# Patient Record
Sex: Female | Born: 1950 | Race: White | Hispanic: No | Marital: Married | Smoking: Never smoker
Health system: Southern US, Community
[De-identification: ages and names within clinical notes are randomized; demographics above are authoritative.]

## PROBLEM LIST (undated history)

## (undated) DIAGNOSIS — G809 Cerebral palsy, unspecified: Secondary | ICD-10-CM

## (undated) DIAGNOSIS — I509 Heart failure, unspecified: Secondary | ICD-10-CM

## (undated) DIAGNOSIS — E119 Type 2 diabetes mellitus without complications: Secondary | ICD-10-CM

## (undated) DIAGNOSIS — I10 Essential (primary) hypertension: Secondary | ICD-10-CM

## (undated) HISTORY — PX: MOUTH SURGERY: SHX715

## (undated) HISTORY — PX: ABDOMINAL HYSTERECTOMY: SHX81

## (undated) HISTORY — PX: OTHER SURGICAL HISTORY: SHX169

---

## 1998-01-05 ENCOUNTER — Other Ambulatory Visit: Admission: RE | Admit: 1998-01-05 | Discharge: 1998-01-05 | Payer: Self-pay | Admitting: Family Medicine

## 1998-01-11 ENCOUNTER — Emergency Department (HOSPITAL_COMMUNITY): Admission: EM | Admit: 1998-01-11 | Discharge: 1998-01-11 | Payer: Self-pay | Admitting: Emergency Medicine

## 1998-01-16 ENCOUNTER — Other Ambulatory Visit: Admission: RE | Admit: 1998-01-16 | Discharge: 1998-01-16 | Payer: Self-pay | Admitting: Family Medicine

## 1998-03-13 ENCOUNTER — Ambulatory Visit (HOSPITAL_COMMUNITY): Admission: RE | Admit: 1998-03-13 | Discharge: 1998-03-13 | Payer: Self-pay | Admitting: Family Medicine

## 1998-05-25 ENCOUNTER — Other Ambulatory Visit: Admission: RE | Admit: 1998-05-25 | Discharge: 1998-05-25 | Payer: Self-pay | Admitting: *Deleted

## 1998-05-28 ENCOUNTER — Encounter: Admission: RE | Admit: 1998-05-28 | Discharge: 1998-05-28 | Payer: Self-pay | Admitting: Obstetrics

## 1998-05-28 ENCOUNTER — Other Ambulatory Visit: Admission: RE | Admit: 1998-05-28 | Discharge: 1998-05-28 | Payer: Self-pay | Admitting: Obstetrics

## 1998-06-25 ENCOUNTER — Encounter: Admission: RE | Admit: 1998-06-25 | Discharge: 1998-06-25 | Payer: Self-pay | Admitting: Obstetrics

## 1998-07-07 ENCOUNTER — Ambulatory Visit (HOSPITAL_COMMUNITY): Admission: RE | Admit: 1998-07-07 | Discharge: 1998-07-07 | Payer: Self-pay | Admitting: Family Medicine

## 1998-07-07 ENCOUNTER — Encounter: Payer: Self-pay | Admitting: Family Medicine

## 1998-09-19 ENCOUNTER — Ambulatory Visit: Admission: RE | Admit: 1998-09-19 | Discharge: 1998-09-19 | Payer: Self-pay | Admitting: Otolaryngology

## 1998-11-09 ENCOUNTER — Ambulatory Visit: Admission: RE | Admit: 1998-11-09 | Discharge: 1998-11-09 | Payer: Self-pay | Admitting: Otolaryngology

## 1998-12-30 ENCOUNTER — Emergency Department (HOSPITAL_COMMUNITY): Admission: EM | Admit: 1998-12-30 | Discharge: 1998-12-30 | Payer: Self-pay | Admitting: Emergency Medicine

## 1998-12-30 ENCOUNTER — Encounter: Payer: Self-pay | Admitting: Emergency Medicine

## 2002-01-04 ENCOUNTER — Ambulatory Visit (HOSPITAL_COMMUNITY): Admission: RE | Admit: 2002-01-04 | Discharge: 2002-01-04 | Payer: Self-pay | Admitting: Neurology

## 2002-01-04 ENCOUNTER — Encounter: Payer: Self-pay | Admitting: Neurology

## 2003-01-02 ENCOUNTER — Other Ambulatory Visit: Admission: RE | Admit: 2003-01-02 | Discharge: 2003-01-02 | Payer: Self-pay | Admitting: Family Medicine

## 2003-01-02 ENCOUNTER — Ambulatory Visit (HOSPITAL_COMMUNITY): Admission: RE | Admit: 2003-01-02 | Discharge: 2003-01-02 | Payer: Self-pay | Admitting: Family Medicine

## 2003-10-07 ENCOUNTER — Encounter (INDEPENDENT_AMBULATORY_CARE_PROVIDER_SITE_OTHER): Payer: Self-pay | Admitting: Interventional Cardiology

## 2003-10-07 ENCOUNTER — Ambulatory Visit (HOSPITAL_COMMUNITY): Admission: RE | Admit: 2003-10-07 | Discharge: 2003-10-07 | Payer: Self-pay | Admitting: Family Medicine

## 2003-11-21 ENCOUNTER — Ambulatory Visit (HOSPITAL_COMMUNITY): Admission: RE | Admit: 2003-11-21 | Discharge: 2003-11-21 | Payer: Self-pay | Admitting: Obstetrics and Gynecology

## 2003-12-19 ENCOUNTER — Ambulatory Visit (HOSPITAL_COMMUNITY): Admission: RE | Admit: 2003-12-19 | Discharge: 2003-12-19 | Payer: Self-pay | Admitting: Family Medicine

## 2003-12-30 ENCOUNTER — Ambulatory Visit (HOSPITAL_COMMUNITY): Admission: RE | Admit: 2003-12-30 | Discharge: 2003-12-30 | Payer: Self-pay | Admitting: Cardiology

## 2004-01-01 ENCOUNTER — Observation Stay (HOSPITAL_COMMUNITY): Admission: RE | Admit: 2004-01-01 | Discharge: 2004-01-02 | Payer: Self-pay | Admitting: Obstetrics and Gynecology

## 2004-01-01 ENCOUNTER — Encounter (INDEPENDENT_AMBULATORY_CARE_PROVIDER_SITE_OTHER): Payer: Self-pay | Admitting: Specialist

## 2004-01-21 ENCOUNTER — Ambulatory Visit (HOSPITAL_BASED_OUTPATIENT_CLINIC_OR_DEPARTMENT_OTHER): Admission: RE | Admit: 2004-01-21 | Discharge: 2004-01-21 | Payer: Self-pay | Admitting: Family Medicine

## 2004-07-12 ENCOUNTER — Emergency Department (HOSPITAL_COMMUNITY): Admission: EM | Admit: 2004-07-12 | Discharge: 2004-07-12 | Payer: Self-pay | Admitting: Emergency Medicine

## 2004-11-29 ENCOUNTER — Ambulatory Visit: Payer: Self-pay | Admitting: Family Medicine

## 2004-12-13 ENCOUNTER — Ambulatory Visit: Payer: Self-pay | Admitting: Family Medicine

## 2005-02-14 ENCOUNTER — Ambulatory Visit: Payer: Self-pay | Admitting: Family Medicine

## 2008-12-12 ENCOUNTER — Emergency Department (HOSPITAL_COMMUNITY): Admission: EM | Admit: 2008-12-12 | Discharge: 2008-12-12 | Payer: Self-pay | Admitting: Emergency Medicine

## 2010-03-31 ENCOUNTER — Emergency Department (HOSPITAL_COMMUNITY): Admission: EM | Admit: 2010-03-31 | Discharge: 2010-03-31 | Payer: Self-pay | Admitting: Emergency Medicine

## 2010-04-01 ENCOUNTER — Observation Stay (HOSPITAL_COMMUNITY): Admission: EM | Admit: 2010-04-01 | Discharge: 2010-04-01 | Payer: Self-pay | Admitting: Emergency Medicine

## 2010-04-26 ENCOUNTER — Emergency Department (HOSPITAL_COMMUNITY): Admission: EM | Admit: 2010-04-26 | Discharge: 2010-04-26 | Payer: Self-pay | Admitting: Emergency Medicine

## 2010-07-29 ENCOUNTER — Encounter: Admission: RE | Admit: 2010-07-29 | Payer: Self-pay | Source: Home / Self Care | Admitting: Neurology

## 2010-10-21 LAB — CBC
MCH: 32.8 pg (ref 26.0–34.0)
MCH: 32.8 pg (ref 26.0–34.0)
MCH: 33 pg (ref 26.0–34.0)
MCHC: 33.7 g/dL (ref 30.0–36.0)
MCHC: 33.9 g/dL (ref 30.0–36.0)
MCHC: 34 g/dL (ref 30.0–36.0)
MCV: 96.4 fL (ref 78.0–100.0)
Platelets: 205 10*3/uL (ref 150–400)
Platelets: 217 10*3/uL (ref 150–400)
RBC: 3.91 MIL/uL (ref 3.87–5.11)
RBC: 4.05 MIL/uL (ref 3.87–5.11)
RBC: 4.18 MIL/uL (ref 3.87–5.11)
RBC: 4.51 MIL/uL (ref 3.87–5.11)
RDW: 13.1 % (ref 11.5–15.5)
RDW: 13.2 % (ref 11.5–15.5)
WBC: 15.5 10*3/uL — ABNORMAL HIGH (ref 4.0–10.5)
WBC: 7.9 10*3/uL (ref 4.0–10.5)

## 2010-10-21 LAB — URINE CULTURE
Colony Count: 10000
Culture  Setup Time: 201108240546
Culture  Setup Time: 201108251535
Culture  Setup Time: 201109191610

## 2010-10-21 LAB — DIFFERENTIAL
Basophils Relative: 0 % (ref 0–1)
Eosinophils Absolute: 0 10*3/uL (ref 0.0–0.7)
Eosinophils Absolute: 0.1 10*3/uL (ref 0.0–0.7)
Eosinophils Absolute: 0.2 10*3/uL (ref 0.0–0.7)
Eosinophils Relative: 3 % (ref 0–5)
Lymphocytes Relative: 15 % (ref 12–46)
Lymphocytes Relative: 32 % (ref 12–46)
Lymphs Abs: 2.3 10*3/uL (ref 0.7–4.0)
Lymphs Abs: 2.5 10*3/uL (ref 0.7–4.0)
Monocytes Absolute: 0.7 10*3/uL (ref 0.1–1.0)
Monocytes Absolute: 1.9 10*3/uL — ABNORMAL HIGH (ref 0.1–1.0)
Monocytes Relative: 9 % (ref 3–12)
Neutro Abs: 11.2 10*3/uL — ABNORMAL HIGH (ref 1.7–7.7)
Neutro Abs: 4.4 10*3/uL (ref 1.7–7.7)
Neutrophils Relative %: 68 % (ref 43–77)
Neutrophils Relative %: 72 % (ref 43–77)

## 2010-10-21 LAB — COMPREHENSIVE METABOLIC PANEL
BUN: 19 mg/dL (ref 6–23)
CO2: 27 mEq/L (ref 19–32)
Calcium: 9 mg/dL (ref 8.4–10.5)
Chloride: 107 mEq/L (ref 96–112)
GFR calc non Af Amer: >60 mL/min (ref >60)
Potassium: 3.7 mEq/L (ref 3.5–5.1)
Total Bilirubin: 0.5 mg/dL (ref 0.3–1.2)

## 2010-10-21 LAB — GLUCOSE, CAPILLARY
Glucose-Capillary: 115 mg/dL — ABNORMAL HIGH (ref 70–99)
Glucose-Capillary: 121 mg/dL — ABNORMAL HIGH (ref 70–99)

## 2010-10-21 LAB — BASIC METABOLIC PANEL
BUN: 13 mg/dL (ref 6–23)
BUN: 23 mg/dL (ref 6–23)
CO2: 25 mEq/L (ref 19–32)
CO2: 26 mEq/L (ref 19–32)
Calcium: 8.5 mg/dL (ref 8.4–10.5)
Chloride: 94 mEq/L — ABNORMAL LOW (ref 96–112)
Chloride: 98 mEq/L (ref 96–112)
Creatinine, Ser: 0.7 mg/dL (ref 0.4–1.2)
Creatinine, Ser: 0.72 mg/dL (ref 0.4–1.2)
GFR calc Af Amer: >60 mL/min (ref >60)
GFR calc non Af Amer: >60 mL/min (ref >60)
Glucose, Bld: 173 mg/dL — ABNORMAL HIGH (ref 70–99)
Glucose, Bld: 241 mg/dL — ABNORMAL HIGH (ref 70–99)

## 2010-10-21 LAB — POCT I-STAT, CHEM 8
HCT: 42 % (ref 36.0–46.0)
Hemoglobin: 14.3 g/dL (ref 12.0–15.0)
Potassium: 3.5 mEq/L (ref 3.5–5.1)
Sodium: 131 mEq/L — ABNORMAL LOW (ref 135–145)
TCO2: 25 mmol/L (ref 0–100)

## 2010-10-21 LAB — HEPATIC FUNCTION PANEL
Albumin: 2.7 g/dL — ABNORMAL LOW (ref 3.5–5.2)
Alkaline Phosphatase: 78 U/L (ref 39–117)
Indirect Bilirubin: 0.7 mg/dL (ref 0.3–0.9)
Total Protein: 6.6 g/dL (ref 6.0–8.3)

## 2010-10-21 LAB — POCT CARDIAC MARKERS
CKMB, poc: <1 ng/mL — ABNORMAL LOW (ref 1.0–8.0)
Myoglobin, poc: 150 ng/mL (ref 12–200)
Myoglobin, poc: 340 ng/mL (ref 12–200)

## 2010-10-21 LAB — URINALYSIS, ROUTINE W REFLEX MICROSCOPIC
Bilirubin Urine: NEGATIVE
Bilirubin Urine: NEGATIVE
Bilirubin Urine: NEGATIVE
Glucose, UA: 100 mg/dL — AB
Glucose, UA: NEGATIVE mg/dL
Glucose, UA: NEGATIVE mg/dL
Hgb urine dipstick: NEGATIVE
Hgb urine dipstick: NEGATIVE
Ketones, ur: NEGATIVE mg/dL
Nitrite: POSITIVE — AB
Nitrite: POSITIVE — AB
Protein, ur: 100 mg/dL — AB
Protein, ur: 100 mg/dL — AB
Protein, ur: 30 mg/dL — AB
Protein, ur: 30 mg/dL — AB
Specific Gravity, Urine: 1.021 (ref 1.005–1.030)
Urobilinogen, UA: 0.2 mg/dL (ref 0.0–1.0)
Urobilinogen, UA: 1 mg/dL (ref 0.0–1.0)

## 2010-10-21 LAB — URINE MICROSCOPIC-ADD ON

## 2010-10-21 LAB — BRAIN NATRIURETIC PEPTIDE: Pro B Natriuretic peptide (BNP): <30 pg/mL (ref 0.0–100.0)

## 2010-10-21 LAB — LIPASE, BLOOD: Lipase: 23 U/L (ref 11–59)

## 2010-11-16 LAB — COMPREHENSIVE METABOLIC PANEL
AST: 21 U/L (ref 0–37)
Albumin: 3.5 g/dL (ref 3.5–5.2)
Alkaline Phosphatase: 112 U/L (ref 39–117)
BUN: 15 mg/dL (ref 6–23)
Chloride: 105 mEq/L (ref 96–112)
GFR calc Af Amer: >60 mL/min (ref >60)
Potassium: 3.6 mEq/L (ref 3.5–5.1)
Total Bilirubin: 0.6 mg/dL (ref 0.3–1.2)
Total Protein: 7.3 g/dL (ref 6.0–8.3)

## 2010-11-16 LAB — DIFFERENTIAL
Basophils Absolute: 0 10*3/uL (ref 0.0–0.1)
Basophils Relative: 0 % (ref 0–1)
Eosinophils Relative: 3 % (ref 0–5)
Lymphocytes Relative: 24 % (ref 12–46)
Monocytes Absolute: 0.8 10*3/uL (ref 0.1–1.0)
Monocytes Relative: 11 % (ref 3–12)
Neutro Abs: 4.4 10*3/uL (ref 1.7–7.7)

## 2010-11-16 LAB — POCT CARDIAC MARKERS
CKMB, poc: <1 ng/mL — ABNORMAL LOW (ref 1.0–8.0)
Troponin i, poc: <0.05 ng/mL (ref 0.00–0.09)

## 2010-11-16 LAB — CBC
Platelets: 278 10*3/uL (ref 150–400)
RDW: 13.6 % (ref 11.5–15.5)
WBC: 7.1 10*3/uL (ref 4.0–10.5)

## 2010-11-16 LAB — APTT: aPTT: 29 seconds (ref 24–37)

## 2010-12-24 NOTE — Cardiovascular Report (Signed)
NAME:  Destiny Cole, Destiny Cole                        ACCOUNT NO.:  [id number]   MEDICAL RECORD NO.:  [id number]                   PATIENT TYPE:  OIB   LOCATION:  2899                                 FACILITY:  [facility]   PHYSICIAN:  Armanda Magic, M.D.                  DATE OF BIRTH:  September 18, 1950   DATE OF PROCEDURE:  12/30/2003  DATE OF DISCHARGE:  12/30/2003                              CARDIAC CATHETERIZATION   PROCEDURE:  Left heart catheterization and coronary angiography, left  ventriculography.   SURGEON:  Armanda Magic, M.D.   REFERRING PHYSICIAN:  Turkey R. Rankins, M.D.   INDICATIONS:  Shortness of breath and abnormal Cardiolite.   COMPLICATIONS:  None.   INTRAVENOUS ACCESS:  IV access through a right femoral artery 6 French  sheath.   INDICATIONS:  This is a 60 year old morbidly obese white female who has been  having problems with shortness of breath and atypical chest pain.  She is a  previous Phen-Phen user and 2-D echocardiogram was essentially normal except  for mild LVH.  She had an abnormal Adenosine Cardiolite study that showed an  anterior wall defect that was reversible and now presents for heart  catheterization for preop clearance prior to hysterectomy.   DESCRIPTION OF PROCEDURE:  The patient was brought to the cardiac  catheterization laboratory in the fasting nonsedated state.  Informed  consent was obtained.  The patient was connected to continuous heart rate  and pulse oximetry monitoring and intermittent blood pressure monitoring,  intermittent blood pressure monitoring.  Her right groin was prepped and  draped in the sterile fashion.  1% Xylocaine was used for local anesthesia.  Using the modified Seldinger technique, a 6 French sheath was placed in the  right femoral artery.  Under fluoroscopic guidance a 6 [place] JL4 catheter  was placed in the left coronary artery.  Multiple cine films were taken in a  30 degree RAO and 40 degree LAO views.  The  catheter was then exchanged out  over a guidewire for a 6 [place] JL4 catheter which was placed under  fluoroscopic guidance in the right coronary artery.  Multiple cine films  were taken in 30 degree RAO and 40 degree LAO views.  This catheter was then  removed over a guidewire and a 6 French angled pigtail catheter was placed  in the left ventricular cavity under fluoroscopic guidance.  Left  ventriculography was performed in the 30 degree RAO views, and a total of 30  mL of contrast at 15 mL per second.  The catheter was pulled back across the  aortic valve and significant gradient was noted at the end of the procedure.  All catheters and sheaths were removed.  Manual compression was performed  until adequate hemostasis was obtained.  The patient was transferred back to  her room in stable condition.   RESULTS:  1. The left main coronary artery is widely patent  and bifurcates into a left     anterior descending artery and left circumflex artery.  2. The left anterior descending artery is widely patent throughout its     course to the apex giving rise to two diagonal branches both of which are     widely patent.  3. The left circumflex is widely patent and traverses the AV groove.  It     gives rise to a very large obtuse marginal branch which trifurcates     distally and is widely patent.  It gives rise to a second obtuse marginal     branch which is very small and patent.  4. The right coronary artery is widely patent throughout its course and     bifurcates distally into the posterior descending artery and posterior     lateral artery.   LEFT VENTRICULOGRAPHY:  The left ventriculography shows normal left  ventricular systolic function.  LV pressure 124/16 mmHg.  Aortic pressure  124/48 mmHg.  LVEDP is 18 mmHg.   ASSESSMENT:  1. Shortness of breath most likely secondary to morbid obesity.  2. Normal coronary arteries.  3. Normal left ventricular function.   PLAN:  Discharge to  home after bed rest and IV fluids.  Follow up with Dr.  Mayford Knife in 2 weeks for groin check.  She is cleared for her hysterectomy.                                               Armanda Magic, M.D.    TT/MEDQ  D:  12/30/2003  T:  12/31/2003  Job:  [id number]   cc:   Fanny Dance. Rankins, M.D.  [address]  [place]  [place] [address]  Fax: [phone]

## 2010-12-24 NOTE — H&P (Signed)
NAME:  Destiny Cole, Destiny Cole                          ACCOUNT NO.:  [id number]   MEDICAL RECORD NO.:  [id number]                   PATIENT TYPE:   LOCATION:                                       FACILITY:   PHYSICIAN:  Osborn Coho, M.D.                DATE OF BIRTH:   DATE OF ADMISSION:  DATE OF DISCHARGE:                                HISTORY & PHYSICAL   CHIEF COMPLAINT:  Postmenopausal bleeding.   HISTORY:  Destiny Cole is a 60 year old gravida 3 para 3-0-0-2 with last  menstrual period May 2002 with postmenopausal bleeding that started in the  middle of April 2005.  The workup for her postmenopausal bleeding revealed  complex hyperplasia with atypia.  The patient reports only some spotting for  the last 3 days on her May 19 visit but prior to that time she had some  bleeding like a period.  She denies any significant weight changes or change  in appetite or bowel movements.   PAST OBSTETRICAL HISTORY:  Normal spontaneous vaginal delivery full-term x3  (one was stillborn).   PAST GYNECOLOGICAL HISTORY:  History of regular menses prior to menopause.  Postmenopausal since May 2002, never used hormonal replacement therapy, and  stopped having hot flashes about 3 months ago.  The patient denies any  history of gonorrhea, chlamydia, abnormal Pap smears.  Her last mammogram  was in May 2004 and was normal per patient report.   PAST MEDICAL HISTORY:  1. Hypertension.  2. Diabetes.  3. GERD.  4. Hypercholesterolemia.  5. Morbid obesity.  6. Depression.  7. Cerebral palsy (affecting right side).  8. History of kidney stones.   MEDICINES:  Insulin, Glucophage, Actos, amitriptyline, omeprazole, Altace,  Prozac, furosemide, potassium chloride, Lipitor.  A medication sheet will be  included with her record.   PAST SURGICAL HISTORY:  1. Surgery on tendons of the right lower extremity and right wrist secondary     to cerebral palsy.  2. Lithotripsy 12 years ago secondary to history of  kidney stones.  3. Laparoscopic bilateral tubal ligation.   ALLERGIES:  SULFA (hives).   SOCIAL HISTORY:  Denies cigarette use, alcohol use, or drug use.  The  patient lives alone.   FAMILY HISTORY:  Hypertension, diabetes, heart disease on both sides of the  family.  Mother had breast cancer and lung cancer and bipolar disorder.   REVIEW OF SYMPTOMS:  Denies chest pain, fever, chills, nausea, vomiting,  diarrhea, or genitourinary problems.  The patient does report a history of  shortness of breath upon walking distances but no chest pain.   PHYSICAL EXAMINATION:  HEENT:  Within normal limits, thyroid not enlarged.  HEART:  Rate and rhythm are regular.  CHEST:  Clear to auscultation bilaterally.  BREASTS:  No masses, discharge, skin changes, or nipple retraction.  BACK:  No CVA tenderness.  ABDOMEN:  Obese and nontender, no palpable masses.  EXTREMITIES:  No calf tenderness.  There is some slight difficulty moving  the right leg secondary to cerebral palsy.  PELVIC:  External genitalia within normal limits.  Cervix nontender.  Uterus  difficult to palpate on exam secondary to habitus.   IMAGING AND STUDIES:  Endometrial biopsy on November 26, 2003 revealed simple  and complex hyperplasia with atypia.  Ultrasound on November 21, 2003 revealed  a uterus measuring 7.4 x 5.0 x 4.1 cm.  The endometrial lining was thickened  measuring 1.3 cm.  The left ovary contained a small simple cyst measuring  1.6 x 0.9 x 1.3 cm.  The right ovary was not directly visualized but there  were no adnexal masses noted.  No free fluid.   ASSESSMENT AND PLAN:  Destiny Cole is a 60 year old gravida 3 para 2 with  postmenopausal bleeding, found to have complex hyperplasia with atypia.  The  patient is scheduled to undergo a hysterectomy with possible bilateral  salpingo-oophorectomy on Jan 01, 2004.  The risks, benefits, and  alternatives including but not limited to bleeding, infection, and injury  have been  discussed with the patient.  The patient verbalized understanding  and consent signed and witnessed.  Preoperative labs to be done.  Medical  clearance has been obtained from the patient's primary medical doctor, Dr.  Luciana Axe, and her cardiologist, Dr. Mayford Knife.  The patient's endocrinologist is  Dr. Leslie Dales.                                               Osborn Coho, M.D.    AR/MEDQ  D:  01/01/2004  T:  01/01/2004  Job:  [id number]

## 2010-12-24 NOTE — H&P (Signed)
NAME:  Destiny Cole, Destiny Cole                        ACCOUNT NO.:  [id number]   MEDICAL RECORD NO.:  [id number]                   PATIENT TYPE:  OIB   LOCATION:  2899                                 FACILITY:  [facility]   PHYSICIAN:  Armanda Magic, M.D.                  DATE OF BIRTH:  12/29/1950   DATE OF ADMISSION:  12/30/2003  DATE OF DISCHARGE:  12/30/2003                                HISTORY & PHYSICAL   PRIMARY CARE PHYSICIAN:  Jillyn Hidden A. Rankin, M.D.   CHIEF COMPLAINT:  Abnormal Cardiolite study.   HISTORY OF PRESENT ILLNESS:  Destiny Cole is a 60 year old morbidly obese  female with prior history of Fen-Phen use, was initially evaluated by Dr.  Mayford Knife in the office in March of 2005, due to complaints of atypical chest  pain under her left arm, sharp shooting pain, duration a few seconds and is  gone without radiation.  Usually occurs with exertion, has increased in  frequency over the past eight months prior to her evaluation.  Her only  other problem is related to lower extremity edema.  Because of her risk  factors of obesity, hypertension and diabetes Dr. Mayford Knife ordered an  adenosine Cardiolite study.  This study did reveal an area of reversibility  in the anterior wall consistent with LAD stenosis and anterior wall ischemia  with normal LV function.  Based on the results of that study, Dr. Mayford Knife has  recommended diagnostic coronary angiography to be done at [facility]. [facility].   REVIEW OF SYMPTOMS:  Please refer to history of present illness.  She does  have occasional headaches and some difficulty swallowing and dizzy spells  without visual disturbances.  She has right leg pain, swelling in her feet,  occasional cough, back ache, weakness, chronic paralysis in the right side  due to her cerebral palsy and problems with depression and anxiety and  worrying.   ALLERGIES:  SULFA.   CURRENT MEDICATIONS:  1. Amitriptyline 25 mg daily.  2. Altace 10 mg daily.  3. K-Dur  10 mEq b.i.d.  4. Lasix 80 mg b.i.d.  5. Actos 45 mg daily.  6. Omeprazole 20 mg daily.  7. Lipitor 40 mg daily.  8. Glucophage 500 mg t.i.d.  9. Amaryl 4 mg two tablets q.a.m.  10.      Lantus insulin 100 units __________.  11.      Humalog sliding scale insulin.  12.      Aspirin.  13.      CPAP at night.   PAST MEDICAL HISTORY:  1. Hypertension.  2. Diabetes mellitus.  3. Dyslipidemia.  4. Cerebral palsy affecting the right side.  5. Left ventricular hypertrophy per echo 2005.  6. Atypical chest pain with abnormal Cardiolite study.   PAST SURGICAL HISTORY:  1. Multiple surgeries to right leg and arm secondary to cerebral palsy     issues.  2.  She has had prior tubal ligation as well.   FAMILY HISTORY:  Significant for coronary artery disease.  Father is 28 and  has had three MIs.  Mother has history of breast cancer, hypertension and  cerebral palsy.  She is 75.  Brother with history of lung disease secondary  to smoking.  He is 57.   SOCIAL HISTORY:  Married with two children, all in good health. No history  of prior tobacco or alcohol use.  She does drink two cups of coffee per day.  She does not work outside the home.   PHYSICAL EXAMINATION:  GENERAL APPEARANCE:  A well-developed but morbidly  obese female currently in no acute distress.  VITAL SIGNS:  Blood pressure 170/110, heart rate 78 and regular, weight 278  pounds.  HEENT:  Normocephalic.  NECK:  Neck is supple with no adenopathy.  LUNGS:  Clear to auscultation.  Respiratory effort is nonlabored.  CARDIOVASCULAR:  S1 and S2, no rubs, murmurs, thrills, no gallops.  No JVD.  Carotids 2+ bilaterally without bruits.  ABDOMEN:  Soft and nontender, nondistended with active bowel sounds.  Obese.  EXTREMITIES:  No edema bilaterally.  Peripheral pulses are palpable at 1 to  2+ radial, femoral and pedal.   EKG is from 2002 and shows normal sinus rhythm without ST changes.  Normal  axis.   Adenosine Cardiolite  study again revealed area of reversibility in the  anterior wall consistent with LAD stenosis and anterior wall ischemia with  normal LV function.   ASSESSMENT:  1. Shortness of breath and recent chest pain now with abnormal Cardiolite     study.  2. Morbid obesity.  3. Hypertension.  4. Dyslipidemia.  5. Diabetes mellitus.  6. Prior history of fen-Phen use, most recent 2-D echocardiogram  revealing     only left ventricular hypertrophy, no pulmonary hypertension.   PLAN:  The patient is to proceed with diagnostic coronary angiography on Dec 30, 2003, for Dr. Mayford Knife at [facility]. [facility]  Risks and  benefits of this procedure have been explained to the patient including risk  of myocardial infarction, cerebrovascular accident, death, bleeding  complications, IV contrast dye allergy, pseudoaneurysm, AV fistula, renal  failure, etc.  The patient verbalizes understanding of these risks and  wishes to proceed.      Allison L. Ulla Gallo, M.D.    ALE/MEDQ  D:  03/25/2004  T:  03/25/2004  Job:  [id number]   cc:   Jillyn Hidden A. Rankin, M.D.  [address]  [place]  [place] [address]  Fax: [phone]

## 2010-12-24 NOTE — Op Note (Signed)
NAME:  Destiny Cole, Destiny Cole                        ACCOUNT NO.:  [id number]   MEDICAL RECORD NO.:  [id number]                   PATIENT TYPE:  OBV   LOCATION:  9319                                 FACILITY:  [facility]   PHYSICIAN:  Osborn Coho, M.D.                DATE OF BIRTH:  01-26-1951   DATE OF PROCEDURE:  01/01/2004  DATE OF DISCHARGE:  01/02/2004                                 OPERATIVE REPORT   PREOPERATIVE DIAGNOSIS:  1. Post menopausal bleeding.  2. Endometrial complex hypoplasia with atypia.   POSTOPERATIVE DIAGNOSIS:  1. Post menopausal bleeding.  2. Endometrial complex hypoplasia with atypia.   PROCEDURE:  Total vaginal hysterectomy.   ANESTHESIA:  Epidural.   ATTENDING PHYSICIAN:  Osborn Coho, M.D.   ASSISTANT:  Marquis Lunch. Powell, P.A.-C.   ESTIMATED BLOOD LOSS:  400 mL.   FLUIDS REPLACED:  1700 mL.   URINE OUTPUT:  200 mL.   FINDINGS:  Normal size uterus with normal appearing bilateral ovaries and  tubes and the left ovary had a less than 1 cm small hemorrhagic cyst.   COMPLICATIONS:  None.   DESCRIPTION OF PROCEDURE:  The patient was taken to the operating room after  the risks, benefits and alternatives discussed with the patient.  The  patient verbalized understanding and consent signed and witnessed.  The  patient had been given an epidural per anesthesia and it was found to be  adequate.  The patient was prepped and draped in the normal sterile fashion  in the dorsal lithotomy position.  A weighted speculum was placed in the  vagina and vaginal wall retractors placed to visualize the cervix.  The  cervix was very high in the vaginal vault and had no descensus.  The long  Bovie tip, after the cervix was injected with a total of 20 mL of dilute  Pitressin, the cervix was circumscribed with the Bovie.  The posterior cul-  de-sac was then entered.  A Heaney clamp was used bilaterally on the  uterosacrals which were clamped, cut, and suture ligated.  The  gyrus  ligature was then used along the paracervical tissue bilaterally for several  bites.  The ligature was used and the tissue was transected.  Hemostasis was  noted.  At the utero-ovarian pedicles, they were clamped with the Heaney  clamps bilaterally, transected, and suture ligated.  The ovaries were  difficult to reach vaginally and the decision was made, at that time, to  leave the ovaries which had been discussed with the patient prior to the  procedure.  The ovaries and fallopian tubes appeared normal and the findings  were as mentioned above.  The pedicles were noted to be hemostatic.  The  vaginal cuff was  closed with 0 Vicryl interrupted sutures.  The vagina was packed with  Estrogen packing.  The sponge, lap, and needle counts were correct.  The  patient tolerated the procedure well  and was returned to the recovery room  in stable condition.                                               Osborn Coho, M.D.    AR/MEDQ  D:  01/06/2004  T:  01/06/2004  Job:  [id number]

## 2010-12-24 NOTE — Discharge Summary (Signed)
NAME:  Destiny Cole, Destiny Cole                        ACCOUNT NO.:  [id number]   MEDICAL RECORD NO.:  [id number]                   PATIENT TYPE:  OBV   LOCATION:  9319                                 FACILITY:  [facility]   PHYSICIAN:  Osborn Coho, M.D.                DATE OF BIRTH:  04/16/1951   DATE OF ADMISSION:  01/01/2004  DATE OF DISCHARGE:  01/02/2004                                 DISCHARGE SUMMARY   DISCHARGE DIAGNOSES:  1. Postmenopausal bleeding.  2. Complex endometrial hyperplasia with atypia.  3. Endocervical polyp.   OPERATION:  On the date of admission the patient underwent a total vaginal  hysterectomy, tolerated the procedure well.  The patient was found to have a  normal-size uterus with normal-appearing tubes and ovaries bilaterally.   HISTORY OF PRESENT ILLNESS:  Destiny Cole is a 60 year old female gravida 3  para 3-0-0-2 who presents for vaginal hysterectomy because of complex  hyperplasia with atypia and postmenopausal bleeding.  Please see the  patient's dictated History and Physical Examination for details.   PREOPERATIVE PHYSICAL EXAMINATION:  GENERAL:  Within normal limits.  PELVIC:  External genitalia within normal limits.  Cervix is nontender.  Uterus difficult to palpate on exam secondary to body habitus.   HOSPITAL COURSE:  On the date of admission the patient underwent  aforementioned procedure, tolerating it well.  Postoperative course was  unremarkable with the patient's postoperative hemoglobin being 11.7  (preoperative hemoglobin 13.4).  By postoperative day #1 the patient had  resumed bowel and bladder function and was therefore deemed ready for  discharge home.   DISCHARGE MEDICATIONS:  1. Motrin 600 mg one tablet with food q.6h. as needed for pain.  2. Vicodin one to two tablets q.4-6h. as needed for pain.  3. Phenergan 25 mg one tablet q.6h. as needed for nausea.   FOLLOW-UP:  The patient is to call [facility] to schedule a 1-  week and  a 6-week follow-up visit with Dr. Osborn Coho.   DISCHARGE INSTRUCTIONS:  The patient was advised to call Dr. Su Hilt with a  fever greater than or equal to 100.4 degrees Fahrenheit orally, severe  abdominal pain, persistent nausea.  She was further advised to place nothing  in her vagina for 6 weeks, that she may ambulate as tolerated.  Her diet is  to be a regular ADA diet.   FINAL PATHOLOGY:  Uterus and cervix:  Cervix:  Benign endocervical polyp, no  dysplasia or malignancy identified.  Endometrium:  Simple and complex  hyperplasia with a foci of atypia.  No invasive carcinoma identified.  Benign myometrium and uterine serosa.     Destiny Cole.                    Osborn Coho, M.D.    EJP/MEDQ  D:  01/08/2004  T:  01/09/2004  Job:  [id number]

## 2012-10-28 ENCOUNTER — Inpatient Hospital Stay (HOSPITAL_COMMUNITY): Payer: Medicaid Other

## 2012-10-28 ENCOUNTER — Encounter (HOSPITAL_COMMUNITY): Payer: Self-pay | Admitting: *Deleted

## 2012-10-28 ENCOUNTER — Inpatient Hospital Stay (HOSPITAL_COMMUNITY)
Admission: EM | Admit: 2012-10-28 | Discharge: 2012-10-30 | DRG: 392 | Disposition: A | Discharge status: Home Health | Payer: Medicaid Other | Attending: Internal Medicine | Admitting: Internal Medicine

## 2012-10-28 DIAGNOSIS — G808 Other cerebral palsy: Secondary | ICD-10-CM | POA: Diagnosis present

## 2012-10-28 DIAGNOSIS — G9389 Other specified disorders of brain: Secondary | ICD-10-CM | POA: Diagnosis present

## 2012-10-28 DIAGNOSIS — R0989 Other specified symptoms and signs involving the circulatory and respiratory systems: Secondary | ICD-10-CM | POA: Diagnosis present

## 2012-10-28 DIAGNOSIS — Z90710 Acquired absence of both cervix and uterus: Secondary | ICD-10-CM

## 2012-10-28 DIAGNOSIS — E785 Hyperlipidemia, unspecified: Secondary | ICD-10-CM | POA: Diagnosis present

## 2012-10-28 DIAGNOSIS — E119 Type 2 diabetes mellitus without complications: Secondary | ICD-10-CM | POA: Diagnosis present

## 2012-10-28 DIAGNOSIS — E860 Dehydration: Secondary | ICD-10-CM

## 2012-10-28 DIAGNOSIS — F3289 Other specified depressive episodes: Secondary | ICD-10-CM | POA: Diagnosis present

## 2012-10-28 DIAGNOSIS — R7309 Other abnormal glucose: Secondary | ICD-10-CM

## 2012-10-28 DIAGNOSIS — I10 Essential (primary) hypertension: Secondary | ICD-10-CM | POA: Diagnosis present

## 2012-10-28 DIAGNOSIS — F329 Major depressive disorder, single episode, unspecified: Secondary | ICD-10-CM | POA: Diagnosis present

## 2012-10-28 DIAGNOSIS — G809 Cerebral palsy, unspecified: Secondary | ICD-10-CM

## 2012-10-28 DIAGNOSIS — E876 Hypokalemia: Secondary | ICD-10-CM

## 2012-10-28 DIAGNOSIS — Z6841 Body Mass Index (BMI) 40.0 and over, adult: Secondary | ICD-10-CM

## 2012-10-28 DIAGNOSIS — G4733 Obstructive sleep apnea (adult) (pediatric): Secondary | ICD-10-CM | POA: Diagnosis present

## 2012-10-28 DIAGNOSIS — R0902 Hypoxemia: Secondary | ICD-10-CM | POA: Diagnosis present

## 2012-10-28 DIAGNOSIS — J9601 Acute respiratory failure with hypoxia: Secondary | ICD-10-CM | POA: Diagnosis not present

## 2012-10-28 DIAGNOSIS — R739 Hyperglycemia, unspecified: Secondary | ICD-10-CM

## 2012-10-28 DIAGNOSIS — Z882 Allergy status to sulfonamides status: Secondary | ICD-10-CM

## 2012-10-28 DIAGNOSIS — F22 Delusional disorders: Secondary | ICD-10-CM | POA: Diagnosis not present

## 2012-10-28 DIAGNOSIS — Z79899 Other long term (current) drug therapy: Secondary | ICD-10-CM

## 2012-10-28 DIAGNOSIS — A088 Other specified intestinal infections: Principal | ICD-10-CM | POA: Diagnosis present

## 2012-10-28 DIAGNOSIS — R1110 Vomiting, unspecified: Secondary | ICD-10-CM | POA: Diagnosis present

## 2012-10-28 HISTORY — DX: Cerebral palsy, unspecified: G80.9

## 2012-10-28 HISTORY — DX: Essential (primary) hypertension: I10

## 2012-10-28 HISTORY — DX: Type 2 diabetes mellitus without complications: E11.9

## 2012-10-28 LAB — GLUCOSE, CAPILLARY
Glucose-Capillary: 337 mg/dL — ABNORMAL HIGH (ref 70–99)
Glucose-Capillary: 352 mg/dL — ABNORMAL HIGH (ref 70–99)
Glucose-Capillary: 303 mg/dL — ABNORMAL HIGH (ref 70–99)
Glucose-Capillary: 306 mg/dL — ABNORMAL HIGH (ref 70–99)
Glucose-Capillary: 277 mg/dL — ABNORMAL HIGH (ref 70–99)
Glucose-Capillary: 244 mg/dL — ABNORMAL HIGH (ref 70–99)

## 2012-10-28 LAB — URINE MICROSCOPIC-ADD ON

## 2012-10-28 LAB — CBC WITH DIFFERENTIAL/PLATELET
Basophils Absolute: 0 10*3/uL (ref 0.0–0.1)
Basophils Relative: 0 % (ref 0–1)
Lymphocytes Relative: 6 % — ABNORMAL LOW (ref 12–46)
MCHC: 35.6 g/dL (ref 30.0–36.0)
Neutro Abs: 8.1 10*3/uL — ABNORMAL HIGH (ref 1.7–7.7)
Neutrophils Relative %: 89 % — ABNORMAL HIGH (ref 43–77)
RDW: 12.9 % (ref 11.5–15.5)
WBC: 9.1 10*3/uL (ref 4.0–10.5)

## 2012-10-28 LAB — BASIC METABOLIC PANEL
CO2: 23 mEq/L (ref 19–32)
Chloride: 100 mEq/L (ref 96–112)
GFR calc Af Amer: >90 mL/min (ref >90)
Potassium: 2.5 mEq/L — CL (ref 3.5–5.1)
Sodium: 138 mEq/L (ref 135–145)

## 2012-10-28 LAB — POCT I-STAT, CHEM 8
BUN: 16 mg/dL (ref 6–23)
Chloride: 102 mEq/L (ref 96–112)
Creatinine, Ser: 0.4 mg/dL — ABNORMAL LOW (ref 0.50–1.10)
Potassium: 3.6 mEq/L (ref 3.5–5.1)
Sodium: 137 mEq/L (ref 135–145)

## 2012-10-28 LAB — URINALYSIS, ROUTINE W REFLEX MICROSCOPIC
Ketones, ur: 40 mg/dL — AB
Leukocytes, UA: NEGATIVE
Nitrite: NEGATIVE
Specific Gravity, Urine: 1.038 — ABNORMAL HIGH (ref 1.005–1.030)
pH: 5 (ref 5.0–8.0)

## 2012-10-28 LAB — POCT I-STAT 3, ART BLOOD GAS (G3+)
Acid-base deficit: 1 mmol/L (ref 0.0–2.0)
Bicarbonate: 23.1 mEq/L (ref 20.0–24.0)
pO2, Arterial: 68 mmHg — ABNORMAL LOW (ref 80.0–100.0)

## 2012-10-28 LAB — COMPREHENSIVE METABOLIC PANEL
AST: 16 U/L (ref 0–37)
Albumin: 2.9 g/dL — ABNORMAL LOW (ref 3.5–5.2)
BUN: 14 mg/dL (ref 6–23)
Calcium: 8.4 mg/dL (ref 8.4–10.5)
Chloride: 101 mEq/L (ref 96–112)
Creatinine, Ser: 0.44 mg/dL — ABNORMAL LOW (ref 0.50–1.10)
Total Protein: 6.8 g/dL (ref 6.0–8.3)

## 2012-10-28 LAB — MAGNESIUM: Magnesium: 1.8 mg/dL (ref 1.5–2.5)

## 2012-10-28 LAB — MRSA PCR SCREENING: MRSA by PCR: NEGATIVE

## 2012-10-28 MED ORDER — PROMETHAZINE HCL 25 MG/ML IJ SOLN
25.0000 mg | Freq: Three times a day (TID) | INTRAMUSCULAR | Status: DC | PRN
  Administered 2012-10-28: 25 mg via INTRAVENOUS
  Filled 2012-10-28: qty 1

## 2012-10-28 MED ORDER — ONDANSETRON HCL 4 MG/2ML IJ SOLN
4.0000 mg | Freq: Once | INTRAMUSCULAR | Status: AC
  Administered 2012-10-28: 4 mg via INTRAVENOUS
  Filled 2012-10-28: qty 2

## 2012-10-28 MED ORDER — ACETAMINOPHEN 650 MG RE SUPP: 650.0000 mg | Freq: Four times a day (QID) | RECTAL | Status: DC | PRN

## 2012-10-28 MED ORDER — POTASSIUM CHLORIDE 10 MEQ/100ML IV SOLN
10.0000 meq | INTRAVENOUS | Status: AC
  Administered 2012-10-28 (×2): 10 meq via INTRAVENOUS
  Filled 2012-10-28 (×2): qty 100

## 2012-10-28 MED ORDER — PANTOPRAZOLE SODIUM 40 MG IV SOLR
40.0000 mg | INTRAVENOUS | Status: DC
  Administered 2012-10-28 – 2012-10-29 (×2): 40 mg via INTRAVENOUS
  Filled 2012-10-28 (×4): qty 40

## 2012-10-28 MED ORDER — SODIUM CHLORIDE 0.9 % IV BOLUS (SEPSIS)
1000.0000 mL | Freq: Once | INTRAVENOUS | Status: AC
  Administered 2012-10-28: 1000 mL via INTRAVENOUS

## 2012-10-28 MED ORDER — MAGNESIUM SULFATE 40 MG/ML IJ SOLN
2.0000 g | INTRAMUSCULAR | Status: AC
  Administered 2012-10-28: 2 g via INTRAVENOUS
  Filled 2012-10-28: qty 50

## 2012-10-28 MED ORDER — SODIUM CHLORIDE 0.9 % IV BOLUS (SEPSIS): 1000.0000 mL | Freq: Once | INTRAVENOUS | Status: DC

## 2012-10-28 MED ORDER — FLUOXETINE HCL 20 MG PO TABS
20.0000 mg | ORAL_TABLET | Freq: Every day | ORAL | Status: DC
  Administered 2012-10-28 – 2012-10-30 (×3): 20 mg via ORAL
  Filled 2012-10-28 (×3): qty 1

## 2012-10-28 MED ORDER — HEPARIN SODIUM (PORCINE) 5000 UNIT/ML IJ SOLN: 5000.0000 [IU] | Freq: Three times a day (TID) | INTRAMUSCULAR | Status: DC

## 2012-10-28 MED ORDER — HEPARIN SODIUM (PORCINE) 5000 UNIT/ML IJ SOLN
5000.0000 [IU] | Freq: Three times a day (TID) | INTRAMUSCULAR | Status: DC
  Administered 2012-10-28 – 2012-10-30 (×6): 5000 [IU] via SUBCUTANEOUS
  Filled 2012-10-28 (×10): qty 1

## 2012-10-28 MED ORDER — POLYVINYL ALCOHOL 1.4 % OP SOLN
2.0000 [drp] | Freq: Three times a day (TID) | OPHTHALMIC | Status: DC | PRN
  Administered 2012-10-28: 2 [drp] via OPHTHALMIC
  Filled 2012-10-28: qty 15

## 2012-10-28 MED ORDER — INSULIN ASPART 100 UNIT/ML ~~LOC~~ SOLN
0.0000 [IU] | SUBCUTANEOUS | Status: DC
  Administered 2012-10-28: 8 [IU] via SUBCUTANEOUS
  Administered 2012-10-29: 5 [IU] via SUBCUTANEOUS
  Administered 2012-10-29: 8 [IU] via SUBCUTANEOUS
  Administered 2012-10-29 (×2): 5 [IU] via SUBCUTANEOUS
  Administered 2012-10-29 (×2): 3 [IU] via SUBCUTANEOUS
  Administered 2012-10-30 (×2): 2 [IU] via SUBCUTANEOUS
  Administered 2012-10-30 (×2): 3 [IU] via SUBCUTANEOUS

## 2012-10-28 MED ORDER — ACETAMINOPHEN 650 MG RE SUPP
650.0000 mg | Freq: Once | RECTAL | Status: AC
  Administered 2012-10-28: 650 mg via RECTAL
  Filled 2012-10-28: qty 1

## 2012-10-28 MED ORDER — SODIUM CHLORIDE 0.9 % IV SOLN
INTRAVENOUS | Status: DC
  Administered 2012-10-28 – 2012-10-29 (×5): via INTRAVENOUS

## 2012-10-28 MED ORDER — DOXAZOSIN MESYLATE 2 MG PO TABS
2.0000 mg | ORAL_TABLET | Freq: Every day | ORAL | Status: DC
  Administered 2012-10-29 – 2012-10-30 (×2): 2 mg via ORAL
  Filled 2012-10-28 (×3): qty 1

## 2012-10-28 MED ORDER — RAMIPRIL 10 MG PO CAPS
10.0000 mg | ORAL_CAPSULE | Freq: Every day | ORAL | Status: DC
  Administered 2012-10-29 – 2012-10-30 (×2): 10 mg via ORAL
  Filled 2012-10-28 (×3): qty 1

## 2012-10-28 MED ORDER — INSULIN ASPART 100 UNIT/ML ~~LOC~~ SOLN
10.0000 [IU] | Freq: Once | SUBCUTANEOUS | Status: AC
  Administered 2012-10-28: 10 [IU] via SUBCUTANEOUS
  Filled 2012-10-28: qty 1

## 2012-10-28 MED ORDER — ONDANSETRON HCL 4 MG/2ML IJ SOLN: 4.0000 mg | Freq: Four times a day (QID) | INTRAMUSCULAR | Status: DC | PRN

## 2012-10-28 MED ORDER — INSULIN GLARGINE 100 UNIT/ML ~~LOC~~ SOLN
40.0000 [IU] | Freq: Every day | SUBCUTANEOUS | Status: DC
  Administered 2012-10-28 – 2012-10-29 (×2): 40 [IU] via SUBCUTANEOUS
  Filled 2012-10-28 (×4): qty 0.4

## 2012-10-28 MED ORDER — POTASSIUM CHLORIDE CRYS ER 20 MEQ PO TBCR
40.0000 meq | EXTENDED_RELEASE_TABLET | Freq: Once | ORAL | Status: AC
  Administered 2012-10-28: 40 meq via ORAL
  Filled 2012-10-28: qty 2

## 2012-10-28 MED ORDER — HYPROMELLOSE (GONIOSCOPIC) 2.5 % OP SOLN
2.0000 [drp] | Freq: Three times a day (TID) | OPHTHALMIC | Status: DC | PRN
  Filled 2012-10-28: qty 15

## 2012-10-28 MED ORDER — SODIUM CHLORIDE 0.9 % IJ SOLN
3.0000 mL | Freq: Two times a day (BID) | INTRAMUSCULAR | Status: DC
  Administered 2012-10-29: 3 mL via INTRAVENOUS

## 2012-10-28 MED ORDER — ACETAMINOPHEN 325 MG PO TABS
650.0000 mg | ORAL_TABLET | Freq: Four times a day (QID) | ORAL | Status: DC | PRN
  Administered 2012-10-29: 650 mg via ORAL
  Filled 2012-10-28: qty 2

## 2012-10-28 MED ORDER — SIMVASTATIN 40 MG PO TABS
40.0000 mg | ORAL_TABLET | Freq: Every evening | ORAL | Status: DC
  Administered 2012-10-28 – 2012-10-29 (×2): 40 mg via ORAL
  Filled 2012-10-28 (×4): qty 1

## 2012-10-28 NOTE — ED Notes (Signed)
Pt states she has been vomiting for two days and has not checked her sugar since Tuesday.  Pt states that she is diabetic and has been vomiting and having nausea since tuesday

## 2012-10-28 NOTE — H&P (Signed)
Hospital Admission Note Date: 10/28/2012  Patient name: Destiny Cole Medical record number: [id number] Date of birth: August 05, 1951 Age: 62 y.o. Gender: female PCP: Provider Not In System  Medical Service: Internal Medicine Teaching Service   Attending physician: Dr. Meredith Pel   1st Contact: Dr. Zada Girt    Pager: [phone]  2nd Contact: Dr. Youlanda Roys     Pager: [phone]    After 5 pm or weekends:  1st Contact: Pager: [phone]  2nd Contact: Pager: [phone]     Chief Complaint: Vomiting for 5 days  History of Present Illness: This is a 62 year old woman, with past medical history of hypertension, type 2 diabetes hyperlipidemia, cerebral palsy, and obesity, who presents to the emergency department, with intractable vomiting. Symptoms started 5 days ago. No hematemesis. It is mainly clear fluid. She's been unable to keep anything down. She has also been unable to comply with her medications due to vomiting. She denies symptoms of dizziness, she does not have abdominal pain, or diarrhea. No abdominal distention, and she is having good bowel movements. She denies history of fevers or chills. The intake has been poor since the onset of symptoms. No sick contacts. No myalgias or arthralgia. She thinks that she developed lightheadedness after receiving Zofran. No new medications.  Meds: Current Outpatient Rx  Name  Route  Sig  Dispense  Refill  . doxazosin (CARDURA) 2 MG tablet   Oral   Take 2 mg by mouth daily.         Marland Kitchen FLUoxetine (PROZAC) 20 MG tablet   Oral   Take 20 mg by mouth daily.         . hydroxypropyl methylcellulose (ISOPTO TEARS) 2.5 % ophthalmic solution   Both Eyes   Place 2 drops into both eyes 3 (three) times daily as needed (for dry eyes).         . insulin glargine (LANTUS) 100 UNIT/ML injection   Subcutaneous   Inject 40 Units into the skin at bedtime.         . ramipril (ALTACE) 10 MG capsule   Oral   Take 10 mg by mouth daily.         . simvastatin (ZOCOR)  40 MG tablet   Oral   Take 40 mg by mouth every evening.           Allergies: Allergies as of 10/28/2012 - Review Complete 10/28/2012  Allergen Reaction Noted  . Sulfa antibiotics Swelling and Rash 10/28/2012   Past Medical History  Diagnosis Date  . Diabetes mellitus without complication   . Cerebral palsy   . Hypertension    Past Surgical History  Procedure Laterality Date  . Abdominal hysterectomy    . Mouth surgery    . Cerbral palsy     No family history on file. History   Social History  . Marital Status: Legally Separated    Spouse Name: N/A    Number of Children: N/A  . Years of Education: N/A   Occupational History  . Not on file.   Social History Main Topics  . Smoking status: Never Smoker   . Smokeless tobacco: Not on file  . Alcohol Use: No  . Drug Use: No  . Sexually Active: Not on file   Other Topics Concern  . Not on file   Social History Narrative  . No narrative on file    Review of Systems: Constitutional: positive for malaise and she used to weigh 350 pounds about 2 years ago but  now she weighs about 220 lb. She uses rasiberry ketones for weight loss.  Eyes: negative Ears, nose, mouth, throat, and face: negative for ear drainage, earaches, epistaxis, nasal congestion, snoring, sore throat and voice change Respiratory: negative for asthma, cough, pleurisy/chest pain, pneumonia, stridor and wheezing Cardiovascular: negative for chest pain, chest pressure/discomfort, irregular heart beat, lower extremity edema, near-syncope, orthopnea and palpitations Genitourinary:negative Integument/breast: negative Hematologic/lymphatic: negative Musculoskeletal:negative Neurological: negative for coordination problems, dizziness, gait problems, headaches, paresthesia, seizures, speech problems and weakness Behavioral/Psych: negative Endocrine: negative Allergic/Immunologic: negative  Physical Exam: Blood pressure 143/71, pulse 109, temperature  98.5 F (36.9 C), temperature source Oral, resp. rate 31, height 5' (1.524 m), weight 220 lb (99.791 kg), SpO2 92.00%. General appearance: alert, cooperative, fatigued, moderate distress, morbidly obese and vomits occasionally. Two daughters at bedside. Head: Normocephalic, without obvious abnormality, atraumatic Eyes: conjunctivae/corneas clear. PERRL, EOM's intact. Ears: normal TM's and external ear canals both ears Throat: lips, mucosa, and tongue normal; teeth and gums normal Neck: no adenopathy, no carotid bruit, no JVD, supple, symmetrical, trachea midline and thyroid not enlarged, symmetric, no tenderness/mass/nodules Lungs: clear to auscultation bilaterally and normal percussion bilaterally Heart: regular rate and rhythm, S1, S2 normal, no murmur, click, rub or gallop Abdomen: soft, non-tender; bowel sounds normal; no masses,  no organomegaly Extremities: extremities normal, atraumatic, no cyanosis or edema Pulses: 2+ and symmetric Skin: Skin color, texture, turgor normal. No rashes or lesions Neurologic: Alert and oriented X 3, reduced strength 4/5 on the right UE and right LE due to Cerebral palsy. Sensation is intact. Normal symmetric reflexes.  Lab results: Basic Metabolic Panel:  Recent Labs  65/78/46 0500 10/28/12 0934  NA 138 137  K 2.5* 3.6  CL 100 102  CO2 23  --   GLUCOSE 353* 415*  BUN 18 16  CREATININE 0.41* 0.40*  CALCIUM 8.3*  --    CBC:  Recent Labs  10/28/12 0500 10/28/12 0934  WBC 9.1  --   NEUTROABS 8.1*  --   HGB 12.2 12.6  HCT 34.3* 37.0  MCV 92.0  --   PLT 252  --    CBG:  Recent Labs  10/28/12 0503 10/28/12 1154 10/28/12 1214  GLUCAP 337* 372* 352*   Urine Drug Screen:   Urinalysis:  Recent Labs  10/28/12 0518  COLORURINE YELLOW  LABSPEC 1.038*  PHURINE 5.0  GLUCOSEU >1000*  HGBUR TRACE*  BILIRUBINUR NEGATIVE  KETONESUR 40*  PROTEINUR 30*  UROBILINOGEN 0.2  NITRITE NEGATIVE  LEUKOCYTESUR NEGATIVE   Misc.  Labs: Arterial Blood Gas result:  pO2 68; pCO2 36.4; pH 7.410;  HCO3 23.1, %O2 Sat 94% on room air. Performed at 0537 10/28/2012.  Imaging results:  No results found.  Other results: EKG: ordered  Assessment & Plan by Problem: This is a 62 year old woman, with past medical history of Cerebral palsy since childhood, type 2 diabetes, hypertension, hyperlipidemia, depression, who presents with intractable vomiting for 5 days.  Intractable Emesis: Etiology is not clear at this time. Likely secondary to viral gastritis versus hyperglycemia or metformin. She also thinks that her K-dur pills makes her vomiting worse. Abdominal exam is unremarkable except for obesity and her initial vitals were stable. No leukocytosis. Urinalysis was unremarkable for glucosuria of more than 1000. Other differentials include gastroparesis secondary to diabetes, pancreatitis, mesenteric ischemia, emotional response, hyperthyroidism and depression. Cardiac ischemia also was considered given the patient's risk factors of obesity, DM, and hyoperlipedemia. Plan. -Admit to SDU after a possible reaction to phenergan given in the  ED with followed by a drop in her oxygen saturations in the low 80s and a fever of 104. RR increased to 33. BP remained stable. O2 sats improved in to the upper 90s with supplemental oxygen by nasal cannula. -Blood cultures and urine culture since she spike fevers. -Lactic acid, lipase level, -TSH and free T4 to rule out thyroid storm -EKG, troponin X 1 -Symptomatic relief with Zofran and Phenergan. -IV fluids with normal saline - in the ED she had received 3L of bolus of NS. -Oral intake as tolerable. -We will observe clinically  Hypokalemia: Initial labs in the ED revealed potassium level of 2.5. She was repleted with IV KCl and potassium level improved to 3.6. Likely cause of her hypokalemia is GI losses from vomiting. Plan -BMET and replete as needed.  Increased anion gap: Initial labs  revealed elevated anion gap of 15. Possible causes of this and the need to her is likely from the hyperglycemia and with fasting ketoacidosis. Urinalysis revealed increased ketones. This is likely to resolve with IV fluid rehydration.  Type 2 diabetes: Patient follows up with urgent care and reported. Last A1c in January 2014 was 10%. No records in Epic. Her home regimen includes metformin 850 mg 3 times a day in addition to Lantus 40 units at bedtime. Blood sugars on presentation were elevated up to 353. This degree of hyperglycemia is unlikely to contribute to her symptoms of vomiting.  Plan. -Check A1c. -Lipid panel. -Start her on Lantus and sliding scale. -will hold her Metformin -CBGs q3 hours.  Hyperlipidemia: Home regimen includes simvastatin 40 mg daily. No known lipid profile. Will continue with simvastatin.  Hypertension: Patient's is reportedly hypotensive, and she takes ramipril, with Doxazosin. Blood pressure is currently stable. We will hold her antihypertensive medications in the setting of intractable vomiting.  Obstructive sleep apnea: Patient uses a CPAP machine at home. Will continue with CPAP with caution if she is continues to vomit.  Depression: Patient isn't suicidal at the moment. Will continue with home Prozac 20 mg daily.  Dispo: Disposition is deferred at this time, awaiting improvement of current medical problems. Anticipated discharge in approximately 1-2 day(s).   The patient does have a current PCP (Urgent care at [facility]), therefore is not requiring OPC follow-up after discharge.   The patient does not have transportation limitations that hinder transportation to clinic appointments.  Signed:   Dow Adolph PGY1 - Internal Medicine Teaching Service Pager: [phone] 10/28/2012, 2:24 PM

## 2012-10-28 NOTE — ED Notes (Signed)
Pt requested and was moved to chair.  MD approved prior to transfer.

## 2012-10-28 NOTE — ED Notes (Signed)
cbg of 337 noted md made aware

## 2012-10-28 NOTE — ED Notes (Signed)
Dr Clyde Lundborg and Dr Zada Girt called for temp 104.6 rectal.

## 2012-10-28 NOTE — ED Provider Notes (Signed)
Patient is persistently hyperglycemic, she is receiving insulin, IV fluids, antiemetics and still appears generally weak. She had a fall in the bedroom while trying to transfer from her bed to a bedside commode. At this point with the patient having mild tachycardia to 110 beats per minute and her generalized weakness I feel it is safer for the patient she is admitted to the hospital, discussed with the internal medicine resident who will admit.  Vida Roller, MD 10/28/12 1322

## 2012-10-28 NOTE — ED Provider Notes (Signed)
History     CSN: [id number]  Arrival date & time 10/28/12  0410   First MD Initiated Contact with Patient 10/28/12 [phone]      Chief Complaint  Patient presents with  . Nausea  . Emesis  . Hyperglycemia    (Consider location/radiation/quality/duration/timing/severity/associated sxs/prior treatment) Patient is a 62 y.o. female presenting with vomiting.  Emesis  Pt with history of DM reports several days of nausea and vomiting. She has been able to keep down some fluids at time, but unable to keep down any solids. She has subsequently not been taking her diabetes meds. Denies any fever, no abdominal pain. No dysuria no diarrhea.   Past Medical History  Diagnosis Date  . Diabetes mellitus without complication   . Cerebral palsy   . Hypertension     Past Surgical History  Procedure Laterality Date  . Abdominal hysterectomy    . Mouth surgery    . Cerbral palsy      No family history on file.  History  Substance Use Topics  . Smoking status: Never Smoker   . Smokeless tobacco: Not on file  . Alcohol Use: No    OB History   Grav Para Term Preterm Abortions TAB SAB Ect Mult Living                  Review of Systems  Gastrointestinal: Positive for vomiting.   All other systems reviewed and are negative except as noted in HPI.   Allergies  Sulfa antibiotics  Home Medications   Current Outpatient Rx  Name  Route  Sig  Dispense  Refill  . doxazosin (CARDURA) 2 MG tablet   Oral   Take 2 mg by mouth daily.         Marland Kitchen FLUoxetine (PROZAC) 20 MG tablet   Oral   Take 20 mg by mouth daily.         . hydroxypropyl methylcellulose (ISOPTO TEARS) 2.5 % ophthalmic solution   Both Eyes   Place 2 drops into both eyes 3 (three) times daily as needed (for dry eyes).         . insulin glargine (LANTUS) 100 UNIT/ML injection   Subcutaneous   Inject 40 Units into the skin at bedtime.         . metFORMIN (GLUCOPHAGE) 850 MG tablet   Oral   Take 850 mg by mouth  3 (three) times daily.         . potassium chloride (K-DUR) 10 MEQ tablet   Oral   Take 10 mEq by mouth daily.         . ramipril (ALTACE) 10 MG capsule   Oral   Take 10 mg by mouth daily.         . simvastatin (ZOCOR) 40 MG tablet   Oral   Take 40 mg by mouth every evening.           BP 130/64  Pulse 125  Temp(Src) 98.5 F (36.9 C) (Oral)  Resp 20  Ht 5' (1.524 m)  Wt 220 lb (99.791 kg)  BMI 42.97 kg/m2  SpO2 92%  Physical Exam  Nursing note and vitals reviewed. Constitutional: She is oriented to person, place, and time. She appears well-developed and well-nourished.  HENT:  Head: Normocephalic and atraumatic.  Dry mouth  Eyes: EOM are normal. Pupils are equal, round, and reactive to light.  Neck: Normal range of motion. Neck supple.  Cardiovascular: Normal rate, normal heart sounds and intact distal  pulses.   Pulmonary/Chest: Effort normal and breath sounds normal.  Abdominal: Bowel sounds are normal. She exhibits no distension. There is no tenderness.  Musculoskeletal: Normal range of motion. She exhibits no edema and no tenderness.  Neurological: She is alert and oriented to person, place, and time. She has normal strength. No cranial nerve deficit or sensory deficit.  Skin: Skin is warm and dry. No rash noted.  Psychiatric: She has a normal mood and affect.    ED Course  Procedures (including critical care time)  Labs Reviewed  CBC WITH DIFFERENTIAL - Abnormal; Notable for the following:    RBC 3.73 (*)    HCT 34.3 (*)    Neutrophils Relative 89 (*)    Neutro Abs 8.1 (*)    Lymphocytes Relative 6 (*)    Lymphs Abs 0.5 (*)    All other components within normal limits  GLUCOSE, CAPILLARY - Abnormal; Notable for the following:    Glucose-Capillary 337 (*)    All other components within normal limits  POCT I-STAT 3, BLOOD GAS (G3+) - Abnormal; Notable for the following:    pO2, Arterial 68.0 (*)    All other components within normal limits  BASIC  METABOLIC PANEL  URINALYSIS, ROUTINE W REFLEX MICROSCOPIC   No results found.   1. Vomiting   2. Dehydration   3. Hyperglycemia   4. Hypokalemia       MDM  Hyperglycemia but no acidosis. She is hypokalemic. Will replete in the ED. Care signed out to Dr. Hyacinth Meeker at the change of shift.         Adrianah Prophete B. Bernette Mayers, MD 10/28/12 [phone]

## 2012-10-28 NOTE — Progress Notes (Signed)
Spoke with patient about code status. Patient states that she would like everything (antiarrhythmics, defibrillation, CPR) except intubation. Patient stated that she had "papers but they aren't notarized" Spoke with patient's daughter about code status. Daughter stated that she thought patient wanted everything but intubation and defibrillation. Denton Ar notified and made aware. Stated that she would put in the code order.   Daughter also stated that patient wears CPAP every night. Currently patient's SpO2 is 95%, RR 31. Dr. Collier Bullock made aware. Will put in order for CPAP.   Will continue to monitor.   Rochele Pages, RN

## 2012-10-28 NOTE — Progress Notes (Signed)
**  Interval Progress Note**  Subjective: Pt resting in bed. She states that she has not vomited recently, not having any nausea. Denies having any pain. She does not remember having any history of allergy/reaction to phenergan, just sulfa drugs.  States that she uses CPAP at home.  Of note, patient's daughter spoke with nurse and confirmed that patient does not want intubation or ventilation. Some confusion about whether she would like CPR/shocks.  Objective: Pertinent labs: lactic acid WNL. Anion gap of 14. Troponins negative. CBGs have been high today, last was 244 Vitals: T 99.2, HR 80s, BP 103/42, RR 20's Physical Exam: Patient sleeping in bed, she is easily aroused and answers questions appropriately, alert and oriented x3, but easily falls back asleep.  Pupils 4-36mm, equal, round and reactive to light. Visual acuity grossly intact. No nuchal rigidity. Heart RRR, lungs CTAB, no respiratory distress. Abdomen is obese, soft, nontender. Strength 4/5 right upper extremity (chronic), 5/5 left upper extremity, could not lift legs on command, poor cooperation with exam.    A&P: Patient with fever, vomiting (now improved), and recent reaction to phenergan with drop in O2 sats and RR earlier this afternoon. Her respiratory status has since recovered though she has remained drowsy. Mildly dilated pupils noted on exam, unknown etiology, possible medication effect.  Patient has elevated CBGs with mild anion gap on admission with glucose and ketones in urine, pt may be partially treated DKA. CT head may be beneficial to rule out intracranial process (mass, infection). Vomiting still of unknown etiology, though this has improved, possibly gastritis.  -will obtain head CT as neuro exam is unreliable due to lethargy -will put order in for CPAP -will need to clarify code status in the morning with patient's family and ask for paperwork   Patient seen and evaluated with upper level resident, Dr.  Dorthula Rue.  Denton Ar, MD 10:37 PM

## 2012-10-29 ENCOUNTER — Inpatient Hospital Stay (HOSPITAL_COMMUNITY): Payer: Medicaid Other

## 2012-10-29 DIAGNOSIS — F22 Delusional disorders: Secondary | ICD-10-CM | POA: Diagnosis not present

## 2012-10-29 LAB — CK TOTAL AND CKMB (NOT AT ARMC)
CK, MB: 1.1 ng/mL (ref 0.3–4.0)
Relative Index: INVALID (ref 0.0–2.5)
Total CK: 30 U/L (ref 7–177)

## 2012-10-29 LAB — CBC
HCT: 34.2 % — ABNORMAL LOW (ref 36.0–46.0)
Hemoglobin: 11.6 g/dL — ABNORMAL LOW (ref 12.0–15.0)
MCH: 31.8 pg (ref 26.0–34.0)
MCHC: 33.9 g/dL (ref 30.0–36.0)
RDW: 13.5 % (ref 11.5–15.5)

## 2012-10-29 LAB — BASIC METABOLIC PANEL
BUN: 15 mg/dL (ref 6–23)
Chloride: 108 mEq/L (ref 96–112)
Creatinine, Ser: 0.43 mg/dL — ABNORMAL LOW (ref 0.50–1.10)
GFR calc Af Amer: >90 mL/min (ref >90)
GFR calc non Af Amer: >90 mL/min (ref >90)
Glucose, Bld: 190 mg/dL — ABNORMAL HIGH (ref 70–99)
Potassium: 3.2 mEq/L — ABNORMAL LOW (ref 3.5–5.1)

## 2012-10-29 LAB — MICROALBUMIN / CREATININE URINE RATIO
Creatinine, Urine: 242 mg/dL
Microalb Creat Ratio: 312.7 mg/g — ABNORMAL HIGH (ref 0.0–30.0)
Microalb, Ur: 75.67 mg/dL — ABNORMAL HIGH (ref 0.00–1.89)

## 2012-10-29 LAB — GLUCOSE, CAPILLARY: Glucose-Capillary: 181 mg/dL — ABNORMAL HIGH (ref 70–99)

## 2012-10-29 LAB — LIPID PANEL
Cholesterol: 109 mg/dL (ref 0–200)
HDL: 11 mg/dL — ABNORMAL LOW (ref >39)
LDL Cholesterol: 60 mg/dL (ref 0–99)
Total CHOL/HDL Ratio: 9.9 RATIO
Triglycerides: 192 mg/dL — ABNORMAL HIGH (ref <150)
VLDL: 38 mg/dL (ref 0–40)

## 2012-10-29 LAB — HEMOGLOBIN A1C: Mean Plasma Glucose: 260 mg/dL — ABNORMAL HIGH (ref <117)

## 2012-10-29 LAB — TSH: TSH: 0.676 u[IU]/mL (ref 0.350–4.500)

## 2012-10-29 MED ORDER — POTASSIUM CHLORIDE 10 MEQ/100ML IV SOLN: 10.0000 meq | INTRAVENOUS | Status: DC

## 2012-10-29 MED ORDER — POTASSIUM CHLORIDE CRYS ER 20 MEQ PO TBCR
40.0000 meq | EXTENDED_RELEASE_TABLET | Freq: Once | ORAL | Status: AC
  Administered 2012-10-29: 40 meq via ORAL
  Filled 2012-10-29: qty 2

## 2012-10-29 NOTE — Progress Notes (Signed)
RT placed patient on cpap 10cmH20 via full face mask with 2lpm. Patient is tolerating cpap well at this time. RT will continue to monitor.

## 2012-10-29 NOTE — Progress Notes (Signed)
Utilization review completed.  

## 2012-10-29 NOTE — Progress Notes (Signed)
Subjective:  Patient is feeling better today. She did not have vomiting episodes last nigh. No abdominal pain. She actually reports some cough which started 2 days ago. Afebrile since last temp of 104.6 yesterday afternoon.  Objective: Vital signs in last 24 hours: Filed Vitals:   10/29/12 0006 10/29/12 0027 10/29/12 0326 10/29/12 0800  BP:  125/58 124/55 117/57  Pulse: 96 96 92 90  Temp:  98.8 F (37.1 C) 100.8 F (38.2 C) 98 F (36.7 C)  TempSrc:  Oral Oral Oral  Resp: 16 17 24 24   Height:      Weight:   246 lb 4.1 oz (111.7 kg)   SpO2: 97% 94% 97% 97%   Weight change: 26 lb 4.1 oz (11.909 kg)  Intake/Output Summary (Last 24 hours) at 10/29/12 1109 Last data filed at 10/29/12 0820  Gross per 24 hour  Intake   2360 ml  Output    365 ml  Net   1995 ml   Physical Exam:  General appearance: alert, cooperative, fatigued, no distress, morbidly obese. Patient appears very comfortable but oxygen drops to upper 80s when off O2 for a minute but quickly recovers with 2 L by [place] Lungs: clear to auscultation bilaterally and normal percussion bilaterally  Heart: regular rate and rhythm, S1, S2 normal, no murmur, click, rub or gallop  Abdomen: soft, non-tender; bowel sounds normal; no masses, no organomegaly  Extremities: extremities normal, atraumatic, no cyanosis or edema  Pulses: 2+ and symmetric  Skin: Skin color, texture, turgor normal. No rashes or lesions  Neurologic: Alert and oriented X 3, reduced strength 4/5 on the right UE and right LE due to Cerebral palsy. Sensation is intact  Lab Results: Basic Metabolic Panel:  Recent Labs Lab 10/28/12 1643 10/28/12 1841 10/29/12 0540  NA 137  --  141  K 3.6  --  3.2*  CL 101  --  108  CO2 22  --  24  GLUCOSE 315*  --  190*  BUN 14  --  15  CREATININE 0.44*  --  0.43*  CALCIUM 8.4  --  8.2*  MG  --  1.8  --   PHOS  --  2.6  --    Liver Function Tests:  Recent Labs Lab 10/28/12 1643  AST 16  ALT 14  ALKPHOS 114   BILITOT 0.7  PROT 6.8  ALBUMIN 2.9*    Recent Labs Lab 10/28/12 1841  LIPASE 10*   CBC:  Recent Labs Lab 10/28/12 0500 10/28/12 0934 10/29/12 0540  WBC 9.1  --  11.0*  NEUTROABS 8.1*  --   --   HGB 12.2 12.6 11.6*  HCT 34.3* 37.0 34.2*  MCV 92.0  --  93.7  PLT 252  --  241   Cardiac Enzymes:  Recent Labs Lab 10/28/12 1851 10/29/12 0700  CKTOTAL  --  30  CKMB  --  1.1  TROPONINI <0.30  --    CBG:  Recent Labs Lab 10/28/12 1617 10/28/12 2009 10/28/12 2202 10/29/12 0003 10/29/12 0324 10/29/12 0803  GLUCAP 303* 277* 244* 258* 184* 181*   Hemoglobin A1C:  Recent Labs Lab 10/28/12 1841  HGBA1C 10.7*   Fasting Lipid Panel:  Recent Labs Lab 10/29/12 0540  CHOL 109  HDL 11*  LDLCALC 60  TRIG 657*  CHOLHDL 9.9   Thyroid Function Tests:  Recent Labs Lab 10/28/12 1841  TSH 0.676  FREET4 1.29  Urinalysis:  Recent Labs Lab 10/28/12 0518  COLORURINE YELLOW  LABSPEC 1.038*  PHURINE 5.0  GLUCOSEU >1000*  HGBUR TRACE*  BILIRUBINUR NEGATIVE  KETONESUR 40*  PROTEINUR 30*  UROBILINOGEN 0.2  NITRITE NEGATIVE  LEUKOCYTESUR NEGATIVE    Micro Results: Recent Results (from the past 240 hour(s))  CULTURE, BLOOD (ROUTINE X 2)     Status: None   Collection Time    10/28/12  4:50 PM      Result Value Range Status   Specimen Description BLOOD RIGHT HAND   Final   Special Requests BOTTLES DRAWN AEROBIC AND ANAEROBIC 10CC   Final   Culture  Setup Time 10/28/2012 21:15   Final   Culture     Final   Value:        BLOOD CULTURE RECEIVED NO GROWTH TO DATE CULTURE WILL BE HELD FOR 5 DAYS BEFORE ISSUING A FINAL NEGATIVE REPORT   Report Status PENDING   Incomplete  CULTURE, BLOOD (ROUTINE X 2)     Status: None   Collection Time    10/28/12  5:00 PM      Result Value Range Status   Specimen Description BLOOD LEFT HAND   Final   Special Requests BOTTLES DRAWN AEROBIC ONLY 10CC   Final   Culture  Setup Time 10/28/2012 21:15   Final   Culture      Final   Value:        BLOOD CULTURE RECEIVED NO GROWTH TO DATE CULTURE WILL BE HELD FOR 5 DAYS BEFORE ISSUING A FINAL NEGATIVE REPORT   Report Status PENDING   Incomplete  MRSA PCR SCREENING     Status: None   Collection Time    10/28/12  6:46 PM      Result Value Range Status   MRSA by PCR NEGATIVE  NEGATIVE Final   Comment:            The GeneXpert MRSA Assay (FDA     approved for NASAL specimens     only), is one component of a     comprehensive MRSA colonization     surveillance program. It is not     intended to diagnose MRSA     infection nor to guide or     monitor treatment for     MRSA infections.   Studies/Results: Ct Head Wo Contrast  10/28/2012  *RADIOLOGY REPORT*  Clinical Data:   Claustrophobia.  Lethargy.  Dilated pupils. History of cerebral palsy.  CT HEAD WITHOUT CONTRAST  Technique:  Contiguous axial images were obtained from the base of the skull through the vertex without contrast.  Comparison: 12/12/2008 CT head.  Findings: Encephalomalacia is present adjacent to the atrium of the left lateral ventricle.  There is no mass lesion, mass effect, midline shift, hydrocephalus, or hemorrhage.  Calvarium intact. Mild hyperostosis frontalis interna.  Mild ethmoid mucosal thickening.  Sphenoid sinuses clear.  Compared to prior there is no interval change.  IMPRESSION: No acute abnormality or interval change compared to prior.   Original Report Authenticated By: Andreas Newport, M.D.    Medications: I have reviewed the patient's current medications. Scheduled Meds: . doxazosin  2 mg Oral Daily  . FLUoxetine  20 mg Oral Daily  . heparin  5,000 Units Subcutaneous Q8H  . insulin aspart  0-15 Units Subcutaneous Q4H  . insulin glargine  40 Units Subcutaneous QHS  . pantoprazole (PROTONIX) IV  40 mg Intravenous Q24H  . ramipril  10 mg Oral Daily  . simvastatin  40 mg Oral QPM  . sodium chloride  3 mL Intravenous  Q12H   Continuous Infusions: . sodium chloride 125 mL/hr at  10/29/12 0912   PRN Meds:.acetaminophen, acetaminophen, ondansetron, polyvinyl alcohol Assessment/Plan: This is a 62 year old woman, with past medical history of Cerebral palsy since childhood, type 2 diabetes, hypertension, hyperlipidemia, depression, who presents with intractable vomiting for 5 days.  Recurrent Nauseas and Vomiting: Etiology is not clear but likely viral or bacterial gastroenteritis. She tolerated her liquid diet today. She feels better. LFTs, lipase and lactic acid level are normal. She continue to deny abdominal pain or diarrhea. If vomiting continues, she might need GI eval.  Plan - Stable for transfer to regular floor - IV fluid Ns 125 cc/hr -oral intake as tolerable - Acute abdominal x ray series   Hypoxia and respiratory depression: She still needs oxygen 2L/min to maintain sats above 93%. She is not in respiratory distress. Likely causes include Phenergan reaction and addition weakness of her respiratory muscles and chest deformity resulting from CP. Obesity could also have a role to play here. She uses CPAP at home.  Plan  - chest x ray to rule out PNA - Continue with oxygen supplementation to maintain O2 above 93 - Avoid respiratory depressing medications including phenergan  - I will discuss with her regarding DNI in her code status since she is prone to have a potentially reversible respiratory failure. -Continue with CPAP  Hypokalemia: Initial labs in the ED revealed potassium level of 2.5. She was repleted with IV KCl and potassium level improved to 3.6 >3.2. Likely cause of her hypokalemia is GI losses from vomiting.  Plan  -KCL oral as tolerated -will continue to monitor  Increased anion gap - resolved: Initial labs revealed elevated anion gap of 15. Possible causes of this and the need to her is likely from the hyperglycemia and with fasting ketoacidosis. Urinalysis revealed increased ketones. Gap has closed to 9 after rehydration   Type 2 diabetes:  Patient follows up with urgent care and reported. A1C 10.7%.  Her home regimen includes metformin 850 mg 3 times a day in addition to Lantus 40 units at bedtime.  CBGs still above 150.  Plan.  -Start her on Lantus and sliding scale.  -Will increase if she continues to run high later today. -will hold her Metformin  -CBGs q3 hours.   Hyperlipidemia: Home regimen includes simvastatin 40 mg daily. No known lipid profile. Will continue with simvastatin.   Hypertension: Patient's is reportedly hypotensive, and she takes ramipril, with Doxazosin. Blood pressure is currently stable. We will hold her antihypertensive medications in the setting of intractable vomiting.   Obstructive sleep apnea: Patient uses a CPAP machine at home. Will continue with CPAP with caution if she is continues to vomit.   Depression: Patient isn't suicidal at the moment. Will continue with home Prozac 20 mg daily.   Cerebral palsy: She has right hemiparesis and uses a walker at home. She live alone with daughter living 15 minutes away. She will need evaluation by PT/OT today.    Dispo: Disposition is deferred at this time, awaiting improvement of current medical problems. Anticipated discharge in approximately 1-2 day(s).   The patient does have a current PCP (Urgent care at [facility]), therefore is not requiring OPC follow-up after discharge.  The patient does not have transportation limitations that hinder transportation to clinic appointments.     LOS: 1 day   Dow Adolph PGY1 - Internal Medicine Teaching Service Pager: [phone] 10/29/2012, 11:09 AM

## 2012-10-29 NOTE — Progress Notes (Signed)
Internal Medicine Attending  Date: 10/29/2012  Patient name: Destiny Cole Medical record number: [id number] Date of birth: 02-07-1951 Age: 62 y.o. Gender: female  I spoke at length with patient regarding her code status, which was initially made partial code with no intubation.  Patient today clearly indicated that, in the event of respiratory failure, she would want intubation and mechanical ventilation as treatment of a potential reversible condition, but she would not want prolonged chronic ventilatory support in the absence of hope of successful weaning and extubation.  We will therefore change her code status to full code in accord with her wishes.  I discussed this with intern Dr. Zada Girt, and he will revise the code status order to FULL CODE.

## 2012-10-29 NOTE — H&P (Signed)
Internal Medicine Attending Admission Note Date: 10/29/2012  Patient name: Destiny Cole Medical record number: [id number] Date of birth: [birth year] Age: 62 y.o. Gender: female  I saw and evaluated the patient. I reviewed the resident's note and I agree with the resident's findings and plan as documented in the resident's note, with the following additional comments.  Chief Complaint(s): Nausea and vomiting  History - key components related to admission: Patient is a 62 year old woman with history of diabetes mellitus, hypertension, cerebral palsy with chronic right hemiparesis, and other problems as outlined in medical history admitted with a five-day history of recurrent nausea and vomiting.  Patient reports that she has not been able to keep down food or liquid; the emesis is non-bloody and non-bilious.  She denies any abdominal pain, diarrhea, bright red blood per rectum, or melena; she also denies fever, chills, sweats, chest pain, shortness of breath, cough, headache, dysuria, urinary frequency, back pain, or lower extremity edema.  She lives alone; she denies any known recent sick contacts.   Physical Exam - key components related to admission:  Filed Vitals:   10/29/12 0006 10/29/12 0027 10/29/12 0326 10/29/12 0800  BP:  125/58 124/55 117/57  Pulse: 96 96 92 90  Temp:  98.8 F (37.1 C) 100.8 F (38.2 C) 98 F (36.7 C)  TempSrc:  Oral Oral Oral  Resp: 16 17 24 24   Height:      Weight:   246 lb 4.1 oz (111.7 kg)   SpO2: 97% 94% 97% 97%   Temp (24hrs), Avg:100.4 F (38 C), Min:98 F (36.7 C), Max:104.6 F (40.3 C)   General: Alert, oriented, no distress Lungs: Clear Back: No CVA tenderness Heart: Regular; no extra sounds or murmurs Abdomen: Bowel sounds present, soft, nontender Extremities: No edema   Lab results:   Basic Metabolic Panel:  Recent Labs  40/98/11 1643 10/28/12 1841 10/29/12 0540  NA 137  --  141  K 3.6  --  3.2*  CL 101  --  108  CO2 22  --   24  GLUCOSE 315*  --  190*  BUN 14  --  15  CREATININE 0.44*  --  0.43*  CALCIUM 8.4  --  8.2*  MG  --  1.8  --   PHOS  --  2.6  --    Liver Function Tests:  Recent Labs  10/28/12 1643  AST 16  ALT 14  ALKPHOS 114  BILITOT 0.7  PROT 6.8  ALBUMIN 2.9*    Recent Labs  10/28/12 1841  LIPASE 10*    CBC:  Recent Labs  10/28/12 0500 10/28/12 0934 10/29/12 0540  WBC 9.1  --  11.0*  NEUTROABS 8.1*  --   --   HGB 12.2 12.6 11.6*  HCT 34.3* 37.0 34.2*  MCV 92.0  --  93.7  PLT 252  --  241   Cardiac Enzymes:  Recent Labs  10/28/12 1851 10/29/12 0700  CKTOTAL  --  30  CKMB  --  1.1  TROPONINI <0.30  --     CBG:  Recent Labs  10/28/12 1617 10/28/12 2009 10/28/12 2202 10/29/12 0003 10/29/12 0324 10/29/12 0803  GLUCAP 303* 277* 244* 258* 184* 181*   Hemoglobin A1C:  Recent Labs  10/28/12 1841  HGBA1C 10.7*   Fasting Lipid Panel:  Recent Labs  10/29/12 0540  CHOL 109  HDL 11*  LDLCALC 60  TRIG 914*  CHOLHDL 9.9   Thyroid Function Tests:  Recent Labs  10/28/12  1841  TSH 0.676  FREET4 1.29   Urinalysis    Component Value Date/Time   COLORURINE YELLOW 10/28/2012 0518   APPEARANCEUR CLEAR 10/28/2012 0518   LABSPEC 1.038* 10/28/2012 0518   PHURINE 5.0 10/28/2012 0518   GLUCOSEU >1000* 10/28/2012 0518   HGBUR TRACE* 10/28/2012 0518   BILIRUBINUR NEGATIVE 10/28/2012 0518   KETONESUR 40* 10/28/2012 0518   PROTEINUR 30* 10/28/2012 0518   UROBILINOGEN 0.2 10/28/2012 0518   NITRITE NEGATIVE 10/28/2012 0518   LEUKOCYTESUR NEGATIVE 10/28/2012 0518    Urine microscopic: Few squamous epithelial, WBCs 3-6, RBCs 0-2, few bacteria   Imaging results:  Ct Head Wo Contrast  10/28/2012  *RADIOLOGY REPORT*  Clinical Data:   Claustrophobia.  Lethargy.  Dilated pupils. History of cerebral palsy.  CT HEAD WITHOUT CONTRAST  Technique:  Contiguous axial images were obtained from the base of the skull through the vertex without contrast.  Comparison: 12/12/2008  CT head.  Findings: Encephalomalacia is present adjacent to the atrium of the left lateral ventricle.  There is no mass lesion, mass effect, midline shift, hydrocephalus, or hemorrhage.  Calvarium intact. Mild hyperostosis frontalis interna.  Mild ethmoid mucosal thickening.  Sphenoid sinuses clear.  Compared to prior there is no interval change.  IMPRESSION: No acute abnormality or interval change compared to prior.   Original Report Authenticated By: Andreas Newport, M.D.     Other results: EKG: Normal sinus rhythm; prominent Q wave in lead 3; normal EKG; no significant change.   Assessment & Plan by Problem:  1.  Recurrent nausea and vomiting.  The etiology of this is not clear; patient does not have associated symptoms that would point to an underlying cause.  She was afebrile on arrival yesterday morning, then spiked a fever to 102.9 orally and then 104.6 rectally yesterday afternoon; it is not clear whether her fever relates to the underlying cause of her nausea and vomiting, or was precipitated by Phenergan given yesterday afternoon.  The differential for her nausea and vomiting includes acute gastroenteritis (bacterial, viral, or otherwise), gastric outlet obstruction, or other problem.  Today patient reports that her symptoms have significantly improved; she has no nausea and has not vomited today, and ate a clear liquid breakfast without problems.  The plan is IV normal saline volume replacement; get plain x-rays of the chest and abdomen; supportive care; advance diet as tolerated; if nausea/vomiting recur, GI consult to consider EGD.  2.  Possible reaction to Phenergan.  Patient became febrile yesterday afternoon after Phenergan was administered, and also reportedly had transient hypoxia.  Would avoid further Phenergan administration; monitor and follow temperature; x-rays as above.  3.  Volume depletion secondary to nausea and vomiting.  Plan is volume replacement with IV normal saline;  follow ins and outs, volume status, and electrolytes.  4.  Uncontrolled diabetes mellitus.  Plan is follow blood sugars and adjust insulin regimen as indicated.  5.  Cerebral palsy.  Patient reports chronic right hemiparesis.  Would have OT/PT evaluate while here.  6.  Other problems as per the resident physician's note.

## 2012-10-29 NOTE — Progress Notes (Signed)
Pt has refused use of CPAP at this time.

## 2012-10-29 NOTE — Progress Notes (Signed)
Report called to Okey Regal, receiving RN on 6N.  Pt transferred to 6N07 via bed with all belongings after abdominal xray completed. Pt's daughters at bedside.   Roselie Awkward, RN

## 2012-10-29 NOTE — Care Management Note (Signed)
    Page 1 of 1   10/29/2012     11:22:53 AM   CARE MANAGEMENT NOTE 10/29/2012  Patient:  Destiny Cole, Destiny Cole   Account Number:  [id number]  Date Initiated:  10/29/2012  Documentation initiated by:  Donn Pierini  Subjective/Objective Assessment:   Pt admitted with N/V- hypokalemia     Action/Plan:   PTA pt lived at home- has HH-aide and RN at home   Anticipated DC Date:  10/31/2012   Anticipated DC Plan:  HOME/SELF CARE      DC Planning Services  CM consult      Choice offered to / List presented to:             Status of service:  In process, will continue to follow Medicare Important Message given?   (If response is "NO", the following Medicare IM given date fields will be blank) Date Medicare IM given:   Date Additional Medicare IM given:    Discharge Disposition:    Per UR Regulation:  Reviewed for med. necessity/level of care/duration of stay  If discussed at Long Length of Stay Meetings, dates discussed:    Comments:  10/29/12- 1115- Donn Pierini RN, BSN [phone] In to speak with pt- per conversation pt lives at [facility] and has a [facility] aide that comes 7 days a week for 2 hrs/83min a day. She also has a Higher education careers adviser available where she lives twice a week that will check blood pressures, weight, BS- etc. She states that she has walkers and a motorized chair at home. She will have transportation home and has her prescriptions delivered through [facility] on [address]. No d/c needs identified at this time- NCM to cont. to follow.

## 2012-10-29 NOTE — Progress Notes (Signed)
PT Cancellation and Discharge Note  Patient Details Name: TIRZAH FROSS MRN: [id number] DOB: [birth year]   Cancelled Treatment:    Reason Eval Not Completed: Pt reports she has dealt with her CP her whole life and doesn't need PT. She reports she has ambulated to bathroom with nursing and is at her baseline. Nursing confirmed she has ambulated with them. Pt politely refused PT evaluation. Will sign-off.   Makenna Macaluso 10/29/2012, 2:39 PM Pager [phone]

## 2012-10-30 DIAGNOSIS — J9601 Acute respiratory failure with hypoxia: Secondary | ICD-10-CM | POA: Diagnosis not present

## 2012-10-30 LAB — URINE CULTURE: Colony Count: 75000

## 2012-10-30 LAB — BASIC METABOLIC PANEL
CO2: 22 mEq/L (ref 19–32)
Chloride: 103 mEq/L (ref 96–112)
Creatinine, Ser: 0.44 mg/dL — ABNORMAL LOW (ref 0.50–1.10)
GFR calc Af Amer: >90 mL/min (ref >90)
Potassium: 3.9 mEq/L (ref 3.5–5.1)

## 2012-10-30 LAB — GLUCOSE, CAPILLARY
Glucose-Capillary: 164 mg/dL — ABNORMAL HIGH (ref 70–99)
Glucose-Capillary: 141 mg/dL — ABNORMAL HIGH (ref 70–99)

## 2012-10-30 LAB — CBC
HCT: 34.8 % — ABNORMAL LOW (ref 36.0–46.0)
Hemoglobin: 11.8 g/dL — ABNORMAL LOW (ref 12.0–15.0)
MCV: 93.3 fL (ref 78.0–100.0)
WBC: 9.9 10*3/uL (ref 4.0–10.5)

## 2012-10-30 MED ORDER — PANTOPRAZOLE SODIUM 40 MG PO TBEC: 40.0000 mg | DELAYED_RELEASE_TABLET | Freq: Every day | ORAL | Status: DC

## 2012-10-30 MED ORDER — METFORMIN HCL 850 MG PO TABS: 850.0000 mg | ORAL_TABLET | Freq: Three times a day (TID) | ORAL | Status: DC

## 2012-10-30 MED ORDER — POTASSIUM CHLORIDE ER 10 MEQ PO TBCR: 10.0000 meq | EXTENDED_RELEASE_TABLET | Freq: Every day | ORAL | Status: AC

## 2012-10-30 MED ORDER — ONDANSETRON HCL 4 MG PO TABS: 4.0000 mg | ORAL_TABLET | Freq: Three times a day (TID) | ORAL | Status: DC | PRN

## 2012-10-30 NOTE — Progress Notes (Signed)
Subjective:  She is feeling better. No episodes of vomiting. She has no shortness of breath. She is ready to go home. Objective: Vital signs in last 24 hours: Filed Vitals:   10/29/12 1309 10/29/12 1834 10/29/12 2132 10/30/12 0440  BP: 120/61 130/52 108/52 123/53  Pulse: 97 102 78 71  Temp: 98.2 F (36.8 C) 98.1 F (36.7 C) 98.1 F (36.7 C) 98.6 F (37 C)  TempSrc: Oral Oral Oral Oral  Resp: 22 20 16 16   Height:      Weight:      SpO2: 94% 91% 99% 90%   Weight change:   Intake/Output Summary (Last 24 hours) at 10/30/12 1150 Last data filed at 10/29/12 2300  Gross per 24 hour  Intake    615 ml  Output    400 ml  Net    215 ml   Physical Exam:  General appearance: alert, cooperative, fatigued, no distress, morbidly obese. Appears to be comfortable.  Lungs: clear to auscultation bilaterally and normal percussion bilaterally  Heart: regular rate and rhythm, S1, S2 normal, no murmur, click, rub or gallop  Abdomen: soft, non-tender; bowel sounds normal; no masses, no organomegaly  Extremities: extremities normal, atraumatic, no cyanosis or edema  Pulses: 2+ and symmetric  Skin: Skin color, texture, turgor normal. No rashes or lesions  Neurologic: Alert and oriented X 3, reduced strength 4/5 on the right UE and right LE due to Cerebral palsy. Sensation is intact  Lab Results: Basic Metabolic Panel:  Recent Labs Lab 10/28/12 1643 10/28/12 1841 10/29/12 0540 10/30/12 0550  NA 137  --  141 137  K 3.6  --  3.2* 3.9  CL 101  --  108 103  CO2 22  --  24 22  GLUCOSE 315*  --  190* 168*  BUN 14  --  15 15  CREATININE 0.44*  --  0.43* 0.44*  CALCIUM 8.4  --  8.2* 8.8  MG  --  1.8  --   --   PHOS  --  2.6  --   --    Liver Function Tests:  Recent Labs Lab 10/28/12 1643  AST 16  ALT 14  ALKPHOS 114  BILITOT 0.7  PROT 6.8  ALBUMIN 2.9*    Recent Labs Lab 10/28/12 1841  LIPASE 10*   CBC:  Recent Labs Lab 10/28/12 0500  10/29/12 0540 10/30/12 0550  WBC  9.1  --  11.0* 9.9  NEUTROABS 8.1*  --   --   --   HGB 12.2  < > 11.6* 11.8*  HCT 34.3*  < > 34.2* 34.8*  MCV 92.0  --  93.7 93.3  PLT 252  --  241 237  < > = values in this interval not displayed. Cardiac Enzymes:  Recent Labs Lab 10/28/12 1851 10/29/12 0700  CKTOTAL  --  30  CKMB  --  1.1  TROPONINI <0.30  --    CBG:  Recent Labs Lab 10/29/12 1128 10/29/12 1605 10/29/12 1951 10/30/12 0046 10/30/12 0438 10/30/12 0813  GLUCAP 225* 224* 223* 179* 164* 141*   Hemoglobin A1C:  Recent Labs Lab 10/28/12 1841  HGBA1C 10.7*   Fasting Lipid Panel:  Recent Labs Lab 10/29/12 0540  CHOL 109  HDL 11*  LDLCALC 60  TRIG 161*  CHOLHDL 9.9   Thyroid Function Tests:  Recent Labs Lab 10/28/12 1841  TSH 0.676  FREET4 1.29  Urinalysis:  Recent Labs Lab 10/28/12 0518  COLORURINE YELLOW  LABSPEC 1.038*  PHURINE  5.0  GLUCOSEU >1000*  HGBUR TRACE*  BILIRUBINUR NEGATIVE  KETONESUR 40*  PROTEINUR 30*  UROBILINOGEN 0.2  NITRITE NEGATIVE  LEUKOCYTESUR NEGATIVE    Micro Results: Recent Results (from the past 240 hour(s))  URINE CULTURE     Status: None   Collection Time    10/28/12  5:18 AM      Result Value Range Status   Specimen Description URINE, CLEAN CATCH   Final   Special Requests ADDED [phone]   Final   Culture  Setup Time 10/28/2012 15:13   Final   Colony Count 75,000 COLONIES/ML   Final   Culture     Final   Value: Multiple bacterial morphotypes present, none predominant. Suggest appropriate recollection if clinically indicated.   Report Status 10/30/2012 FINAL   Final  CULTURE, BLOOD (ROUTINE X 2)     Status: None   Collection Time    10/28/12  4:50 PM      Result Value Range Status   Specimen Description BLOOD RIGHT HAND   Final   Special Requests BOTTLES DRAWN AEROBIC AND ANAEROBIC 10CC   Final   Culture  Setup Time 10/28/2012 21:15   Final   Culture     Final   Value:        BLOOD CULTURE RECEIVED NO GROWTH TO DATE CULTURE WILL BE HELD FOR 5  DAYS BEFORE ISSUING A FINAL NEGATIVE REPORT   Report Status PENDING   Incomplete  CULTURE, BLOOD (ROUTINE X 2)     Status: None   Collection Time    10/28/12  5:00 PM      Result Value Range Status   Specimen Description BLOOD LEFT HAND   Final   Special Requests BOTTLES DRAWN AEROBIC ONLY 10CC   Final   Culture  Setup Time 10/28/2012 21:15   Final   Culture     Final   Value:        BLOOD CULTURE RECEIVED NO GROWTH TO DATE CULTURE WILL BE HELD FOR 5 DAYS BEFORE ISSUING A FINAL NEGATIVE REPORT   Report Status PENDING   Incomplete  MRSA PCR SCREENING     Status: None   Collection Time    10/28/12  6:46 PM      Result Value Range Status   MRSA by PCR NEGATIVE  NEGATIVE Final   Comment:            The GeneXpert MRSA Assay (FDA     approved for NASAL specimens     only), is one component of a     comprehensive MRSA colonization     surveillance program. It is not     intended to diagnose MRSA     infection nor to guide or     monitor treatment for     MRSA infections.   Studies/Results: Dg Chest 2 View  10/29/2012  *RADIOLOGY REPORT*  Clinical Data: Shortness of breath.  Nausea vomiting.  Question pneumonia.  CHEST - 2 VIEW  Comparison: Chest radiograph 04/26/2010  Findings: Image quality degraded by patient body habitus.  Mild cardiomegaly.  Mediastinal and hilar contours otherwise within normal limits.  There is pulmonary vascular congestion.  There is bilateral interstitial prominence and thickening of the fissures. Small pleural effusions are visualized in the posterior costophrenic angles.  No focal consolidation or pneumothorax. Trachea is midline.  No acute bony abnormality.  IMPRESSION: Radiographic findings suggestive of mild congestive heart failure. Suggest clinical correlation.  A viral or atypical pulmonary infection is  felt to be less likely.   Original Report Authenticated By: Britta Mccreedy, M.D.    Ct Head Wo Contrast  10/28/2012  *RADIOLOGY REPORT*  Clinical Data:    Claustrophobia.  Lethargy.  Dilated pupils. History of cerebral palsy.  CT HEAD WITHOUT CONTRAST  Technique:  Contiguous axial images were obtained from the base of the skull through the vertex without contrast.  Comparison: 12/12/2008 CT head.  Findings: Encephalomalacia is present adjacent to the atrium of the left lateral ventricle.  There is no mass lesion, mass effect, midline shift, hydrocephalus, or hemorrhage.  Calvarium intact. Mild hyperostosis frontalis interna.  Mild ethmoid mucosal thickening.  Sphenoid sinuses clear.  Compared to prior there is no interval change.  IMPRESSION: No acute abnormality or interval change compared to prior.   Original Report Authenticated By: Andreas Newport, M.D.    Medications: I have reviewed the patient's current medications. Scheduled Meds: . doxazosin  2 mg Oral Daily  . FLUoxetine  20 mg Oral Daily  . heparin  5,000 Units Subcutaneous Q8H  . insulin aspart  0-15 Units Subcutaneous Q4H  . insulin glargine  40 Units Subcutaneous QHS  . pantoprazole (PROTONIX) IV  40 mg Intravenous Q24H  . ramipril  10 mg Oral Daily  . simvastatin  40 mg Oral QPM  . sodium chloride  3 mL Intravenous Q12H   Continuous Infusions:   PRN Meds:.acetaminophen, acetaminophen, ondansetron, polyvinyl alcohol Assessment/Plan: This is a 62 year old woman, with past medical history of Cerebral palsy since childhood, type 2 diabetes, hypertension, hyperlipidemia, depression, who presents with intractable vomiting for 5 days.  Recurrent Nauseas and Vomiting: Etiology is not clear but likely viral or bacterial gastroenteritis. She tolerated her oral intake. She did not vomit since admission. No abdominal pain was reported. She did not have diarrhea. CBC revealed no leukocytosis. While in the ED, she spiked a fever of 104. After that she remained afebrile. LFTs, lipase and lactic acid level were normal. She was treated with when necessary Zofran. If vomiting continues, she might need  GI eval.  Plan - Will discharge her after the echocardiogram results.  Hypoxia and respiratory depression: While in the ED, her oxygen saturations reduced to the upper 80s. For about 30 hours, she required 2L/min by nasal cannula to maintain sats above 93%. Likely causes include Phenergan reaction and addition weakness of her respiratory muscles and chest deformity resulting from CP. Obesity could also have a role to play here. She uses CPAP at home, and she continued using this during the hospital stay. Her chest x-ray revealed mild pulmonary congestion unlikely to be pneumonia. EKG revealed deep Q waves in lead 3. No other significant abnormalities. At this point, an echocardiogram was ordered to assess her left ventricular function as this might contribute to her shortness of breath and hypoxia. Plan  - Patient will be discharged if the echocardiogram is normal. -Will recommend repeat of chest x-ray as outpatient to assess the pulmonary congestion -Continue with CPAP while inpatient  Hypokalemia - resolved: Initial labs in the ED revealed potassium level of 2.5. She was repleted with IV KCl and potassium level improved to 3.6 >3.2>3.9. Likely cause of her hypokalemia is GI losses from vomiting. She'll require a repeat basic metabolic panel at her PCPs office for this hypokalemia.   Increased anion gap - resolved: Initial labs revealed elevated anion gap of 15. Possible causes of this and the need to her is likely from the hyperglycemia and with fasting ketoacidosis. Urinalysis revealed increased ketones.  Gap has closed to 9 after rehydration   Type 2 diabetes: Patient follows up with urgent care and reported. A1C 10.7%.  Her home regimen includes metformin 850 mg 3 times a day in addition to Lantus 40 units at bedtime.  CBGs are around 140 -150.  Plan.  - Lantus and sliding scale.  -will hold her Metformin  -CBGs q3 hours.   Hyperlipidemia: Home regimen includes simvastatin 40 mg daily. No  known lipid profile. Will continue with simvastatin.   Hypertension: Patient's is reportedly hypotensive, and she takes ramipril, with Doxazosin. Blood pressure is currently stable. We will hold her antihypertensive medications in the setting of intractable vomiting.   Obstructive sleep apnea: Patient uses a CPAP machine at home. Will continue with CPAP with caution if she is continues to vomit.   Depression: Patient isn't suicidal at the moment. Will continue with home Prozac 20 mg daily.   Cerebral palsy: She has right hemiparesis and uses a walker at home. She live alone with daughter living 15 minutes away. She will need evaluation by PT/OT today.    Dispo: Disposition is deferred at this time, awaiting improvement of current medical problems. Anticipated discharge in approximately 1-2 day(s).   The patient does have a current PCP ([facility]gent care at [facility]), therefore is not requiring [facility] follow-up after discharge.  The patient does not have transportation limitations that hinder transportation to clinic appointments.     LOS: 2 days   Dow Adolph PGY1 - Internal Medicine Teaching Service Pager: [phone] 10/30/2012, 11:50 AM

## 2012-10-30 NOTE — Progress Notes (Signed)
As Noted last evening:   PT Cancellation and Discharge Note  Patient Details  Name: Destiny Cole  MRN: [id number]  DOB: 06/12/1951  Cancelled Treatment: Reason Eval Not Completed: Pt reports she has dealt with her CP her whole life and doesn't need PT. She reports she has ambulated to bathroom with nursing and is at her baseline. Nursing confirmed she has ambulated with them. Pt politely refused PT evaluation. Will sign-off.  Charlotte Crumb, PT DPT  [phone]

## 2012-10-30 NOTE — Discharge Summary (Signed)
Internal Medicine Teaching [facility] Discharge Note  Name: Destiny Cole MRN: [id number] DOB: 07/17/1951 62 y.o.  Date of Admission: 10/28/2012  4:10 AM Date of Discharge: 10/30/2012 Attending Physician: Farley Ly, MD  Discharge Diagnosis: Principal Problem:   Nausea, vomiting and fever - likely secondary to viral gastroenteritis Active Problems:   Diabetes mellitus without complication   Cerebral palsy   Hypertension   Acute respiratory depression with hypoxia - Resolved   Discharge Medications:   Medication List    TAKE these medications       doxazosin 2 MG tablet  Commonly known as:  CARDURA  Take 2 mg by mouth daily.     FLUoxetine 20 MG tablet  Commonly known as:  PROZAC  Take 20 mg by mouth daily.     hydroxypropyl methylcellulose 2.5 % ophthalmic solution  Commonly known as:  ISOPTO TEARS  Place 2 drops into both eyes 3 (three) times daily as needed (for dry eyes).     insulin glargine 100 UNIT/ML injection  Commonly known as:  LANTUS  Inject 40 Units into the skin at bedtime.     metFORMIN 850 MG tablet  Commonly known as:  GLUCOPHAGE  Take 1 tablet (850 mg total) by mouth 3 (three) times daily.     ondansetron 4 MG tablet  Commonly known as:  ZOFRAN  Take 1 tablet (4 mg total) by mouth every 8 (eight) hours as needed for nausea.     pantoprazole 40 MG tablet  Commonly known as:  PROTONIX  Take 1 tablet (40 mg total) by mouth daily.     potassium chloride 10 MEQ tablet  Commonly known as:  K-DUR  Take 1 tablet (10 mEq total) by mouth daily.     ramipril 10 MG capsule  Commonly known as:  ALTACE  Take 10 mg by mouth daily.     simvastatin 40 MG tablet  Commonly known as:  ZOCOR  Take 40 mg by mouth every evening.        Disposition and follow-up:   Ms.Destiny Cole was discharged from [facility] in Good condition.  At the hospital follow up visit please address   - Please repeat chest x ray in 2-3 weeks  for reevaluation of pulmonary congestion  - Please consider GI referral for EGD if her vomiting persists  - Please check a BMET for hypokalemia. - Please follow up home safety concerns are raised by her family. She was discharged with [facility] RN and social worker to assess home situation. - Of note, she has a possible respiratory reaction with this medication with RR 4 and hypoxia a few minutes after administration. Other respiratory depressants like benzos should be used with caution.   Follow-up Appointments: Follow-up Information   Follow up with Millsaps, Joelene Millin, NP. (Please see your doctor within a weeek)    Contact information:   [facility] [address] [place] [address] [phone]      Discharge Orders   Future Orders Complete By Expires     Diet - low sodium heart healthy  As directed     Increase activity slowly  As directed        Consultations:  None  Procedures Performed:  Dg Chest 2 View  10/29/2012  *RADIOLOGY REPORT*  Clinical Data: Shortness of breath.  Nausea vomiting.  Question pneumonia.  CHEST - 2 VIEW  Comparison: Chest radiograph 04/26/2010  Findings: Image quality degraded by patient  body habitus.  Mild cardiomegaly.  Mediastinal and hilar contours otherwise within normal limits.  There is pulmonary vascular congestion.  There is bilateral interstitial prominence and thickening of the fissures. Small pleural effusions are visualized in the posterior costophrenic angles.  No focal consolidation or pneumothorax. Trachea is midline.  No acute bony abnormality.  IMPRESSION: Radiographic findings suggestive of mild congestive heart failure. Suggest clinical correlation.  A viral or atypical pulmonary infection is felt to be less likely.   Original Report Authenticated By: Britta Mccreedy, M.D.    Ct Head Wo Contrast  10/28/2012  *RADIOLOGY REPORT*  Clinical Data:   Claustrophobia.  Lethargy.  Dilated pupils. History of cerebral palsy.  CT HEAD  WITHOUT CONTRAST  Technique:  Contiguous axial images were obtained from the base of the skull through the vertex without contrast.  Comparison: 12/12/2008 CT head.  Findings: Encephalomalacia is present adjacent to the atrium of the left lateral ventricle.  There is no mass lesion, mass effect, midline shift, hydrocephalus, or hemorrhage.  Calvarium intact. Mild hyperostosis frontalis interna.  Mild ethmoid mucosal thickening.  Sphenoid sinuses clear.  Compared to prior there is no interval change.  IMPRESSION: No acute abnormality or interval change compared to prior.   Original Report Authenticated By: Andreas Newport, M.D.     2D Echo: results pending    Admission HPI:  Chief Complaint: Vomiting for 5 days  History of Present Illness: This is a 62 year old woman, with past medical history of hypertension, type 2 diabetes hyperlipidemia, cerebral palsy, and obesity, who presents to the emergency department, with intractable vomiting. Symptoms started 5 days ago. No hematemesis. It is mainly clear fluid. She's been unable to keep anything down. She has also been unable to comply with her medications due to vomiting. She denies symptoms of dizziness, she does not have abdominal pain, or diarrhea. No abdominal distention, and she is having good bowel movements. She denies history of fevers or chills. The intake has been poor since the onset of symptoms. No sick contacts. No myalgias or arthralgia. She thinks that she developed lightheadedness after receiving Zofran. No new medications.   Review of Systems:  Constitutional: positive for malaise and she used to weigh 350 pounds about 2 years ago but now she weighs about 220 lb. She uses rasiberry ketones for weight loss.  Eyes: negative  Ears, nose, mouth, throat, and face: negative for ear drainage, earaches, epistaxis, nasal congestion, snoring, sore throat and voice change  Respiratory: negative for asthma, cough, pleurisy/chest pain, pneumonia,  stridor and wheezing  Cardiovascular: negative for chest pain, chest pressure/discomfort, irregular heart beat, lower extremity edema, near-syncope, orthopnea and palpitations  Genitourinary:negative  Integument/breast: negative  Hematologic/lymphatic: negative  Musculoskeletal:negative  Neurological: negative for coordination problems, dizziness, gait problems, headaches, paresthesia, seizures, speech problems and weakness  Behavioral/Psych: negative  Endocrine: negative  Allergic/Immunologic: negative   Physical Exam:  Blood pressure 143/71, pulse 109, temperature 98.5 F (36.9 C), temperature source Oral, resp. rate 31, height 5' (1.524 m), weight 220 lb (99.791 kg), SpO2 92.00%.  General appearance: alert, cooperative, fatigued, moderate distress, morbidly obese and vomits occasionally. Two daughters at bedside.  Head: Normocephalic, without obvious abnormality, atraumatic  Eyes: conjunctivae/corneas clear. PERRL, EOM's intact.  Ears: normal TM's and external ear canals both ears  Throat: lips, mucosa, and tongue normal; teeth and gums normal  Neck: no adenopathy, no carotid bruit, no JVD, supple, symmetrical, trachea midline and thyroid not enlarged, symmetric, no tenderness/mass/nodules  Lungs: clear to auscultation bilaterally and normal  percussion bilaterally  Heart: regular rate and rhythm, S1, S2 normal, no murmur, click, rub or gallop  Abdomen: soft, non-tender; bowel sounds normal; no masses, no organomegaly  Extremities: extremities normal, atraumatic, no cyanosis or edema  Pulses: 2+ and symmetric  Skin: Skin color, texture, turgor normal. No rashes or lesions  Neurologic: Alert and oriented X 3, reduced strength 4/5 on the right UE and right LE due to Cerebral palsy. Sensation is intact. Normal symmetric reflexes.  Hospital Course by problem list:  Nauseas, Vomiting and fever: Etiology is not clear but likely viral or bacterial gastroenteritis. No abdominal pain was  reported. She did not have diarrhea. CBC revealed no leukocytosis. While in the ED, she spiked a fever of 104. After that she remained afebrile. LFTs, lipase and lactic acid level were normal. She tolerated her oral intake. Symptoms resolved after first hospital day. She was treated with when necessary Zofran. If vomiting recurs, she might need GI eval. She was discharged with prn Zofran.   Transient Hypoxia and acute respiratory depression: While in the ED, her oxygen saturations reduced to the upper 80s. For about 30 hours, she required 2L/min by nasal cannula to maintain sats above 93%. Likely causes include Phenergan reaction and addition weakness of her respiratory muscles in the setting of limited respiratory reserve due to cerebral pulse.  She uses CPAP at home, and she continued using this during the hospital stay. Her chest x-ray revealed mild pulmonary congestion unlikely to be pneumonia. EKG revealed deep Q waves in lead 3. No other significant abnormalities. At this point, an echocardiogram was ordered to assess her left ventricular function as this might contribute to her shortness of breath and hypoxia. Results were pending at the time of discharge and these will be faxed to her PCP. She will require a repeat chest ray after 2-3 weeks for evaluation of the pulmonary edema. She will make an appointment with her PCP within a week. In the future, phenergan other medication with respiration suppression potential should not be administered to this patient.   Hypokalemia - resolved: Initial labs in the ED revealed potassium level of 2.5. She was repleted with IV KCl and potassium level improved to 3.6 >3.2>3.9. Likely cause of her hypokalemia is GI losses from vomiting. She'll require a repeat basic metabolic panel at her PCPs office for this hypokalemia.   Increased anion gap - resolved: Initial labs revealed elevated anion gap of 15. Possible causes of this and the need to her is likely from the  hyperglycemia and with fasting ketoacidosis. Urinalysis revealed increased ketones. Gap has closed to 9 after rehydration.  Type 2 diabetes: Patient follows up with urgent care and reported. A1C 10.7%. Her home regimen includes metformin 850 mg 3 times a day in addition to Lantus 40 units at bedtime. CBGs are around 140 -150. She was discharged on her home regimen and close follow up with PCP as outpatient.  Hyperlipidemia: Home regimen includes simvastatin 40 mg daily. No known lipid profile. Will continue with simvastatin.   Hypertension: Patient's is reportedly hypertensive, and she takes ramipril, with Doxazosin. Blood pressure is currently stable. We will held her antihypertensive medications in the setting of intractable vomiting and restarted them at discharge.   Obstructive sleep apnea: Patient uses a CPAP machine at home. We cautiously continued with CPAP while she was inpatient in setting of vomiting.   Depression: Patient isn't suicidal at the moment. Will continue with home Prozac 20 mg daily.   Cerebral palsy: She  has right hemiparesis and uses a walker at home. She lives alone with daughter living 15 minutes away. She politely declined PT as she insisted that she does not have any PT needs and she has adapted to her disabilities since childhood. Her daughters raised concerns about her home safety but the patient insisted that she is doing well with a home aide who come to her house daily to help her with the house chores. She also does groceries shopping for her. Her daughter help her with transportation to the doctors appointment but she is otherwise does not depend on them. There was no clinical suspicion for lack of competence/capacity and a formal psychiatry consulted was therefore not obtained. However, a HH RN was arranged to evaluate her home situation and further assistance can be directed depending on the findings. Her PCP will follow up.    Discharge Vitals:  BP 124/53  Pulse  97  Temp(Src) 98.2 F (36.8 C) (Oral)  Resp 20  Ht 5' (1.524 m)  Wt 246 lb 4.1 oz (111.7 kg)  BMI 48.09 kg/m2  SpO2 98%  Discharge Labs:  Results for orders placed during the hospital encounter of 10/28/12 (from the past 24 hour(s))  GLUCOSE, CAPILLARY     Status: Abnormal   Collection Time    10/29/12  4:05 PM      Result Value Range   Glucose-Capillary 224 (*) 70 - 99 mg/dL  GLUCOSE, CAPILLARY     Status: Abnormal   Collection Time    10/29/12  7:51 PM      Result Value Range   Glucose-Capillary 223 (*) 70 - 99 mg/dL   Comment 1 Documented in Chart     Comment 2 Notify RN    GLUCOSE, CAPILLARY     Status: Abnormal   Collection Time    10/30/12 12:46 AM      Result Value Range   Glucose-Capillary 179 (*) 70 - 99 mg/dL   Comment 1 Documented in Chart     Comment 2 Notify RN    GLUCOSE, CAPILLARY     Status: Abnormal   Collection Time    10/30/12  4:38 AM      Result Value Range   Glucose-Capillary 164 (*) 70 - 99 mg/dL   Comment 1 Documented in Chart     Comment 2 Notify RN    BASIC METABOLIC PANEL     Status: Abnormal   Collection Time    10/30/12  5:50 AM      Result Value Range   Sodium 137  135 - 145 mEq/L   Potassium 3.9  3.5 - 5.1 mEq/L   Chloride 103  96 - 112 mEq/L   CO2 22  19 - 32 mEq/L   Glucose, Bld 168 (*) 70 - 99 mg/dL   BUN 15  6 - 23 mg/dL   Creatinine, Ser 1.61 (*) 0.50 - 1.10 mg/dL   Calcium 8.8  8.4 - 09.6 mg/dL   GFR calc non Af Amer >90  >90 mL/min   GFR calc Af Amer >90  >90 mL/min  CBC     Status: Abnormal   Collection Time    10/30/12  5:50 AM      Result Value Range   WBC 9.9  4.0 - 10.5 K/uL   RBC 3.73 (*) 3.87 - 5.11 MIL/uL   Hemoglobin 11.8 (*) 12.0 - 15.0 g/dL   HCT 04.5 (*) 40.9 - 81.1 %   MCV 93.3  78.0 - 100.0  fL   MCH 31.6  26.0 - 34.0 pg   MCHC 33.9  30.0 - 36.0 g/dL   RDW 08.6  57.8 - 46.9 %   Platelets 237  150 - 400 K/uL  GLUCOSE, CAPILLARY     Status: Abnormal   Collection Time    10/30/12  8:13 AM      Result  Value Range   Glucose-Capillary 141 (*) 70 - 99 mg/dL  GLUCOSE, CAPILLARY     Status: Abnormal   Collection Time    10/30/12 11:49 AM      Result Value Range   Glucose-Capillary 142 (*) 70 - 99 mg/dL   Comment 1 Notify RN      Signed: Dow Adolph 10/30/2012, 11:35 AM Time Spent on Discharge: 45 minutes  Services Ordered on Discharge: HH RN to evaluate home safety  Equipment Ordered on Discharge: None

## 2012-10-30 NOTE — Progress Notes (Signed)
Order received, chart reviewed, noted pt had told PT that she feels she is at her baseline and per chart she is to D/C to day. WIll not eval, will sign off. .Lynden Ang

## 2012-10-30 NOTE — Progress Notes (Signed)
Patient discharged to home with daughter.  Discharge instructions completed including follow up care, medications and Home health nurse visit.  Verbalizes understanding with no further questions.  Vital signs stable.  Discharged per wheelchair with daughter.

## 2012-10-30 NOTE — Progress Notes (Signed)
Internal Medicine Attending  Date: 10/30/2012  Patient name: Destiny Cole Medical record number: [id number] Date of birth: 1950-12-22 Age: 62 y.o. Gender: female  I saw and evaluated the patient on AM rounds with house staff.  She is afebrile and doing well with no complaints; she has had no further nausea or vomiting since her hospitalization.  She denies shortness of breath, chest pain, abdominal pain, or other problems.  The cause of patient's presenting symptoms of nausea and vomiting with fever is not clear; given the complete resolution of her symptoms without specific treatment, it was likely a viral gastroenteritis.  Patient declined physical therapy assessment; I spoke with her nurse, who confirmed that she has been up and ambulating without problems.  Given her chest x-ray findings of interstitial prominence with small pleural effusions, a 2-D echocardiogram was ordered.  She will need follow-up this week with her primary care physician, and will need a follow up chest ray x-ray within 2-3 weeks.

## 2012-10-31 NOTE — Progress Notes (Signed)
HHRN and HHSW arranged by patient and daughter's choice with [facility] after being presented with a list of available choices in this area.  Address and phone number are correct as listed in EPIC.  Pt and daughter both confirmed that there were no needs for DME at this time.   Daughter asked about pt getting PT.  Since that was not related to her reason for hospitalization, I directed her to speak with pt's PCP about this. She stated understanding.

## 2012-10-31 NOTE — Progress Notes (Signed)
[facility]  Patient Status: New  [facility] is providing the following services: RN and MSW  If patient discharges after hours, please call [phone].   Wynelle Bourgeois 10/31/2012, 11:02 AM

## 2012-11-04 LAB — CULTURE, BLOOD (ROUTINE X 2)

## 2013-01-10 ENCOUNTER — Encounter: Payer: Self-pay | Admitting: *Deleted

## 2013-12-24 ENCOUNTER — Ambulatory Visit: Payer: Medicaid Other

## 2013-12-31 ENCOUNTER — Ambulatory Visit: Payer: Medicaid Other

## 2014-01-07 ENCOUNTER — Ambulatory Visit: Payer: Medicaid Other

## 2015-03-22 ENCOUNTER — Encounter (HOSPITAL_COMMUNITY): Payer: Self-pay | Admitting: Emergency Medicine

## 2015-03-22 ENCOUNTER — Emergency Department (HOSPITAL_COMMUNITY)
Admission: EM | Admit: 2015-03-22 | Discharge: 2015-03-22 | Disposition: A | Discharge status: Home / Self Care | Payer: Medicaid Other | Attending: Emergency Medicine | Admitting: Emergency Medicine

## 2015-03-22 DIAGNOSIS — E876 Hypokalemia: Secondary | ICD-10-CM | POA: Diagnosis not present

## 2015-03-22 DIAGNOSIS — Z794 Long term (current) use of insulin: Secondary | ICD-10-CM | POA: Diagnosis not present

## 2015-03-22 DIAGNOSIS — Z79899 Other long term (current) drug therapy: Secondary | ICD-10-CM | POA: Diagnosis not present

## 2015-03-22 DIAGNOSIS — R739 Hyperglycemia, unspecified: Secondary | ICD-10-CM

## 2015-03-22 DIAGNOSIS — I10 Essential (primary) hypertension: Secondary | ICD-10-CM | POA: Insufficient documentation

## 2015-03-22 DIAGNOSIS — E1165 Type 2 diabetes mellitus with hyperglycemia: Secondary | ICD-10-CM | POA: Diagnosis present

## 2015-03-22 LAB — HEPATIC FUNCTION PANEL
ALBUMIN: 1.7 g/dL — AB (ref 3.5–5.0)
ALT: 8 U/L — AB (ref 14–54)
AST: 12 U/L — AB (ref 15–41)
Alkaline Phosphatase: 89 U/L (ref 38–126)
BILIRUBIN DIRECT: 0.1 mg/dL (ref 0.1–0.5)
BILIRUBIN INDIRECT: 0.7 mg/dL (ref 0.3–0.9)
Total Bilirubin: 0.8 mg/dL (ref 0.3–1.2)
Total Protein: 6.7 g/dL (ref 6.5–8.1)

## 2015-03-22 LAB — URINE MICROSCOPIC-ADD ON

## 2015-03-22 LAB — CBG MONITORING, ED
GLUCOSE-CAPILLARY: 395 mg/dL — AB (ref 65–99)
Glucose-Capillary: 379 mg/dL — ABNORMAL HIGH (ref 65–99)

## 2015-03-22 LAB — DIFFERENTIAL
BASOS PCT: 0 % (ref 0–1)
Basophils Absolute: 0 10*3/uL (ref 0.0–0.1)
EOS ABS: 0.1 10*3/uL (ref 0.0–0.7)
EOS PCT: 0 % (ref 0–5)
LYMPHS ABS: 1.8 10*3/uL (ref 0.7–4.0)
LYMPHS PCT: 13 % (ref 12–46)
MONO ABS: 1.5 10*3/uL — AB (ref 0.1–1.0)
Monocytes Relative: 11 % (ref 3–12)
NEUTROS ABS: 10.1 10*3/uL — AB (ref 1.7–7.7)
NEUTROS PCT: 76 % (ref 43–77)

## 2015-03-22 LAB — BASIC METABOLIC PANEL
ANION GAP: 12 (ref 5–15)
BUN: 7 mg/dL (ref 6–20)
CALCIUM: 8.2 mg/dL — AB (ref 8.9–10.3)
CO2: 31 mmol/L (ref 22–32)
CREATININE: 0.6 mg/dL (ref 0.44–1.00)
Chloride: 87 mmol/L — ABNORMAL LOW (ref 101–111)
GFR calc Af Amer: >60 mL/min (ref >60)
GFR calc non Af Amer: >60 mL/min (ref >60)
GLUCOSE: 417 mg/dL — AB (ref 65–99)
POTASSIUM: 2.9 mmol/L — AB (ref 3.5–5.1)
Sodium: 130 mmol/L — ABNORMAL LOW (ref 135–145)

## 2015-03-22 LAB — URINALYSIS, ROUTINE W REFLEX MICROSCOPIC
BILIRUBIN URINE: NEGATIVE
KETONES UR: 40 mg/dL — AB
LEUKOCYTES UA: NEGATIVE
NITRITE: NEGATIVE
PROTEIN: 30 mg/dL — AB
SPECIFIC GRAVITY, URINE: 1.041 — AB (ref 1.005–1.030)
Urobilinogen, UA: 0.2 mg/dL (ref 0.0–1.0)
pH: 5.5 (ref 5.0–8.0)

## 2015-03-22 LAB — CBC
HEMATOCRIT: 35.9 % — AB (ref 36.0–46.0)
Hemoglobin: 12 g/dL (ref 12.0–15.0)
MCH: 31.5 pg (ref 26.0–34.0)
MCHC: 33.4 g/dL (ref 30.0–36.0)
MCV: 94.2 fL (ref 78.0–100.0)
PLATELETS: 524 10*3/uL — AB (ref 150–400)
RBC: 3.81 MIL/uL — AB (ref 3.87–5.11)
RDW: 12.5 % (ref 11.5–15.5)
WBC: 13.4 10*3/uL — ABNORMAL HIGH (ref 4.0–10.5)

## 2015-03-22 LAB — CK: Total CK: 17 U/L — ABNORMAL LOW (ref 38–234)

## 2015-03-22 MED ORDER — INSULIN ASPART 100 UNIT/ML ~~LOC~~ SOLN
10.0000 [IU] | Freq: Once | SUBCUTANEOUS | Status: AC
  Administered 2015-03-22: 10 [IU] via SUBCUTANEOUS
  Filled 2015-03-22: qty 1

## 2015-03-22 MED ORDER — POTASSIUM CHLORIDE 10 MEQ/100ML IV SOLN
10.0000 meq | Freq: Once | INTRAVENOUS | Status: AC
  Administered 2015-03-22: 10 meq via INTRAVENOUS
  Filled 2015-03-22: qty 100

## 2015-03-22 MED ORDER — POTASSIUM CHLORIDE CRYS ER 20 MEQ PO TBCR
40.0000 meq | EXTENDED_RELEASE_TABLET | Freq: Once | ORAL | Status: AC
  Administered 2015-03-22: 40 meq via ORAL
  Filled 2015-03-22: qty 2

## 2015-03-22 MED ORDER — POTASSIUM CHLORIDE CRYS ER 20 MEQ PO TBCR: 20.0000 meq | EXTENDED_RELEASE_TABLET | Freq: Two times a day (BID) | ORAL | Status: DC

## 2015-03-22 NOTE — Discharge Instructions (Signed)
Your tests today showed your blood potassium is very low and this may account for your general weakness. Please make sure to take your potassium supplement. He will need to have her potassium level rechecked in one-2 weeks. He also will need to have your medication for your blood sugar and adjust it so that it stays under better control.  Hypokalemia Hypokalemia means that the amount of potassium in the blood is lower than normal.Potassium is a chemical, called an electrolyte, that helps regulate the amount of fluid in the body. It also stimulates muscle contraction and helps nerves function properly.Most of the body's potassium is inside of cells, and only a very small amount is in the blood. Because the amount in the blood is so small, minor changes can be life-threatening. CAUSES  Antibiotics.  Diarrhea or vomiting.  Using laxatives too much, which can cause diarrhea.  Chronic kidney disease.  Water pills (diuretics).  Eating disorders (bulimia).  Low magnesium level.  Sweating a lot. SIGNS AND SYMPTOMS  Weakness.  Constipation.  Fatigue.  Muscle cramps.  Mental confusion.  Skipped heartbeats or irregular heartbeat (palpitations).  Tingling or numbness. DIAGNOSIS  Your health care provider can diagnose hypokalemia with blood tests. In addition to checking your potassium level, your health care provider may also check other lab tests. TREATMENT Hypokalemia can be treated with potassium supplements taken by mouth or adjustments in your current medicines. If your potassium level is very low, you may need to get potassium through a vein (IV) and be monitored in the hospital. A diet high in potassium is also helpful. Foods high in potassium are:  Nuts, such as peanuts and pistachios.  Seeds, such as sunflower seeds and pumpkin seeds.  Peas, lentils, and lima beans.  Whole grain and bran cereals and breads.  Fresh fruit and vegetables, such as apricots, avocado,  bananas, cantaloupe, kiwi, oranges, tomatoes, asparagus, and potatoes.  Orange and tomato juices.  Red meats.  Fruit yogurt. HOME CARE INSTRUCTIONS  Take all medicines as prescribed by your health care provider.  Maintain a healthy diet by including nutritious food, such as fruits, vegetables, nuts, whole grains, and lean meats.  If you are taking a laxative, be sure to follow the directions on the label. SEEK MEDICAL CARE IF:  Your weakness gets worse.  You feel your heart pounding or racing.  You are vomiting or having diarrhea.  You are diabetic and having trouble keeping your blood glucose in the normal range. SEEK IMMEDIATE MEDICAL CARE IF:  You have chest pain, shortness of breath, or dizziness.  You are vomiting or having diarrhea for more than 2 days.  You faint. MAKE SURE YOU:   Understand these instructions.  Will watch your condition.  Will get help right away if you are not doing well or get worse. Document Released: 07/25/2005 Document Revised: 05/15/2013 Document Reviewed: 01/25/2013 [facility] Patient Information 2015 [place], [place]. This information is not intended to replace advice given to you by your health care provider. Make sure you discuss any questions you have with your health care provider.  Hyperglycemia Hyperglycemia occurs when the glucose (sugar) in your blood is too high. Hyperglycemia can happen for many reasons, but it most often happens to people who do not know they have diabetes or are not managing their diabetes properly.  CAUSES  Whether you have diabetes or not, there are other causes of hyperglycemia. Hyperglycemia can occur when you have diabetes, but it can also occur in other situations that you  might not be as aware of, such as: Diabetes  If you have diabetes and are having problems controlling your blood glucose, hyperglycemia could occur because of some of the following reasons:  Not following your meal plan.  Not taking  your diabetes medications or not taking it properly.  Exercising less or doing less activity than you normally do.  Being sick. Pre-diabetes  This cannot be ignored. Before people develop Type 2 diabetes, they almost always have "pre-diabetes." This is when your blood glucose levels are higher than normal, but not yet high enough to be diagnosed as diabetes. Research has shown that some long-term damage to the body, especially the heart and circulatory system, may already be occurring during pre-diabetes. If you take action to manage your blood glucose when you have pre-diabetes, you may delay or prevent Type 2 diabetes from developing. Stress  If you have diabetes, you may be "diet" controlled or on oral medications or insulin to control your diabetes. However, you may find that your blood glucose is higher than usual in the hospital whether you have diabetes or not. This is often referred to as "stress hyperglycemia." Stress can elevate your blood glucose. This happens because of hormones put out by the body during times of stress. If stress has been the cause of your high blood glucose, it can be followed regularly by your caregiver. That way he/she can make sure your hyperglycemia does not continue to get worse or progress to diabetes. Steroids  Steroids are medications that act on the infection fighting system (immune system) to block inflammation or infection. One side effect can be a rise in blood glucose. Most people can produce enough extra insulin to allow for this rise, but for those who cannot, steroids make blood glucose levels go even higher. It is not unusual for steroid treatments to "uncover" diabetes that is developing. It is not always possible to determine if the hyperglycemia will go away after the steroids are stopped. A special blood test called an A1c is sometimes done to determine if your blood glucose was elevated before the steroids were  started. SYMPTOMS  Thirsty.  Frequent urination.  Dry mouth.  Blurred vision.  Tired or fatigue.  Weakness.  Sleepy.  Tingling in feet or leg. DIAGNOSIS  Diagnosis is made by monitoring blood glucose in one or all of the following ways:  A1c test. This is a chemical found in your blood.  Fingerstick blood glucose monitoring.  Laboratory results. TREATMENT  First, knowing the cause of the hyperglycemia is important before the hyperglycemia can be treated. Treatment may include, but is not be limited to:  Education.  Change or adjustment in medications.  Change or adjustment in meal plan.  Treatment for an illness, infection, etc.  More frequent blood glucose monitoring.  Change in exercise plan.  Decreasing or stopping steroids.  Lifestyle changes. HOME CARE INSTRUCTIONS   Test your blood glucose as directed.  Exercise regularly. Your caregiver will give you instructions about exercise. Pre-diabetes or diabetes which comes on with stress is helped by exercising.  Eat wholesome, balanced meals. Eat often and at regular, fixed times. Your caregiver or nutritionist will give you a meal plan to guide your sugar intake.  Being at an ideal weight is important. If needed, losing as little as 10 to 15 pounds may help improve blood glucose levels. SEEK MEDICAL CARE IF:   You have questions about medicine, activity, or diet.  You continue to have symptoms (problems such as  increased thirst, urination, or weight gain). SEEK IMMEDIATE MEDICAL CARE IF:   You are vomiting or have diarrhea.  Your breath smells fruity.  You are breathing faster or slower.  You are very sleepy or incoherent.  You have numbness, tingling, or pain in your feet or hands.  You have chest pain.  Your symptoms get worse even though you have been following your caregiver's orders.  If you have any other questions or concerns. Document Released: 01/18/2001 Document Revised: 10/17/2011  Document Reviewed: 11/21/2011 [facility] Patient Information 2015 [place], [place]. This information is not intended to replace advice given to you by your health care provider. Make sure you discuss any questions you have with your health care provider.  Potassium Salts tablets, extended-release tablets or capsules What is this medicine? POTASSIUM (poe TASS i um) is a natural salt that is important for the heart, muscles, and nerves. It is found in many foods and is normally supplied by a well balanced diet. This medicine is used to treat low potassium. This medicine may be used for other purposes; ask your health care provider or pharmacist if you have questions. COMMON BRAND NAME(S): ED-K+10, Glu-K, K-10, K-8, K-Dur, K-Tab, Kaon-CL, Klor-Con, Klor-Con M10, Klor-Con M15, Klor-Con M20, Klotrix, Micro-K, Micro-K Extencaps, Slow-K What should I tell my health care provider before I take this medicine? They need to know if you have any of these conditions: -dehydration -diabetes -irregular heartbeat -kidney disease -stomach ulcers or other stomach problems -an unusual or allergic reaction to potassium salts, other medicines, foods, dyes, or preservatives -pregnant or trying to get pregnant -breast-feeding How should I use this medicine? Take this medicine by mouth with a full glass of water. Follow the directions on the prescription label. Take with food. Do not suck on, crush, or chew this medicine. If you have difficulty swallowing, ask the pharmacist how to take. Take your medicine at regular intervals. Do not take it more often than directed. Do not stop taking except on your doctor's advice. Talk to your pediatrician regarding the use of this medicine in children. Special care may be needed. Overdosage: If you think you have taken too much of this medicine contact a poison control center or emergency room at once. NOTE: This medicine is only for you. Do not share this medicine with  others. What if I miss a dose? If you miss a dose, take it as soon as you can. If it is almost time for your next dose, take only that dose. Do not take double or extra doses. What may interact with this medicine? Do not take this medicine with any of the following medications: -eplerenone -sodium polystyrene sulfonate This medicine may also interact with the following medications: -medicines for blood pressure or heart disease like lisinopril, losartan, quinapril, valsartan -medicines for cold or allergies -medicines for inflammation like ibuprofen, indomethacin -medicines for Parkinson's disease -medicines for the stomach like metoclopramide, dicyclomine, glycopyrrolate -some diuretics This list may not describe all possible interactions. Give your health care provider a list of all the medicines, herbs, non-prescription drugs, or dietary supplements you use. Also tell them if you smoke, drink alcohol, or use illegal drugs. Some items may interact with your medicine. What should I watch for while using this medicine? Visit your doctor or health care professional for regular check ups. You will need lab work done regularly. You may need to be on a special diet while taking this medicine. Ask your doctor. What side effects may I notice from receiving  this medicine? Side effects that you should report to your doctor or health care professional as soon as possible: -allergic reactions like skin rash, itching or hives, swelling of the face, lips, or tongue -black, tarry stools -heartburn -irregular heartbeat -numbness or tingling in hands or feet -pain when swallowing -unusually weak or tired Side effects that usually do not require medical attention (report to your doctor or health care professional if they continue or are bothersome): -diarrhea -nausea -stomach gas -vomiting This list may not describe all possible side effects. Call your doctor for medical advice about side effects.  You may report side effects to FDA at 1-800-FDA-1088. Where should I keep my medicine? Keep out of the reach of children. Store at room temperature between 15 and 30 degrees C (59 and 86 degrees F ). Keep bottle closed tightly to protect this medicine from light and moisture. Throw away any unused medicine after the expiration date. NOTE: This sheet is a summary. It may not cover all possible information. If you have questions about this medicine, talk to your doctor, pharmacist, or health care provider.  2015, [employer]. (2007-10-10 11:17:31)

## 2015-03-22 NOTE — ED Notes (Signed)
Notified Dr. Preston Fleeting of Blood sugar

## 2015-03-22 NOTE — ED Notes (Signed)
Pt arrives via EMS from home with high blood sugars ongoing for 3-4 weeks. States she hasn't been able to check her sugar for the last three weeks. Also c/o lower abdominal pain for about a week. States weakness for three weeks.

## 2015-03-22 NOTE — ED Provider Notes (Signed)
CSN: [id number]     Arrival date & time 03/22/15  0534 History   First MD Initiated Contact with Patient 03/22/15 [phone]     Chief Complaint  Patient presents with  . Hyperglycemia     (Consider location/radiation/quality/duration/timing/severity/associated sxs/prior Treatment) Patient is a 64 y.o. female presenting with hyperglycemia. The history is provided by the patient.  Hyperglycemia She is actually complaining of general weakness which is going on for about a month. She states that she just wants to have enough energy to be able to clean her house. Right now, she doesn't even feel like she can walk from her bed to the bathroom. She states that she has not fallen and she denies chest pain or dyspnea. There is been no nausea. She denies fever, chills, sweats. She denies dysuria.  Past Medical History  Diagnosis Date  . Diabetes mellitus without complication   . Cerebral palsy   . Hypertension    Past Surgical History  Procedure Laterality Date  . Abdominal hysterectomy    . Mouth surgery    . Cerbral palsy     No family history on file. Social History  Substance Use Topics  . Smoking status: Never Smoker   . Smokeless tobacco: None  . Alcohol Use: No   OB History    No data available     Review of Systems  All other systems reviewed and are negative.     Allergies  Promethazine and Sulfa antibiotics  Home Medications   Prior to Admission medications   Medication Sig Start Date End Date Taking? Authorizing Provider  doxazosin (CARDURA) 2 MG tablet Take 2 mg by mouth daily.    Historical Provider, MD  FLUoxetine (PROZAC) 20 MG tablet Take 20 mg by mouth daily.    Historical Provider, MD  hydroxypropyl methylcellulose (ISOPTO TEARS) 2.5 % ophthalmic solution Place 2 drops into both eyes 3 (three) times daily as needed (for dry eyes).    Historical Provider, MD  insulin glargine (LANTUS) 100 UNIT/ML injection Inject 40 Units into the skin at bedtime.    Historical  Provider, MD  metFORMIN (GLUCOPHAGE) 850 MG tablet Take 1 tablet (850 mg total) by mouth 3 (three) times daily. 10/30/12   Dow Adolph, MD  ondansetron (ZOFRAN) 4 MG tablet Take 1 tablet (4 mg total) by mouth every 8 (eight) hours as needed for nausea. 10/30/12   Dow Adolph, MD  pantoprazole (PROTONIX) 40 MG tablet Take 1 tablet (40 mg total) by mouth daily. 10/30/12   Dow Adolph, MD  potassium chloride (K-DUR) 10 MEQ tablet Take 1 tablet (10 mEq total) by mouth daily. 10/30/12   Dow Adolph, MD  ramipril (ALTACE) 10 MG capsule Take 10 mg by mouth daily.    Historical Provider, MD  simvastatin (ZOCOR) 40 MG tablet Take 40 mg by mouth every evening.    Historical Provider, MD   BP 134/56 mmHg  Pulse 87  Temp(Src) 98.1 F (36.7 C) (Oral)  Resp 16  Ht  (1.549 m)  Wt 206 lb (93.441 kg)  BMI 38.94 kg/m2  SpO2 95% Physical Exam  Nursing note and vitals reviewed.  Morbidly obese 64 year old female, resting comfortably and in no acute distress. Vital signs are normal. Oxygen saturation is 95%, which is normal. Head is normocephalic and atraumatic. PERRLA, EOMI. Oropharynx is clear. Neck is nontender and supple without adenopathy or JVD. Back is nontender and there is no CVA tenderness. Lungs are clear without rales, wheezes, or rhonchi. Chest  is nontender. Heart has regular rate and rhythm without murmur. Abdomen is soft, flat, nontender without masses or hepatosplenomegaly and peristalsis is normoactive. Extremities have no cyanosis or edema, full range of motion is present. Skin is warm and dry without rash. Neurologic: Mental status is normal, cranial nerves are intact, there are no motor or sensory deficits.  ED Course  Procedures (including critical care time) Labs Review Results for orders placed or performed during the hospital encounter of 03/22/15  Basic metabolic panel  Result Value Ref Range   Sodium 130 (L) 135 - 145 mmol/L   Potassium 2.9 (L) 3.5 - 5.1  mmol/L   Chloride 87 (L) 101 - 111 mmol/L   CO2 31 22 - 32 mmol/L   Glucose, Bld 417 (H) 65 - 99 mg/dL   BUN 7 6 - 20 mg/dL   Creatinine, Ser 1.61 0.44 - 1.00 mg/dL   Calcium 8.2 (L) 8.9 - 10.3 mg/dL   GFR calc non Af Amer >60 >60 mL/min   GFR calc Af Amer >60 >60 mL/min   Anion gap 12 5 - 15  CBC  Result Value Ref Range   WBC 13.4 (H) 4.0 - 10.5 K/uL   RBC 3.81 (L) 3.87 - 5.11 MIL/uL   Hemoglobin 12.0 12.0 - 15.0 g/dL   HCT 09.6 (L) 04.5 - 40.9 %   MCV 94.2 78.0 - 100.0 fL   MCH 31.5 26.0 - 34.0 pg   MCHC 33.4 30.0 - 36.0 g/dL   RDW 81.1 91.4 - 78.2 %   Platelets 524 (H) 150 - 400 K/uL  Urinalysis, Routine w reflex microscopic (not at [facility])  Result Value Ref Range   Color, Urine YELLOW YELLOW   APPearance CLEAR CLEAR   Specific Gravity, Urine 1.041 (H) 1.005 - 1.030   pH 5.5 5.0 - 8.0   Glucose, UA >1000 (A) NEGATIVE mg/dL   Hgb urine dipstick TRACE (A) NEGATIVE   Bilirubin Urine NEGATIVE NEGATIVE   Ketones, ur 40 (A) NEGATIVE mg/dL   Protein, ur 30 (A) NEGATIVE mg/dL   Urobilinogen, UA 0.2 0.0 - 1.0 mg/dL   Nitrite NEGATIVE NEGATIVE   Leukocytes, UA NEGATIVE NEGATIVE  Differential  Result Value Ref Range   Neutrophils Relative % 76 43 - 77 %   Neutro Abs 10.1 (H) 1.7 - 7.7 K/uL   Lymphocytes Relative 13 12 - 46 %   Lymphs Abs 1.8 0.7 - 4.0 K/uL   Monocytes Relative 11 3 - 12 %   Monocytes Absolute 1.5 (H) 0.1 - 1.0 K/uL   Eosinophils Relative 0 0 - 5 %   Eosinophils Absolute 0.1 0.0 - 0.7 K/uL   Basophils Relative 0 0 - 1 %   Basophils Absolute 0.0 0.0 - 0.1 K/uL  Hepatic function panel  Result Value Ref Range   Total Protein 6.7 6.5 - 8.1 g/dL   Albumin 1.7 (L) 3.5 - 5.0 g/dL   AST 12 (L) 15 - 41 U/L   ALT 8 (L) 14 - 54 U/L   Alkaline Phosphatase 89 38 - 126 U/L   Total Bilirubin 0.8 0.3 - 1.2 mg/dL   Bilirubin, Direct 0.1 0.1 - 0.5 mg/dL   Indirect Bilirubin 0.7 0.3 - 0.9 mg/dL  CK  Result Value Ref Range   Total CK 17 (L) 38 - 234 U/L  Urine  microscopic-add on  Result Value Ref Range   Squamous Epithelial / LPF FEW (A) RARE   WBC, UA 11-20 <3 WBC/hpf   RBC /  HPF 0-2 <3 RBC/hpf   Bacteria, UA FEW (A) RARE  CBG monitoring, ED  Result Value Ref Range   Glucose-Capillary 379 (H) 65 - 99 mg/dL  CBG monitoring, ED  Result Value Ref Range   Glucose-Capillary 395 (H) 65 - 99 mg/dL   I, Sindy Mccune, personally reviewed and evaluated these images and lab results as part of my medical decision-making.   MDM   Final diagnoses:  Hypokalemia  Hyperglycemia    Weakness of uncertain cause. Screening labs are obtained. It is noted that she is taking a statin so CK level will also be checked.  Workup is significant for hyperglycemia and hypokalemia. Hypokalemia may be sufficient to account for her weakness. She's given potassium intravenously and a dose of oral potassium and discharged with prescription for oral potassium. She was given a dose of insulin in the ED with modest improvement in her blood sugar.  Dione Booze, MD 03/22/15 [phone]

## 2015-03-24 ENCOUNTER — Telehealth (HOSPITAL_COMMUNITY): Payer: Self-pay

## 2015-04-01 ENCOUNTER — Emergency Department (HOSPITAL_COMMUNITY)
Admission: EM | Admit: 2015-04-01 | Discharge: 2015-04-02 | Disposition: A | Discharge status: Home / Self Care | Payer: Medicaid Other | Attending: Emergency Medicine | Admitting: Emergency Medicine

## 2015-04-01 ENCOUNTER — Emergency Department (HOSPITAL_COMMUNITY): Payer: Medicaid Other

## 2015-04-01 ENCOUNTER — Encounter (HOSPITAL_COMMUNITY): Payer: Self-pay | Admitting: Emergency Medicine

## 2015-04-01 DIAGNOSIS — I10 Essential (primary) hypertension: Secondary | ICD-10-CM | POA: Diagnosis not present

## 2015-04-01 DIAGNOSIS — Y9389 Activity, other specified: Secondary | ICD-10-CM | POA: Insufficient documentation

## 2015-04-01 DIAGNOSIS — R739 Hyperglycemia, unspecified: Secondary | ICD-10-CM

## 2015-04-01 DIAGNOSIS — Y9289 Other specified places as the place of occurrence of the external cause: Secondary | ICD-10-CM | POA: Diagnosis not present

## 2015-04-01 DIAGNOSIS — S300XXA Contusion of lower back and pelvis, initial encounter: Secondary | ICD-10-CM | POA: Diagnosis not present

## 2015-04-01 DIAGNOSIS — W19XXXA Unspecified fall, initial encounter: Secondary | ICD-10-CM

## 2015-04-01 DIAGNOSIS — S3992XA Unspecified injury of lower back, initial encounter: Secondary | ICD-10-CM | POA: Diagnosis present

## 2015-04-01 DIAGNOSIS — E1165 Type 2 diabetes mellitus with hyperglycemia: Secondary | ICD-10-CM | POA: Insufficient documentation

## 2015-04-01 DIAGNOSIS — Y998 Other external cause status: Secondary | ICD-10-CM | POA: Diagnosis not present

## 2015-04-01 DIAGNOSIS — W1839XA Other fall on same level, initial encounter: Secondary | ICD-10-CM | POA: Diagnosis not present

## 2015-04-01 DIAGNOSIS — Z79899 Other long term (current) drug therapy: Secondary | ICD-10-CM | POA: Insufficient documentation

## 2015-04-01 LAB — URINALYSIS, ROUTINE W REFLEX MICROSCOPIC
Bilirubin Urine: NEGATIVE
Glucose, UA: >1000 mg/dL — AB
Hgb urine dipstick: NEGATIVE
Ketones, ur: >80 mg/dL — AB
Leukocytes, UA: NEGATIVE
Nitrite: NEGATIVE
Protein, ur: NEGATIVE mg/dL
Specific Gravity, Urine: 1.031 — ABNORMAL HIGH (ref 1.005–1.030)
Urobilinogen, UA: 0.2 mg/dL (ref 0.0–1.0)
pH: 5 (ref 5.0–8.0)

## 2015-04-01 LAB — CBC WITH DIFFERENTIAL/PLATELET
Basophils Absolute: 0 10*3/uL (ref 0.0–0.1)
Basophils Relative: 0 % (ref 0–1)
Eosinophils Absolute: 0 10*3/uL (ref 0.0–0.7)
Eosinophils Relative: 0 % (ref 0–5)
HCT: 35.5 % — ABNORMAL LOW (ref 36.0–46.0)
Hemoglobin: 11.8 g/dL — ABNORMAL LOW (ref 12.0–15.0)
Lymphocytes Relative: 9 % — ABNORMAL LOW (ref 12–46)
Lymphs Abs: 1.6 10*3/uL (ref 0.7–4.0)
MCH: 31.3 pg (ref 26.0–34.0)
MCHC: 33.2 g/dL (ref 30.0–36.0)
MCV: 94.2 fL (ref 78.0–100.0)
Monocytes Absolute: 1.2 10*3/uL — ABNORMAL HIGH (ref 0.1–1.0)
Monocytes Relative: 7 % (ref 3–12)
Neutro Abs: 14.5 10*3/uL — ABNORMAL HIGH (ref 1.7–7.7)
Neutrophils Relative %: 84 % — ABNORMAL HIGH (ref 43–77)
Platelets: 502 10*3/uL — ABNORMAL HIGH (ref 150–400)
RBC: 3.77 MIL/uL — ABNORMAL LOW (ref 3.87–5.11)
RDW: 13 % (ref 11.5–15.5)
WBC: 17.3 10*3/uL — ABNORMAL HIGH (ref 4.0–10.5)

## 2015-04-01 LAB — COMPREHENSIVE METABOLIC PANEL
ALBUMIN: 2 g/dL — AB (ref 3.5–5.0)
ALK PHOS: 129 U/L — AB (ref 38–126)
ALT: 11 U/L — AB (ref 14–54)
AST: 24 U/L (ref 15–41)
Anion gap: 21 — ABNORMAL HIGH (ref 5–15)
BILIRUBIN TOTAL: 2.6 mg/dL — AB (ref 0.3–1.2)
BUN: 18 mg/dL (ref 6–20)
CO2: 22 mmol/L (ref 22–32)
CREATININE: 1.07 mg/dL — AB (ref 0.44–1.00)
Calcium: 8.8 mg/dL — ABNORMAL LOW (ref 8.9–10.3)
Chloride: 84 mmol/L — ABNORMAL LOW (ref 101–111)
GFR calc Af Amer: >60 mL/min (ref >60)
GFR, EST NON AFRICAN AMERICAN: 54 mL/min — AB (ref >60)
GLUCOSE: 547 mg/dL — AB (ref 65–99)
Potassium: 4.6 mmol/L (ref 3.5–5.1)
Sodium: 127 mmol/L — ABNORMAL LOW (ref 135–145)
TOTAL PROTEIN: 7.8 g/dL (ref 6.5–8.1)

## 2015-04-01 LAB — URINE MICROSCOPIC-ADD ON

## 2015-04-01 LAB — CBG MONITORING, ED
Glucose-Capillary: 561 mg/dL (ref 65–99)
Glucose-Capillary: 417 mg/dL — ABNORMAL HIGH (ref 65–99)

## 2015-04-01 LAB — BETA-HYDROXYBUTYRIC ACID: Beta-Hydroxybutyric Acid: 6.82 mmol/L — ABNORMAL HIGH (ref 0.05–0.27)

## 2015-04-01 MED ORDER — INSULIN ASPART 100 UNIT/ML ~~LOC~~ SOLN
10.0000 [IU] | Freq: Once | SUBCUTANEOUS | Status: AC
  Administered 2015-04-01: 10 [IU] via INTRAVENOUS
  Filled 2015-04-01: qty 1

## 2015-04-01 MED ORDER — SODIUM CHLORIDE 0.9 % IV BOLUS (SEPSIS)
1000.0000 mL | Freq: Once | INTRAVENOUS | Status: AC
  Administered 2015-04-01: 1000 mL via INTRAVENOUS

## 2015-04-01 NOTE — ED Notes (Signed)
Pt here for unwitnessed fall with unknown down time. Pt reports hx of frequent falls. Pt reports right arm, right rib, tailbone pain. Pt wears 4L O2 PRN at home.

## 2015-04-02 LAB — CBG MONITORING, ED: Glucose-Capillary: 383 mg/dL — ABNORMAL HIGH (ref 65–99)

## 2015-04-02 MED ORDER — INSULIN ASPART 100 UNIT/ML ~~LOC~~ SOLN
10.0000 [IU] | Freq: Once | SUBCUTANEOUS | Status: AC
  Administered 2015-04-02: 10 [IU] via SUBCUTANEOUS
  Filled 2015-04-02: qty 1

## 2015-04-02 NOTE — ED Provider Notes (Signed)
Pt seen initially by Dr Blinda Leatherwood for hyperglycemia, fall.  Second visit for similar complaints.  Pt has been unable to f/u with pcm due to generalized weakness from first visit, homebound due to balance issues.  Pt ambulatory but unsteady at times.  Daughter is concerned as patient has not been taking her medications as prescribed.  Will initiate home health/physical therapy  Marisa Severin, MD 04/02/15 0157

## 2015-04-02 NOTE — ED Notes (Signed)
Pt able to ambulate with assistance to wheelchair in room.  Pt able to ambulate to car at discharge.

## 2015-04-02 NOTE — Discharge Instructions (Signed)
Tailbone Injury The tailbone (coccyx) is the small bone at the lower end of the spine. A tailbone injury may involve stretched ligaments, bruising, or a broken bone (fracture). Women are more vulnerable to this injury due to having a wider pelvis. CAUSES  This type of injury typically occurs from falling and landing on the tailbone. Repeated strain or friction from actions such as rowing and bicycling may also injure the area. The tailbone can be injured during childbirth. Infections or tumors may also press on the tailbone and cause pain. Sometimes, the cause of injury is unknown. SYMPTOMS   Bruising.  Pain when sitting.  Painful bowel movements.  In women, pain during intercourse. DIAGNOSIS  Your caregiver can diagnose a tailbone injury based on your symptoms and a physical exam. X-rays may be taken if a fracture is suspected. Your caregiver may also use an MRI scan imaging test to evaluate your symptoms. TREATMENT  Your caregiver may prescribe medicines to help relieve your pain. Most tailbone injuries heal on their own in 4 to 6 weeks. However, if the injury is caused by an infection or tumor, the recovery period may vary. PREVENTION  Wear appropriate padding and sports gear when bicycling and rowing. This can help prevent an injury from repeated strain or friction. HOME CARE INSTRUCTIONS   Put ice on the injured area.  Put ice in a plastic bag.  Place a towel between your skin and the bag.  Leave the ice on for 15-20 minutes, every hour while awake for the first 1 to 2 days.  Sit on a large, rubber or inflated ring or cushion to ease your pain. Lean forward when sitting to help decrease discomfort.  Avoid sitting for long periods of time.  Increase your activity as the pain allows.  Only take over-the-counter or prescription medicines for pain, discomfort, or fever as directed by your caregiver.  You may use stool softeners if it is painful to have a bowel movement, or as  directed by your caregiver.  Eat a diet with plenty of fiber to help prevent constipation.  Keep all follow-up appointments as directed by your caregiver. SEEK MEDICAL CARE IF:   Your pain becomes worse.  Your bowel movements cause a great deal of discomfort.  You are unable to have a bowel movement.  You have a fever. MAKE SURE YOU:  Understand these instructions.  Will watch your condition.  Will get help right away if you are not doing well or get worse. Document Released: 07/22/2000 Document Revised: 10/17/2011 Document Reviewed: 02/17/2011 [facility] Patient Information 2015 [place], [place]. This information is not intended to replace advice given to you by your health care provider. Make sure you discuss any questions you have with your health care provider.  Hyperglycemia Hyperglycemia occurs when the glucose (sugar) in your blood is too high. Hyperglycemia can happen for many reasons, but it most often happens to people who do not know they have diabetes or are not managing their diabetes properly.  CAUSES  Whether you have diabetes or not, there are other causes of hyperglycemia. Hyperglycemia can occur when you have diabetes, but it can also occur in other situations that you might not be as aware of, such as: Diabetes  If you have diabetes and are having problems controlling your blood glucose, hyperglycemia could occur because of some of the following reasons:  Not following your meal plan.  Not taking your diabetes medications or not taking it properly.  Exercising less or doing less  activity than you normally do.  Being sick. Pre-diabetes  This cannot be ignored. Before people develop Type 2 diabetes, they almost always have "pre-diabetes." This is when your blood glucose levels are higher than normal, but not yet high enough to be diagnosed as diabetes. Research has shown that some long-term damage to the body, especially the heart and circulatory system, may  already be occurring during pre-diabetes. If you take action to manage your blood glucose when you have pre-diabetes, you may delay or prevent Type 2 diabetes from developing. Stress  If you have diabetes, you may be "diet" controlled or on oral medications or insulin to control your diabetes. However, you may find that your blood glucose is higher than usual in the hospital whether you have diabetes or not. This is often referred to as "stress hyperglycemia." Stress can elevate your blood glucose. This happens because of hormones put out by the body during times of stress. If stress has been the cause of your high blood glucose, it can be followed regularly by your caregiver. That way he/she can make sure your hyperglycemia does not continue to get worse or progress to diabetes. Steroids  Steroids are medications that act on the infection fighting system (immune system) to block inflammation or infection. One side effect can be a rise in blood glucose. Most people can produce enough extra insulin to allow for this rise, but for those who cannot, steroids make blood glucose levels go even higher. It is not unusual for steroid treatments to "uncover" diabetes that is developing. It is not always possible to determine if the hyperglycemia will go away after the steroids are stopped. A special blood test called an A1c is sometimes done to determine if your blood glucose was elevated before the steroids were started. SYMPTOMS  Thirsty.  Frequent urination.  Dry mouth.  Blurred vision.  Tired or fatigue.  Weakness.  Sleepy.  Tingling in feet or leg. DIAGNOSIS  Diagnosis is made by monitoring blood glucose in one or all of the following ways:  A1c test. This is a chemical found in your blood.  Fingerstick blood glucose monitoring.  Laboratory results. TREATMENT  First, knowing the cause of the hyperglycemia is important before the hyperglycemia can be treated. Treatment may include, but is  not be limited to:  Education.  Change or adjustment in medications.  Change or adjustment in meal plan.  Treatment for an illness, infection, etc.  More frequent blood glucose monitoring.  Change in exercise plan.  Decreasing or stopping steroids.  Lifestyle changes. HOME CARE INSTRUCTIONS   Test your blood glucose as directed.  Exercise regularly. Your caregiver will give you instructions about exercise. Pre-diabetes or diabetes which comes on with stress is helped by exercising.  Eat wholesome, balanced meals. Eat often and at regular, fixed times. Your caregiver or nutritionist will give you a meal plan to guide your sugar intake.  Being at an ideal weight is important. If needed, losing as little as 10 to 15 pounds may help improve blood glucose levels. SEEK MEDICAL CARE IF:   You have questions about medicine, activity, or diet.  You continue to have symptoms (problems such as increased thirst, urination, or weight gain). SEEK IMMEDIATE MEDICAL CARE IF:   You are vomiting or have diarrhea.  Your breath smells fruity.  You are breathing faster or slower.  You are very sleepy or incoherent.  You have numbness, tingling, or pain in your feet or hands.  You have chest pain.  Your symptoms get worse even though you have been following your caregiver's orders.  If you have any other questions or concerns. Document Released: 01/18/2001 Document Revised: 10/17/2011 Document Reviewed: 11/21/2011 [facility] Patient Information 2015 [place], [place]. This information is not intended to replace advice given to you by your health care provider. Make sure you discuss any questions you have with your health care provider.

## 2015-04-02 NOTE — ED Notes (Signed)
This RN went in to give insulin and pt's daughter is in room.  Pt's daughter is angry with this RN.  Pt's daughter states she left multiple messages for previous RN in an attempt to get assistance with Child psychotherapist for placement for pt.  Pt's daughter also concerned that pt did not receive a head CT.  Will speak to MD.

## 2015-04-02 NOTE — ED Notes (Addendum)
Dr. Norlene Campbell at bedside at this time updating family.  Pt and family made aware of rationale for treatment plan today and necessity for pt to follow up with PCP.  MD to place consult to Home Health for pt, and encouraged family to follow-up with PCP for more permanent placement if needed.  Pt and family verbalized understanding and seemed appreciative of efforts.

## 2015-04-02 NOTE — ED Notes (Signed)
Pt fully dressed while RN was out of room speaking to MD and is now refusing follow-up vitals before discharge.

## 2015-04-02 NOTE — ED Provider Notes (Signed)
CSN: [id number]     Arrival date & time 04/01/15  1920 History   First MD Initiated Contact with Patient 04/01/15 1922     Chief Complaint  Patient presents with  . Hyperglycemia  . Fall     (Consider location/radiation/quality/duration/timing/severity/associated sxs/prior Treatment) HPI Comments: Patient presents to the emergency room for evaluation of tailbone pain after a fall. Patient has a history of frequent falls. She reports losing her balance in her home and falling backwards, landing into a seated position. She initially had pain in the right arm but this has completely resolved. She continues to complain of pain in her tailbone region. There was no head injury or loss of consciousness. Patient noted to be hypoglycemic by EMS. She does have a history of diabetes.  Patient is a 64 y.o. female presenting with hyperglycemia and fall.  Hyperglycemia Fall    Past Medical History  Diagnosis Date  . Diabetes mellitus without complication   . Cerebral palsy   . Hypertension    Past Surgical History  Procedure Laterality Date  . Abdominal hysterectomy    . Mouth surgery    . Cerbral palsy     History reviewed. No pertinent family history. Social History  Substance Use Topics  . Smoking status: Never Smoker   . Smokeless tobacco: None  . Alcohol Use: No   OB History    No data available     Review of Systems  Musculoskeletal:       Coccygeal pain  All other systems reviewed and are negative.     Allergies  Promethazine and Sulfa antibiotics  Home Medications   Prior to Admission medications   Medication Sig Start Date End Date Taking? Authorizing Provider  metFORMIN (GLUCOPHAGE) 850 MG tablet Take 1 tablet (850 mg total) by mouth 3 (three) times daily. 10/30/12   Dow Adolph, MD  ondansetron (ZOFRAN) 4 MG tablet Take 1 tablet (4 mg total) by mouth every 8 (eight) hours as needed for nausea. 10/30/12   Dow Adolph, MD  pantoprazole (PROTONIX) 40 MG  tablet Take 1 tablet (40 mg total) by mouth daily. 10/30/12   Dow Adolph, MD  potassium chloride (K-DUR) 10 MEQ tablet Take 1 tablet (10 mEq total) by mouth daily. 10/30/12   Dow Adolph, MD  potassium chloride SA (K-DUR,KLOR-CON) 20 MEQ tablet Take 1 tablet (20 mEq total) by mouth 2 (two) times daily. 03/22/15   Dione Booze, MD   BP 110/99 mmHg  Pulse 101  Temp(Src) 98.6 F (37 C) (Oral)  Resp 20  Ht  (1.549 m)  Wt 206 lb (93.441 kg)  BMI 38.94 kg/m2  SpO2 99% Physical Exam  Constitutional: She is oriented to person, place, and time. She appears well-developed and well-nourished. No distress.  HENT:  Head: Normocephalic and atraumatic.  Right Ear: Hearing normal.  Left Ear: Hearing normal.  Nose: Nose normal.  Mouth/Throat: Oropharynx is clear and moist and mucous membranes are normal.  Eyes: Conjunctivae and EOM are normal. Pupils are equal, round, and reactive to light.  Neck: Normal range of motion. Neck supple.  Cardiovascular: Regular rhythm, S1 normal and S2 normal.  Exam reveals no gallop and no friction rub.   No murmur heard. Pulmonary/Chest: Effort normal and breath sounds normal. No respiratory distress. She exhibits no tenderness.  Abdominal: Soft. Normal appearance and bowel sounds are normal. There is no hepatosplenomegaly. There is no tenderness. There is no rebound, no guarding, no tenderness at McBurney's point and negative Murphy's sign.  No hernia.  Musculoskeletal: Normal range of motion.       Lumbar back: She exhibits tenderness.       Back:  Neurological: She is alert and oriented to person, place, and time. She has normal strength. No cranial nerve deficit or sensory deficit. Coordination normal. GCS eye subscore is 4. GCS verbal subscore is 5. GCS motor subscore is 6.  Skin: Skin is warm, dry and intact. No rash noted. No cyanosis.  Psychiatric: She has a normal mood and affect. Her speech is normal and behavior is normal. Thought content normal.    Nursing note and vitals reviewed.   ED Course  Procedures (including critical care time) Labs Review Labs Reviewed  CBC WITH DIFFERENTIAL/PLATELET - Abnormal; Notable for the following:    WBC 17.3 (*)    RBC 3.77 (*)    Hemoglobin 11.8 (*)    HCT 35.5 (*)    Platelets 502 (*)    Neutrophils Relative % 84 (*)    Neutro Abs 14.5 (*)    Lymphocytes Relative 9 (*)    Monocytes Absolute 1.2 (*)    All other components within normal limits  COMPREHENSIVE METABOLIC PANEL - Abnormal; Notable for the following:    Sodium 127 (*)    Chloride 84 (*)    Glucose, Bld 547 (*)    Creatinine, Ser 1.07 (*)    Calcium 8.8 (*)    Albumin 2.0 (*)    ALT 11 (*)    Alkaline Phosphatase 129 (*)    Total Bilirubin 2.6 (*)    GFR calc non Af Amer 54 (*)    Anion gap 21 (*)    All other components within normal limits  BETA-HYDROXYBUTYRIC ACID - Abnormal; Notable for the following:    Beta-Hydroxybutyric Acid 6.82 (*)    All other components within normal limits  URINALYSIS, ROUTINE W REFLEX MICROSCOPIC (NOT AT [facility]) - Abnormal; Notable for the following:    APPearance CLOUDY (*)    Specific Gravity, Urine 1.031 (*)    Glucose, UA >1000 (*)    Ketones, ur >80 (*)    All other components within normal limits  CBG MONITORING, ED - Abnormal; Notable for the following:    Glucose-Capillary 417 (*)    All other components within normal limits  URINE MICROSCOPIC-ADD ON  CBG MONITORING, ED    Imaging Review Dg Chest 1 View  04/01/2015   CLINICAL DATA:  Hyperglycemia. Unwitnessed fall today. Complains of right rib pain.  EXAM: CHEST  1 VIEW  COMPARISON:  10/29/2012  FINDINGS: The heart size and mediastinal contours are within normal limits. Both lungs are clear. The visualized skeletal structures are unremarkable.  IMPRESSION: No active disease.   Electronically Signed   By: Signa Kell M.D.   On: 04/01/2015 21:16   Dg Lumbar Spine Complete  04/01/2015   CLINICAL DATA:  Hyperglycemia, fall   EXAM: LUMBAR SPINE - COMPLETE 4+ VIEW  COMPARISON:  None.  FINDINGS: There is no evidence of lumbar spine fracture. Alignment is normal. Intervertebral disc spaces are maintained.  IMPRESSION: Negative.   Electronically Signed   By: Elige Ko   On: 04/01/2015 21:15   Dg Pelvis 1-2 Views  04/01/2015   CLINICAL DATA:  Hyperglycemia and unwitnessed fall today. Sacrococcygeal pain.  EXAM: PELVIS - 1-2 VIEW  COMPARISON:  None.  FINDINGS: No evidence of pelvic fracture.  No evidence of hip fracture.  IMPRESSION: Negative.   Electronically Signed   By: Loraine Leriche  Shogry M.D.   On: 04/01/2015 21:16   Dg Sacrum/coccyx  04/01/2015   CLINICAL DATA:  Hyperglycemia and fall. Tail bone pain. Unwitnessed fall today.  EXAM: SACRUM AND COCCYX - 2+ VIEW  COMPARISON:  None.  FINDINGS: No evidence of sacral or coccygeal fracture. Lateral views are markedly degraded by overlying artifact because of patient positioning.  IMPRESSION: No fracture seen. Coccygeal detail is poor because of overlying artifact.   Electronically Signed   By: Paulina Fusi M.D.   On: 04/01/2015 21:16   I have personally reviewed and evaluated these images and lab results as part of my medical decision-making.   EKG Interpretation None      MDM   Final diagnoses:  Coccyx contusion, initial encounter  Hyperglycemia    Patient presents to the ER for evaluation of lower back and coccygeal area tenderness after a fall. She did not hit her head, no loss of consciousness. No hematoma of the head. No neck or upper back pain. She did have diffuse lower back and Decidual area tenderness. No skin breakdown. X-rays are negative.  Patient also found to be hyperglycemic. She does not have evidence of DKA. Patient administered IV fluids, insulin with resolution.    Gilda Crease, MD 04/02/15 [phone]

## 2015-04-02 NOTE — ED Notes (Signed)
Pt and daughter verbalized understanding of all discharge paperwork, follow up needs, and referrals placed.

## 2015-04-05 ENCOUNTER — Encounter (HOSPITAL_COMMUNITY): Payer: Self-pay | Admitting: Emergency Medicine

## 2015-04-05 ENCOUNTER — Emergency Department (HOSPITAL_COMMUNITY): Payer: Medicaid Other

## 2015-04-05 ENCOUNTER — Inpatient Hospital Stay (HOSPITAL_COMMUNITY)
Admission: EM | Admit: 2015-04-05 | Discharge: 2015-04-08 | DRG: 639 | Disposition: A | Discharge status: Skilled Nursing Facility | Payer: Medicaid Other | Attending: Internal Medicine | Admitting: Internal Medicine

## 2015-04-05 ENCOUNTER — Inpatient Hospital Stay (HOSPITAL_COMMUNITY): Payer: Medicaid Other

## 2015-04-05 DIAGNOSIS — Z79899 Other long term (current) drug therapy: Secondary | ICD-10-CM

## 2015-04-05 DIAGNOSIS — Z833 Family history of diabetes mellitus: Secondary | ICD-10-CM | POA: Diagnosis not present

## 2015-04-05 DIAGNOSIS — F4000 Agoraphobia, unspecified: Secondary | ICD-10-CM | POA: Diagnosis present

## 2015-04-05 DIAGNOSIS — D72829 Elevated white blood cell count, unspecified: Secondary | ICD-10-CM | POA: Diagnosis present

## 2015-04-05 DIAGNOSIS — I10 Essential (primary) hypertension: Secondary | ICD-10-CM | POA: Diagnosis present

## 2015-04-05 DIAGNOSIS — R627 Adult failure to thrive: Secondary | ICD-10-CM | POA: Diagnosis present

## 2015-04-05 DIAGNOSIS — R29810 Facial weakness: Secondary | ICD-10-CM | POA: Diagnosis present

## 2015-04-05 DIAGNOSIS — F329 Major depressive disorder, single episode, unspecified: Secondary | ICD-10-CM | POA: Diagnosis present

## 2015-04-05 DIAGNOSIS — E0810 Diabetes mellitus due to underlying condition with ketoacidosis without coma: Secondary | ICD-10-CM | POA: Insufficient documentation

## 2015-04-05 DIAGNOSIS — E119 Type 2 diabetes mellitus without complications: Secondary | ICD-10-CM | POA: Diagnosis present

## 2015-04-05 DIAGNOSIS — Z8249 Family history of ischemic heart disease and other diseases of the circulatory system: Secondary | ICD-10-CM | POA: Diagnosis not present

## 2015-04-05 DIAGNOSIS — E785 Hyperlipidemia, unspecified: Secondary | ICD-10-CM | POA: Diagnosis present

## 2015-04-05 DIAGNOSIS — Z803 Family history of malignant neoplasm of breast: Secondary | ICD-10-CM | POA: Diagnosis not present

## 2015-04-05 DIAGNOSIS — Z794 Long term (current) use of insulin: Secondary | ICD-10-CM | POA: Diagnosis not present

## 2015-04-05 DIAGNOSIS — G809 Cerebral palsy, unspecified: Secondary | ICD-10-CM | POA: Diagnosis present

## 2015-04-05 DIAGNOSIS — R5381 Other malaise: Secondary | ICD-10-CM | POA: Diagnosis not present

## 2015-04-05 DIAGNOSIS — W19XXXA Unspecified fall, initial encounter: Secondary | ICD-10-CM

## 2015-04-05 DIAGNOSIS — Z7982 Long term (current) use of aspirin: Secondary | ICD-10-CM

## 2015-04-05 DIAGNOSIS — R110 Nausea: Secondary | ICD-10-CM

## 2015-04-05 DIAGNOSIS — Z87440 Personal history of urinary (tract) infections: Secondary | ICD-10-CM

## 2015-04-05 DIAGNOSIS — E1310 Other specified diabetes mellitus with ketoacidosis without coma: Secondary | ICD-10-CM | POA: Diagnosis present

## 2015-04-05 DIAGNOSIS — L309 Dermatitis, unspecified: Secondary | ICD-10-CM | POA: Diagnosis present

## 2015-04-05 DIAGNOSIS — R739 Hyperglycemia, unspecified: Secondary | ICD-10-CM

## 2015-04-05 DIAGNOSIS — F411 Generalized anxiety disorder: Secondary | ICD-10-CM | POA: Diagnosis present

## 2015-04-05 DIAGNOSIS — E1110 Type 2 diabetes mellitus with ketoacidosis without coma: Secondary | ICD-10-CM | POA: Diagnosis present

## 2015-04-05 DIAGNOSIS — S301XXA Contusion of abdominal wall, initial encounter: Secondary | ICD-10-CM | POA: Diagnosis not present

## 2015-04-05 DIAGNOSIS — R531 Weakness: Secondary | ICD-10-CM | POA: Diagnosis present

## 2015-04-05 LAB — URINALYSIS, ROUTINE W REFLEX MICROSCOPIC
BILIRUBIN URINE: NEGATIVE
Ketones, ur: >80 mg/dL — AB
Leukocytes, UA: NEGATIVE
Nitrite: NEGATIVE
PROTEIN: NEGATIVE mg/dL
Specific Gravity, Urine: 1.029 (ref 1.005–1.030)
UROBILINOGEN UA: 1 mg/dL (ref 0.0–1.0)
pH: 5 (ref 5.0–8.0)

## 2015-04-05 LAB — BASIC METABOLIC PANEL
ANION GAP: 13 (ref 5–15)
BUN: 15 mg/dL (ref 6–20)
CALCIUM: 7.8 mg/dL — AB (ref 8.9–10.3)
CO2: 16 mmol/L — AB (ref 22–32)
CREATININE: 0.66 mg/dL (ref 0.44–1.00)
Chloride: 102 mmol/L (ref 101–111)
Glucose, Bld: 291 mg/dL — ABNORMAL HIGH (ref 65–99)
Potassium: 4.1 mmol/L (ref 3.5–5.1)
SODIUM: 131 mmol/L — AB (ref 135–145)

## 2015-04-05 LAB — CBC
HCT: 31.8 % — ABNORMAL LOW (ref 36.0–46.0)
HEMATOCRIT: 35.8 % — AB (ref 36.0–46.0)
HEMOGLOBIN: 11.5 g/dL — AB (ref 12.0–15.0)
HEMOGLOBIN: 10.1 g/dL — AB (ref 12.0–15.0)
MCH: 31.2 pg (ref 26.0–34.0)
MCH: 30.3 pg (ref 26.0–34.0)
MCHC: 32.1 g/dL (ref 30.0–36.0)
MCHC: 31.8 g/dL (ref 30.0–36.0)
MCV: 97 fL (ref 78.0–100.0)
MCV: 95.5 fL (ref 78.0–100.0)
PLATELETS: 447 10*3/uL — AB (ref 150–400)
Platelets: 456 10*3/uL — ABNORMAL HIGH (ref 150–400)
RBC: 3.69 MIL/uL — AB (ref 3.87–5.11)
RBC: 3.33 MIL/uL — AB (ref 3.87–5.11)
RDW: 13.5 % (ref 11.5–15.5)
RDW: 13.3 % (ref 11.5–15.5)
WBC: 19.6 10*3/uL — AB (ref 4.0–10.5)
WBC: 18.5 10*3/uL — AB (ref 4.0–10.5)

## 2015-04-05 LAB — COMPREHENSIVE METABOLIC PANEL
ALK PHOS: 136 U/L — AB (ref 38–126)
ALT: 11 U/L — AB (ref 14–54)
AST: 14 U/L — AB (ref 15–41)
Albumin: 2.1 g/dL — ABNORMAL LOW (ref 3.5–5.0)
Anion gap: 19 — ABNORMAL HIGH (ref 5–15)
BILIRUBIN TOTAL: 1.5 mg/dL — AB (ref 0.3–1.2)
BUN: 17 mg/dL (ref 6–20)
CALCIUM: 8.5 mg/dL — AB (ref 8.9–10.3)
CHLORIDE: 94 mmol/L — AB (ref 101–111)
CO2: 17 mmol/L — ABNORMAL LOW (ref 22–32)
CREATININE: 0.8 mg/dL (ref 0.44–1.00)
Glucose, Bld: 536 mg/dL — ABNORMAL HIGH (ref 65–99)
Potassium: 4.4 mmol/L (ref 3.5–5.1)
Sodium: 130 mmol/L — ABNORMAL LOW (ref 135–145)
Total Protein: 7.2 g/dL (ref 6.5–8.1)

## 2015-04-05 LAB — URINE MICROSCOPIC-ADD ON

## 2015-04-05 LAB — CK: CK TOTAL: 35 U/L — AB (ref 38–234)

## 2015-04-05 LAB — GLUCOSE, CAPILLARY
GLUCOSE-CAPILLARY: 291 mg/dL — AB (ref 65–99)
GLUCOSE-CAPILLARY: 308 mg/dL — AB (ref 65–99)

## 2015-04-05 LAB — TROPONIN I

## 2015-04-05 MED ORDER — SODIUM CHLORIDE 0.9 % IV BOLUS (SEPSIS)
1000.0000 mL | Freq: Once | INTRAVENOUS | Status: AC
  Administered 2015-04-05: 1000 mL via INTRAVENOUS

## 2015-04-05 MED ORDER — INFLUENZA VAC SPLIT QUAD 0.5 ML IM SUSY
0.5000 mL | PREFILLED_SYRINGE | INTRAMUSCULAR | Status: AC
  Administered 2015-04-06: 0.5 mL via INTRAMUSCULAR
  Filled 2015-04-05 (×3): qty 0.5

## 2015-04-05 MED ORDER — ASPIRIN 81 MG PO CHEW
81.0000 mg | CHEWABLE_TABLET | Freq: Every day | ORAL | Status: DC
  Administered 2015-04-05 – 2015-04-08 (×4): 81 mg via ORAL
  Filled 2015-04-05 (×4): qty 1

## 2015-04-05 MED ORDER — SODIUM CHLORIDE 0.9 % IV SOLN
INTRAVENOUS | Status: DC
  Administered 2015-04-05: 22:00:00 via INTRAVENOUS

## 2015-04-05 MED ORDER — ENOXAPARIN SODIUM 40 MG/0.4ML ~~LOC~~ SOLN
40.0000 mg | SUBCUTANEOUS | Status: DC
  Administered 2015-04-06 – 2015-04-07 (×3): 40 mg via SUBCUTANEOUS
  Filled 2015-04-05 (×4): qty 0.4

## 2015-04-05 MED ORDER — HYDRALAZINE HCL 20 MG/ML IJ SOLN: 10.0000 mg | INTRAMUSCULAR | Status: DC | PRN

## 2015-04-05 MED ORDER — SODIUM CHLORIDE 0.9 % IV SOLN
INTRAVENOUS | Status: DC
  Administered 2015-04-05: 21:00:00 via INTRAVENOUS

## 2015-04-05 MED ORDER — DEXTROSE-NACL 5-0.45 % IV SOLN
INTRAVENOUS | Status: DC
  Administered 2015-04-06: via INTRAVENOUS

## 2015-04-05 MED ORDER — SODIUM CHLORIDE 0.9 % IV SOLN
INTRAVENOUS | Status: DC
  Administered 2015-04-05: 2.3 [IU]/h via INTRAVENOUS
  Administered 2015-04-06: 5.3 [IU]/h via INTRAVENOUS
  Filled 2015-04-05: qty 2.5

## 2015-04-05 MED ORDER — INSULIN ASPART 100 UNIT/ML ~~LOC~~ SOLN
16.0000 [IU] | Freq: Once | SUBCUTANEOUS | Status: AC
  Administered 2015-04-05: 16 [IU] via SUBCUTANEOUS
  Filled 2015-04-05: qty 1

## 2015-04-05 MED ORDER — POTASSIUM CHLORIDE 10 MEQ/100ML IV SOLN
10.0000 meq | INTRAVENOUS | Status: AC
  Administered 2015-04-05 – 2015-04-06 (×4): 10 meq via INTRAVENOUS
  Filled 2015-04-05 (×2): qty 100

## 2015-04-05 NOTE — ED Provider Notes (Signed)
CSN: [id number]     Arrival date & time 04/05/15  1554 History   First MD Initiated Contact with Patient 04/05/15 1616     Chief Complaint  Patient presents with  . Leg Pain     (Consider location/radiation/quality/duration/timing/severity/associated sxs/prior Treatment) Patient is a 64 y.o. female presenting with leg pain.  Leg Pain Associated symptoms: no back pain, no fever and no neck pain   Patient c/o bilateral leg weakness, which prevented her from standing up off of toilet just pta today.  Pt indicates hx cerebral palsy, w right sided weakness since birth, and that recently she has had increasing difficulty w ambulating due to gen weakness, and bil leg weakness.  Today, was able to walk around her apartment w walker slowly, and got self into bathroom on toilet, but states when she went to get up from toilet, she couldn't stand and had to call EMS for assistance. No fall. No urinary retention, or incontinence. Denies saddle area numbness. States after sitting on toilet for a couple hours, her toes felt numb/tingly. Denies abrupt worsening or new leg weakness. No fever or chills. Denies change in speech or vision. No unilateral numbness/weakness.      Past Medical History  Diagnosis Date  . Diabetes mellitus without complication   . Cerebral palsy   . Hypertension    Past Surgical History  Procedure Laterality Date  . Abdominal hysterectomy    . Mouth surgery    . Cerbral palsy     History reviewed. No pertinent family history. Social History  Substance Use Topics  . Smoking status: Never Smoker   . Smokeless tobacco: None  . Alcohol Use: No   OB History    No data available     Review of Systems  Constitutional: Negative for fever and chills.  HENT: Negative for sore throat.   Eyes: Negative for redness.  Respiratory: Negative for shortness of breath.   Cardiovascular: Negative for chest pain.  Gastrointestinal: Negative for vomiting, abdominal pain and diarrhea.   Genitourinary: Negative for dysuria and flank pain.  Musculoskeletal: Negative for back pain and neck pain.  Skin: Negative for rash.  Neurological: Positive for weakness. Negative for syncope, speech difficulty and headaches.  Hematological: Does not bruise/bleed easily.  Psychiatric/Behavioral: Negative for confusion.      Allergies  Promethazine and Sulfa antibiotics  Home Medications   Prior to Admission medications   Medication Sig Start Date End Date Taking? Authorizing Provider  aspirin 81 MG chewable tablet Chew 81 mg by mouth daily.   Yes Historical Provider, MD  atorvastatin (LIPITOR) 40 MG tablet Take 40 mg by mouth daily.   Yes Historical Provider, MD  Cholecalciferol (VITAMIN D3 PO) Take 1 tablet by mouth daily. 100 units/tablet   Yes Historical Provider, MD  FLUoxetine (PROZAC) 40 MG capsule Take 40 mg by mouth daily.   Yes Historical Provider, MD  Inositol Niacinate (NO FLUSH NIACIN) 500 MG TABS Take 1 tablet by mouth daily.   Yes Historical Provider, MD  insulin detemir (LEVEMIR) 100 UNIT/ML injection Inject 36 Units into the skin at bedtime.   Yes Historical Provider, MD  lisinopril-hydrochlorothiazide (PRINZIDE,ZESTORETIC) 20-12.5 MG per tablet Take 1 tablet by mouth daily.   Yes Historical Provider, MD  Omega-3 Fatty Acids (FISH OIL) 1200 MG CAPS Take 1 capsule by mouth daily.   Yes Historical Provider, MD  potassium chloride SA (K-DUR,KLOR-CON) 20 MEQ tablet Take 1 tablet (20 mEq total) by mouth 2 (two) times daily. 03/22/15  Yes Dione Booze, MD  metFORMIN (GLUCOPHAGE) 850 MG tablet Take 1 tablet (850 mg total) by mouth 3 (three) times daily. Patient not taking: Reported on 04/02/2015 10/30/12   Dow Adolph, MD  Neo-Bacit-Poly-Lidocaine (FIRST AID PLUS LIDOCAINE EX) Apply 1 application topically as needed (apply to broken skin areas).    Historical Provider, MD  ondansetron (ZOFRAN) 4 MG tablet Take 1 tablet (4 mg total) by mouth every 8 (eight) hours as needed for  nausea. Patient not taking: Reported on 04/02/2015 10/30/12   Dow Adolph, MD  pantoprazole (PROTONIX) 40 MG tablet Take 1 tablet (40 mg total) by mouth daily. Patient not taking: Reported on 04/02/2015 10/30/12   Dow Adolph, MD  potassium chloride (K-DUR) 10 MEQ tablet Take 1 tablet (10 mEq total) by mouth daily. Patient not taking: Reported on 04/02/2015 10/30/12   Dow Adolph, MD   BP 116/57 mmHg  Pulse 90  Temp(Src) 98 F (36.7 C) (Oral)  Resp 22  SpO2 90% Physical Exam  Constitutional: She is oriented to person, place, and time. She appears well-developed and well-nourished. No distress.  HENT:  Head: Atraumatic.  Mouth/Throat: Oropharynx is clear and moist.  Eyes: Conjunctivae are normal. No scleral icterus.  Neck: Neck supple. No tracheal deviation present.  Cardiovascular: Normal rate, regular rhythm, normal heart sounds and intact distal pulses.   Pulmonary/Chest: Effort normal and breath sounds normal. No respiratory distress.  Abdominal: Soft. Normal appearance and bowel sounds are normal. She exhibits no distension. There is no tenderness.  Genitourinary:  Intact perianal sensation, normal rectal tone. V small, approx 1 cm, superficial sacral ulcer without acute infection.   Musculoskeletal: She exhibits no edema or tenderness.  TLS spine non tender.   Neurological: She is alert and oriented to person, place, and time. No cranial nerve deficit.  Speech clear, fluent. Intact rectal tone and perianal sensation. Motor RUE/RLE 4/5, pt indicates is baseline.  LUE/LLE 5/5. sens grossly intact bil lower ext.   Skin: Skin is warm and dry. No rash noted. She is not diaphoretic.  Psychiatric: She has a normal mood and affect.  Nursing note and vitals reviewed.   ED Course  Procedures (including critical care time) Labs Review  Results for orders placed or performed during the hospital encounter of 04/05/15  CBC  Result Value Ref Range   WBC 19.6 (H) 4.0 - 10.5 K/uL    RBC 3.69 (L) 3.87 - 5.11 MIL/uL   Hemoglobin 11.5 (L) 12.0 - 15.0 g/dL   HCT 16.1 (L) 09.6 - 04.5 %   MCV 97.0 78.0 - 100.0 fL   MCH 31.2 26.0 - 34.0 pg   MCHC 32.1 30.0 - 36.0 g/dL   RDW 40.9 81.1 - 91.4 %   Platelets 447 (H) 150 - 400 K/uL  Comprehensive metabolic panel  Result Value Ref Range   Sodium 130 (L) 135 - 145 mmol/L   Potassium 4.4 3.5 - 5.1 mmol/L   Chloride 94 (L) 101 - 111 mmol/L   CO2 17 (L) 22 - 32 mmol/L   Glucose, Bld 536 (H) 65 - 99 mg/dL   BUN 17 6 - 20 mg/dL   Creatinine, Ser 7.82 0.44 - 1.00 mg/dL   Calcium 8.5 (L) 8.9 - 10.3 mg/dL   Total Protein 7.2 6.5 - 8.1 g/dL   Albumin 2.1 (L) 3.5 - 5.0 g/dL   AST 14 (L) 15 - 41 U/L   ALT 11 (L) 14 - 54 U/L   Alkaline Phosphatase 136 (H) 38 -  126 U/L   Total Bilirubin 1.5 (H) 0.3 - 1.2 mg/dL   GFR calc non Af Amer >60 >60 mL/min   GFR calc Af Amer >60 >60 mL/min   Anion gap 19 (H) 5 - 15  Urinalysis, Routine w reflex microscopic (not at [facility])  Result Value Ref Range   Color, Urine YELLOW YELLOW   APPearance CLOUDY (A) CLEAR   Specific Gravity, Urine 1.029 1.005 - 1.030   pH 5.0 5.0 - 8.0   Glucose, UA >1000 (A) NEGATIVE mg/dL   Hgb urine dipstick TRACE (A) NEGATIVE   Bilirubin Urine NEGATIVE NEGATIVE   Ketones, ur >80 (A) NEGATIVE mg/dL   Protein, ur NEGATIVE NEGATIVE mg/dL   Urobilinogen, UA 1.0 0.0 - 1.0 mg/dL   Nitrite NEGATIVE NEGATIVE   Leukocytes, UA NEGATIVE NEGATIVE  Urine microscopic-add on  Result Value Ref Range   Squamous Epithelial / LPF RARE RARE   WBC, UA 3-6 <3 WBC/hpf   Bacteria, UA RARE RARE   Urine-Other AMORPHOUS URATES/PHOSPHATES    Dg Chest 1 View  04/01/2015   CLINICAL DATA:  Hyperglycemia. Unwitnessed fall today. Complains of right rib pain.  EXAM: CHEST  1 VIEW  COMPARISON:  10/29/2012  FINDINGS: The heart size and mediastinal contours are within normal limits. Both lungs are clear. The visualized skeletal structures are unremarkable.  IMPRESSION: No active disease.    Electronically Signed   By: Signa Kell M.D.   On: 04/01/2015 21:16   Dg Chest 2 View  04/05/2015   CLINICAL DATA:  Weakness and hypertension.  Diabetic.  EXAM: CHEST  2 VIEW  COMPARISON:  04/01/2015 and 10/30/2014  FINDINGS: Patient is rotated to the right. Lungs are somewhat hypoinflated without definite consolidation or effusion. Cardiomediastinal silhouette and remainder of the exam is unchanged.  IMPRESSION: No active cardiopulmonary disease.   Electronically Signed   By: Elberta Fortis M.D.   On: 04/05/2015 18:55   Dg Lumbar Spine Complete  04/01/2015   CLINICAL DATA:  Hyperglycemia, fall  EXAM: LUMBAR SPINE - COMPLETE 4+ VIEW  COMPARISON:  None.  FINDINGS: There is no evidence of lumbar spine fracture. Alignment is normal. Intervertebral disc spaces are maintained.  IMPRESSION: Negative.   Electronically Signed   By: Elige Ko   On: 04/01/2015 21:15   Dg Pelvis 1-2 Views  04/01/2015   CLINICAL DATA:  Hyperglycemia and unwitnessed fall today. Sacrococcygeal pain.  EXAM: PELVIS - 1-2 VIEW  COMPARISON:  None.  FINDINGS: No evidence of pelvic fracture.  No evidence of hip fracture.  IMPRESSION: Negative.   Electronically Signed   By: Paulina Fusi M.D.   On: 04/01/2015 21:16   Dg Sacrum/coccyx  04/01/2015   CLINICAL DATA:  Hyperglycemia and fall. Tail bone pain. Unwitnessed fall today.  EXAM: SACRUM AND COCCYX - 2+ VIEW  COMPARISON:  None.  FINDINGS: No evidence of sacral or coccygeal fracture. Lateral views are markedly degraded by overlying artifact because of patient positioning.  IMPRESSION: No fracture seen. Coccygeal detail is poor because of overlying artifact.   Electronically Signed   By: Paulina Fusi M.D.   On: 04/01/2015 21:16      I have personally reviewed and evaluated these images and lab results as part of my medical decision-making.    MDM   Labs.  Reviewed nursing notes and prior charts for additional history.   Iv ns bolus.   hco3 mildly low, anion gap mildly  high 19.  Suspect mild dka, given mild elev gap, no  nv, feel will respond to ns boluses and subcut insulin.  Given recent ed visits, falls, and challenges managing, may also end up needing ecf placement, after dka treated.  Discussed w hospitalist who requests admit to stepdown, temp orders.       Cathren Laine, MD 04/05/15 [phone]
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
## 2015-04-05 NOTE — Progress Notes (Signed)
Per Dr. Toniann Fail ok to give Lovenox

## 2015-04-05 NOTE — ED Notes (Signed)
Per report to floor this writer will send pt to CT prior to coming to floor,

## 2015-04-05 NOTE — ED Notes (Signed)
Patient came in soiled. Patient asked staff to cut off clothing and discard it into the trash. This tech verified twice that patient didn't want belongings. Patient then agreed again to throw away all items since they were soiled.

## 2015-04-05 NOTE — ED Notes (Signed)
Bed: WA20 Expected date:  Expected time:  Means of arrival:  Comments: EMS- 64yo F, BLE/Tailbone pain

## 2015-04-05 NOTE — ED Notes (Signed)
EMS states that the Pt did not have a cell phone or eyeglasses that they saw-only a handbag.  They are unsure what the contents of that bag were.

## 2015-04-05 NOTE — ED Notes (Signed)
Pt c/o chronic BLE pain and chronic tailbone pain.  WLED Tech visualized pressure sores in the coccyx area.  BP110/70 P100 RR26  Pain 10/10

## 2015-04-05 NOTE — H&P (Signed)
[facility] History and Physical  Destiny Cole:[id number] DOB: 1951-01-30 DOA: 04/05/2015  Referring physician: Dr.Steinl. PCP: Egbert Garibaldi, NP  Specialists: None.  Chief Complaint: Weakness.  HPI: Destiny Cole is a 64 y.o. female with history of diabetes mellitus type 2, cerebral palsy with right-sided weakness, hypertension, hyperlipidemia was brought to the ER after patient was found to have difficulty to get out of her commode. Patient states she has been feeling weak over the last 1 month and her blood sugars remain consistently running high. She states she has been taking her medications. Today patient went to the bathroom and after which patient found it very difficult to get up from the position. Patient was brought to the ER. Labs revealed patient is in DKA. Otherwise for the patient's known weakness on the right side from cerebral palsy patient did not have any new deficits. Patient was complaining of pain in the coccygeal area for which x-rays done did not show any acute fracture. Patient has been admitted for diabetic ketoacidosis. Patient denies any chest pain shortness of breath had mild nausea denies any abdominal pain or diarrhea. Patient states she did have one episode of diarrhea 3 days ago.   Review of Systems: As presented in the history of presenting illness, rest negative.  Past Medical History  Diagnosis Date  . Diabetes mellitus without complication   . Cerebral palsy   . Hypertension    Past Surgical History  Procedure Laterality Date  . Abdominal hysterectomy    . Mouth surgery    . Cerbral palsy     Social History:  reports that she has never smoked. She does not have any smokeless tobacco history on file. She reports that she does not drink alcohol or use illicit drugs. Where does patient live home. Can patient participate in ADLs? Yes.  Allergies  Allergen Reactions  . Promethazine     She has a possible respiratory reaction  with this medication with RR 4 and hypoxia a few minutes after administration. Other respiratory depressants like benzos should be used with caution.  . Sulfa Antibiotics Swelling and Rash    Family History:  Family History  Problem Relation Age of Onset  . Breast cancer Mother   . Hypertension Mother   . Diabetes Mellitus II Maternal Aunt       Prior to Admission medications   Medication Sig Start Date End Date Taking? Authorizing Provider  aspirin 81 MG chewable tablet Chew 81 mg by mouth daily.   Yes Historical Provider, MD  atorvastatin (LIPITOR) 40 MG tablet Take 40 mg by mouth daily.   Yes Historical Provider, MD  Cholecalciferol (VITAMIN D3 PO) Take 1 tablet by mouth daily. 100 units/tablet   Yes Historical Provider, MD  FLUoxetine (PROZAC) 40 MG capsule Take 40 mg by mouth daily.   Yes Historical Provider, MD  Inositol Niacinate (NO FLUSH NIACIN) 500 MG TABS Take 1 tablet by mouth daily.   Yes Historical Provider, MD  insulin detemir (LEVEMIR) 100 UNIT/ML injection Inject 36 Units into the skin at bedtime.   Yes Historical Provider, MD  lisinopril-hydrochlorothiazide (PRINZIDE,ZESTORETIC) 20-12.5 MG per tablet Take 1 tablet by mouth daily.   Yes Historical Provider, MD  Omega-3 Fatty Acids (FISH OIL) 1200 MG CAPS Take 1 capsule by mouth daily.   Yes Historical Provider, MD  potassium chloride SA (K-DUR,KLOR-CON) 20 MEQ tablet Take 1 tablet (20 mEq total) by mouth 2 (two) times daily. 03/22/15  Yes Dione Booze, MD  metFORMIN (GLUCOPHAGE) 850 MG tablet Take 1 tablet (850 mg total) by mouth 3 (three) times daily. Patient not taking: Reported on 04/02/2015 10/30/12   Dow Adolph, MD  Neo-Bacit-Poly-Lidocaine (FIRST AID PLUS LIDOCAINE EX) Apply 1 application topically as needed (apply to broken skin areas).    Historical Provider, MD  ondansetron (ZOFRAN) 4 MG tablet Take 1 tablet (4 mg total) by mouth every 8 (eight) hours as needed for nausea. Patient not taking: Reported on  04/02/2015 10/30/12   Dow Adolph, MD  pantoprazole (PROTONIX) 40 MG tablet Take 1 tablet (40 mg total) by mouth daily. Patient not taking: Reported on 04/02/2015 10/30/12   Dow Adolph, MD  potassium chloride (K-DUR) 10 MEQ tablet Take 1 tablet (10 mEq total) by mouth daily. Patient not taking: Reported on 04/02/2015 10/30/12   Dow Adolph, MD    Physical Exam: Filed Vitals:   04/05/15 1612 04/05/15 1627 04/05/15 1827  BP: 116/57  128/64  Pulse: 90  117  Temp: 98 F (36.7 C)  97.7 F (36.5 C)  TempSrc: Oral  Oral  Resp: 22  18  Height:  5\' 1"  (1.549 m)   Weight:  93.441 kg (206 lb)   SpO2: 90%  99%     General:  Moderately built and nourished.  Eyes: Anicteric no pallor.  ENT: No discharge from the ears eyes nose and mouth.  Neck: No mass felt. No JVD appreciated.  Cardiovascular: S1 and S2 heard.  Respiratory: No rhonchi or crepitations.  Abdomen: Soft nontender bowel sounds present.  Skin: Stage II decubitus in the sacral area and also skin maceration around the right lower quadrant.  Musculoskeletal: No edema.  Psychiatric: Appears normal.  Neurologic: Alert awake oriented to time place and person. Right-sided weakness from known history of cerebral palsy.  Labs on Admission:  Basic Metabolic Panel:  Recent Labs Lab 04/01/15 1955 04/05/15 1717  NA 127* 130*  K 4.6 4.4  CL 84* 94*  CO2 22 17*  GLUCOSE 547* 536*  BUN 18 17  CREATININE 1.07* 0.80  CALCIUM 8.8* 8.5*   Liver Function Tests:  Recent Labs Lab 04/01/15 1955 04/05/15 1717  AST 24 14*  ALT 11* 11*  ALKPHOS 129* 136*  BILITOT 2.6* 1.5*  PROT 7.8 7.2  ALBUMIN 2.0* 2.1*   No results for input(s): LIPASE, AMYLASE in the last 168 hours. No results for input(s): AMMONIA in the last 168 hours. CBC:  Recent Labs Lab 04/01/15 1955 04/05/15 1717  WBC 17.3* 19.6*  NEUTROABS 14.5*  --   HGB 11.8* 11.5*  HCT 35.5* 35.8*  MCV 94.2 97.0  PLT 502* 447*   Cardiac Enzymes: No  results for input(s): CKTOTAL, CKMB, CKMBINDEX, TROPONINI in the last 168 hours.  BNP (last 3 results) No results for input(s): BNP in the last 8760 hours.  ProBNP (last 3 results) No results for input(s): PROBNP in the last 8760 hours.  CBG:  Recent Labs Lab 04/01/15 1919 04/01/15 2230 04/01/15 2330  GLUCAP 561* 417* 383*    Radiological Exams on Admission: Dg Chest 2 View  04/05/2015   CLINICAL DATA:  Weakness and hypertension.  Diabetic.  EXAM: CHEST  2 VIEW  COMPARISON:  04/01/2015 and 10/30/2014  FINDINGS: Patient is rotated to the right. Lungs are somewhat hypoinflated without definite consolidation or effusion. Cardiomediastinal silhouette and remainder of the exam is unchanged.  IMPRESSION: No active cardiopulmonary disease.   Electronically Signed   By: Elberta Fortis M.D.   On: 04/05/2015 18:55  EKG: Independently reviewed. Normal sinus rhythm.  Assessment/Plan Principal Problem:   DKA, type 2 Active Problems:   Hypertension   Hyperlipidemia   1. Diabetic ketoacidosis and type II - I have placed patient on insulin infusion with IV fluids. Once anion gap is corrected change to patient's home dose of Levemir. Check hemoglobin A1c. Not sure what precipitated patient's DKA. 2. Deconditioning - I have consulted physical therapy. Patient probably may need rehabilitation placement. 3. Elevated LFTs - patient is complaining of some nausea. Follow LFTs. Check sonogram of the abdomen. 4. Leukocytosis - probably reactionary. Patient afebrile. Closely follow CBC. 5. Hypertension - once patient can take orally continue home medications. 6. Sacral decubitus ulcer stage II - wound team consult requested. Patient also has skin changes in the right groin area. 7. Hyperlipidemia - continue statins. 8. Cerebral palsy.  I have reviewed patient's old chart and labs. Personally reviewed patient's chest x-ray and EKG.   DVT Prophylaxis Lovenox.  Code Status: DO NOT INTUBATE.   Family Communication: Discussed with patient's daughter.  Disposition Plan: Admit to inpatient to stepdown.    Lu[address]elipe [address] [facility] Pager [phone].  If 7PM-7AM, please contact night-coverage [email or web address] Password [email or web address] 04/05/2015, 8:56 PM
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
## 2015-04-06 ENCOUNTER — Inpatient Hospital Stay (HOSPITAL_COMMUNITY): Payer: Medicaid Other

## 2015-04-06 DIAGNOSIS — E785 Hyperlipidemia, unspecified: Secondary | ICD-10-CM

## 2015-04-06 DIAGNOSIS — D72829 Elevated white blood cell count, unspecified: Secondary | ICD-10-CM | POA: Diagnosis present

## 2015-04-06 DIAGNOSIS — R29810 Facial weakness: Secondary | ICD-10-CM | POA: Diagnosis present

## 2015-04-06 DIAGNOSIS — F419 Anxiety disorder, unspecified: Secondary | ICD-10-CM

## 2015-04-06 DIAGNOSIS — F411 Generalized anxiety disorder: Secondary | ICD-10-CM | POA: Diagnosis present

## 2015-04-06 DIAGNOSIS — L8990 Pressure ulcer of unspecified site, unspecified stage: Secondary | ICD-10-CM | POA: Insufficient documentation

## 2015-04-06 LAB — BASIC METABOLIC PANEL
Anion gap: 11 (ref 5–15)
Anion gap: 10 (ref 5–15)
BUN: 14 mg/dL (ref 6–20)
BUN: 12 mg/dL (ref 6–20)
CALCIUM: 8.1 mg/dL — AB (ref 8.9–10.3)
CHLORIDE: 103 mmol/L (ref 101–111)
CHLORIDE: 102 mmol/L (ref 101–111)
CO2: 22 mmol/L (ref 22–32)
CO2: 24 mmol/L (ref 22–32)
CREATININE: 0.48 mg/dL (ref 0.44–1.00)
Calcium: 7.9 mg/dL — ABNORMAL LOW (ref 8.9–10.3)
Creatinine, Ser: 0.39 mg/dL — ABNORMAL LOW (ref 0.44–1.00)
GFR calc Af Amer: >60 mL/min (ref >60)
GFR calc non Af Amer: >60 mL/min (ref >60)
GFR calc non Af Amer: >60 mL/min (ref >60)
GLUCOSE: 146 mg/dL — AB (ref 65–99)
Glucose, Bld: 211 mg/dL — ABNORMAL HIGH (ref 65–99)
POTASSIUM: 4.2 mmol/L (ref 3.5–5.1)
Potassium: 3.9 mmol/L (ref 3.5–5.1)
Sodium: 136 mmol/L (ref 135–145)
Sodium: 136 mmol/L (ref 135–145)

## 2015-04-06 LAB — CBC WITH DIFFERENTIAL/PLATELET
BASOS ABS: 0.4 10*3/uL — AB (ref 0.0–0.1)
Basophils Relative: 2 % — ABNORMAL HIGH (ref 0–1)
EOS PCT: 0 % (ref 0–5)
Eosinophils Absolute: 0 10*3/uL (ref 0.0–0.7)
HEMATOCRIT: 32.5 % — AB (ref 36.0–46.0)
Hemoglobin: 10.6 g/dL — ABNORMAL LOW (ref 12.0–15.0)
LYMPHS ABS: 2 10*3/uL (ref 0.7–4.0)
Lymphocytes Relative: 10 % — ABNORMAL LOW (ref 12–46)
MCH: 31 pg (ref 26.0–34.0)
MCHC: 32.6 g/dL (ref 30.0–36.0)
MCV: 95 fL (ref 78.0–100.0)
MONO ABS: 1.6 10*3/uL — AB (ref 0.1–1.0)
Monocytes Relative: 8 % (ref 3–12)
NEUTROS ABS: 15.7 10*3/uL — AB (ref 1.7–7.7)
Neutrophils Relative %: 80 % — ABNORMAL HIGH (ref 43–77)
Platelets: 422 10*3/uL — ABNORMAL HIGH (ref 150–400)
RBC: 3.42 MIL/uL — AB (ref 3.87–5.11)
RDW: 13.4 % (ref 11.5–15.5)
WBC: 19.7 10*3/uL — AB (ref 4.0–10.5)

## 2015-04-06 LAB — GLUCOSE, CAPILLARY
GLUCOSE-CAPILLARY: 162 mg/dL — AB (ref 65–99)
GLUCOSE-CAPILLARY: 140 mg/dL — AB (ref 65–99)
GLUCOSE-CAPILLARY: 155 mg/dL — AB (ref 65–99)
GLUCOSE-CAPILLARY: 143 mg/dL — AB (ref 65–99)
Glucose-Capillary: 148 mg/dL — ABNORMAL HIGH (ref 65–99)
Glucose-Capillary: 195 mg/dL — ABNORMAL HIGH (ref 65–99)
Glucose-Capillary: 197 mg/dL — ABNORMAL HIGH (ref 65–99)
Glucose-Capillary: 222 mg/dL — ABNORMAL HIGH (ref 65–99)
Glucose-Capillary: 237 mg/dL — ABNORMAL HIGH (ref 65–99)
Glucose-Capillary: 237 mg/dL — ABNORMAL HIGH (ref 65–99)
Glucose-Capillary: 238 mg/dL — ABNORMAL HIGH (ref 65–99)

## 2015-04-06 LAB — TROPONIN I: Troponin I: 0.04 ng/mL — ABNORMAL HIGH (ref <0.031)

## 2015-04-06 LAB — TSH: TSH: 1.157 u[IU]/mL (ref 0.350–4.500)

## 2015-04-06 LAB — MRSA PCR SCREENING: MRSA by PCR: NEGATIVE

## 2015-04-06 MED ORDER — INSULIN ASPART 100 UNIT/ML ~~LOC~~ SOLN
0.0000 [IU] | Freq: Every day | SUBCUTANEOUS | Status: DC
  Administered 2015-04-06: 2 [IU] via SUBCUTANEOUS
  Administered 2015-04-07: 3 [IU] via SUBCUTANEOUS

## 2015-04-06 MED ORDER — OXYCODONE HCL 5 MG PO TABS
5.0000 mg | ORAL_TABLET | Freq: Once | ORAL | Status: AC
  Administered 2015-04-06: 5 mg via ORAL
  Filled 2015-04-06: qty 1

## 2015-04-06 MED ORDER — FLUOXETINE HCL 20 MG PO CAPS
20.0000 mg | ORAL_CAPSULE | Freq: Every day | ORAL | Status: DC
  Administered 2015-04-06 – 2015-04-08 (×3): 20 mg via ORAL
  Filled 2015-04-06 (×3): qty 1

## 2015-04-06 MED ORDER — ONDANSETRON HCL 4 MG/2ML IJ SOLN
4.0000 mg | Freq: Four times a day (QID) | INTRAMUSCULAR | Status: DC | PRN
  Administered 2015-04-06 (×2): 4 mg via INTRAVENOUS
  Filled 2015-04-06 (×2): qty 2

## 2015-04-06 MED ORDER — SODIUM CHLORIDE 0.9 % IV SOLN
INTRAVENOUS | Status: DC
  Administered 2015-04-06 (×2): via INTRAVENOUS

## 2015-04-06 MED ORDER — GLUCERNA SHAKE PO LIQD
237.0000 mL | Freq: Three times a day (TID) | ORAL | Status: DC
  Administered 2015-04-06 – 2015-04-08 (×7): 237 mL via ORAL
  Filled 2015-04-06 (×9): qty 237

## 2015-04-06 MED ORDER — INSULIN DETEMIR 100 UNIT/ML ~~LOC~~ SOLN
36.0000 [IU] | Freq: Once | SUBCUTANEOUS | Status: AC
  Administered 2015-04-06: 36 [IU] via SUBCUTANEOUS
  Filled 2015-04-06: qty 0.36

## 2015-04-06 MED ORDER — INSULIN ASPART 100 UNIT/ML ~~LOC~~ SOLN
0.0000 [IU] | Freq: Three times a day (TID) | SUBCUTANEOUS | Status: DC
  Administered 2015-04-06: 3 [IU] via SUBCUTANEOUS
  Administered 2015-04-06: 2 [IU] via SUBCUTANEOUS
  Administered 2015-04-07: 3 [IU] via SUBCUTANEOUS
  Administered 2015-04-07: 2 [IU] via SUBCUTANEOUS
  Administered 2015-04-07: 5 [IU] via SUBCUTANEOUS
  Administered 2015-04-08: 2 [IU] via SUBCUTANEOUS
  Administered 2015-04-08: 5 [IU] via SUBCUTANEOUS

## 2015-04-06 MED ORDER — ATORVASTATIN CALCIUM 40 MG PO TABS
40.0000 mg | ORAL_TABLET | Freq: Every day | ORAL | Status: DC
  Administered 2015-04-07: 40 mg via ORAL
  Filled 2015-04-06 (×4): qty 1

## 2015-04-06 MED ORDER — NYSTATIN 100000 UNIT/GM EX POWD
Freq: Two times a day (BID) | CUTANEOUS | Status: DC
Start: 2015-04-06 — End: 2015-04-08
  Administered 2015-04-06 – 2015-04-08 (×4): via TOPICAL
  Filled 2015-04-06 (×2): qty 15

## 2015-04-06 MED ORDER — LORAZEPAM 2 MG/ML IJ SOLN
0.5000 mg | INTRAMUSCULAR | Status: AC | PRN
  Administered 2015-04-06 (×2): 0.5 mg via INTRAVENOUS
  Filled 2015-04-06: qty 1

## 2015-04-06 MED ORDER — BUSPIRONE HCL 15 MG PO TABS
7.5000 mg | ORAL_TABLET | Freq: Two times a day (BID) | ORAL | Status: DC
  Administered 2015-04-06 – 2015-04-08 (×5): 7.5 mg via ORAL
  Filled 2015-04-06 (×6): qty 1

## 2015-04-06 MED ORDER — ADULT MULTIVITAMIN W/MINERALS CH
1.0000 | ORAL_TABLET | Freq: Every day | ORAL | Status: DC
  Administered 2015-04-07 – 2015-04-08 (×2): 1 via ORAL
  Filled 2015-04-06 (×2): qty 1

## 2015-04-06 MED ORDER — SODIUM CHLORIDE 0.9 % IV SOLN: INTRAVENOUS | Status: DC

## 2015-04-06 MED ORDER — INSULIN DETEMIR 100 UNIT/ML ~~LOC~~ SOLN
36.0000 [IU] | Freq: Every day | SUBCUTANEOUS | Status: DC
  Administered 2015-04-07 – 2015-04-08 (×2): 36 [IU] via SUBCUTANEOUS
  Filled 2015-04-06 (×2): qty 0.36

## 2015-04-06 NOTE — Care Management Note (Signed)
Case Management Note  Patient Details  Name: Destiny Cole MRN: [id number] Date of Birth: 1950-09-02  Subjective/Objective:              dka and confusion      Action/Plan: Date:  April 06, 2015 U.R. performed for needs and level of care. Will continue to follow for Case Management needs.  Marcelle Smiling, RN, BSN, [place]   [phone]  Expected Discharge Date:                  Expected Discharge Plan:  Skilled Nursing Facility  In-House Referral:  Clinical Social Work  Discharge planning Services  CM Consult  Post Acute Care Choice:  NA Choice offered to:  NA  DME Arranged:    DME Agency:     HH Arranged:    HH Agency:     Status of Service:     Medicare Important Message Given:    Date Medicare IM Given:    Medicare IM give by:    Date Additional Medicare IM Given:    Additional Medicare Important Message give by:     If discussed at Long Length of Stay Meetings, dates discussed:    Additional Comments:  [name], [name], RN 04/06/2015, 9:19 AM

## 2015-04-06 NOTE — Progress Notes (Signed)
Initial Nutrition Assessment  DOCUMENTATION CODES:   Obesity unspecified  INTERVENTION:   Provide Glucerna Shake po TID, each supplement provides 220 kcal and 10 grams of protein Multivitamin with minerals daily Encourage PO intake RD to continue to monitor for educational needs  NUTRITION DIAGNOSIS:   Increased nutrient needs related to wound healing as evidenced by estimated needs.  GOAL:   Patient will meet greater than or equal to 90% of their needs  MONITOR:   PO intake, Supplement acceptance, Labs, Weight trends, Skin, I & O's  REASON FOR ASSESSMENT:   Malnutrition Screening Tool    ASSESSMENT:   64 y.o. female with history of diabetes mellitus type 2, cerebral palsy with right-sided weakness, hypertension, hyperlipidemia was brought to the ER after patient was found to have difficulty to get out of her commode, found to be in DKA and admitted to SDU.   Pt in room stating she feels hot despite fan in room and room temperature decreased. Pt states that her appeite decreases during the summer. Pt states that she used to weight 400 lb a year ago. When reviewing her weight history, I do not see a weight near 400 lb. Pt states she lost weight by taking raspberry ketone supplements which she states lowered her appetite. Pt reports trying to lose weight.  Pt reports eating breakfast and states she thinks her blood sugar is running high d/t what she had. Pt states she drank juice with breakfast. Pt would benefit from nutrition education once out of the ICU.  Pt willing to try Glucerna shakes to provide extra protein for her multiple wounds.   Nutrition focused physical exam shows no sign of depletion of muscle mass or body fat.   Labs reviewed: CBGs: 140-155 Low Creatinine  Diet Order:  Diet Carb Modified Fluid consistency:: Thin; Room service appropriate?: Yes  Skin:   Stage II coccyx, elbow, and buttocks ulcers  Last BM:  8/28  Height:   Ht Readings from Last 1  Encounters:  04/05/15  (1.549 m)    Weight:   Wt Readings from Last 1 Encounters:  04/05/15 201 lb 4.5 oz (91.3 kg)    Ideal Body Weight:  47.7 kg  BMI:  Body mass index is 38.05 kg/(m^2).  Estimated Nutritional Needs:   Kcal:  1450-1650  Protein:  60-70g  Fluid:  1.5L/day  EDUCATION NEEDS:   Education needs no appropriate at this time  Tilda Franco, MS, RD, LDN Pager: [phone] After Hours Pager: [phone]

## 2015-04-06 NOTE — Consult Note (Signed)
[facility] Consult   Reason for Consult:  Depression and anxiety Referring Physician:  Dr. Cruzita Lederer Patient Identification: [name] MRN:  [id number] Principal Diagnosis: Anxiety state Diagnosis:   Patient Active Problem List   Diagnosis Date Noted  . Pressure ulcer [L89.90] 04/06/2015  . Facial droop [R29.810] 04/06/2015  . Leukocytosis [D72.829] 04/06/2015  . Anxiety state [F41.1] 04/06/2015  . Failure to thrive in adult [R62.7] 04/05/2015  . DKA, type 2 [E13.10] 04/05/2015  . Hyperlipidemia [E78.5] 04/05/2015  . Diabetic ketoacidosis without coma associated with diabetes mellitus due to underlying condition [E08.10]   . Physical deconditioning [R53.81]   . Acute respiratory depression with hypoxia - Resolved [J96.01] 10/30/2012  . Nausea, vomiting and fever - likely secondary to viral gastroenteritis [R11.10] 10/28/2012  . Diabetes mellitus without complication [O84.1]   . Cerebral palsy [G80.9]   . Hypertension [I10]     Total Time spent with patient: 1 hour  Subjective:   Destiny Cole is a 64 y.o. female patient admitted with generalized weakness.  HPI:  Destiny Cole is a 64 y.o. female seen face-to-face for psychiatric consultation and evaluation of depression anxiety. Patient reported she has been living by herself and has a bird as a Loss adjuster, chartered. Patient reported that she has been suffering with diabetes, hypertension, depression and anxiety. Patient reportedly become weak, tired, unable to care for herself and then came to the hospital found herself with the blood sugar and 500 range. Patient reported she has been happy to know that she has been receiving appropriate medication treatment while in the hospital. Patient denied symptoms of depression even though she endorses generalized anxiety during this evaluation. Patient denies suicidal ideation reported that she has 3 children and 4 grandchildren. She wanted to live for her grandchildren. Patient  denies current suicidal/homicidal ideation, intention or plans. Patient also reported she has been compliant with her medication management. Patient.social service regarding transportation issues with her medical offices. Patient is known to have cerebral palsy with right-sided weakness. Patient uses a motor wheelchair or walker. Patient has no previous acute psychiatric hospitalization or outpatient medication management.  HPI Elements:   Location:  Depression and anxiety. Quality:  Fair. Severity:  Generalized weakness. Timing:  Uncontrollable diabetes. Duration:  Per the month. Context:  Unknown psychosocial stressors, chronic disability for psychosocial support.  Past Medical History:  Past Medical History  Diagnosis Date  . Diabetes mellitus without complication   . Cerebral palsy   . Hypertension     Past Surgical History  Procedure Laterality Date  . Abdominal hysterectomy    . Mouth surgery    . Cerbral palsy     Family History:  Family History  Problem Relation Age of Onset  . Breast cancer Mother   . Hypertension Mother   . Diabetes Mellitus II Maternal Aunt    Social History:  History  Alcohol Use No     History  Drug Use No    Social History   Social History  . Marital Status: Legally Separated    Spouse Name: N/A  . Number of Children: N/A  . Years of Education: N/A   Social History Main Topics  . Smoking status: Never Smoker   . Smokeless tobacco: None  . Alcohol Use: No  . Drug Use: No  . Sexual Activity: Not Asked   Other Topics Concern  . None   Social History Narrative   Additional Social History:  Allergies:   Allergies  Allergen Reactions  . Promethazine     She has a possible respiratory reaction with this medication with RR 4 and hypoxia a few minutes after administration. Other respiratory depressants like benzos should be used with caution.  . Sulfa Antibiotics Swelling and Rash    Labs:   Results for orders placed or performed during the hospital encounter of 04/05/15 (from the past 48 hour(s))  Urinalysis, Routine w reflex microscopic (not at [facility])     Status: Abnormal   Collection Time: 04/05/15  5:12 PM  Result Value Ref Range   Color, Urine YELLOW YELLOW   APPearance CLOUDY (A) CLEAR   Specific Gravity, Urine 1.029 1.005 - 1.030   pH 5.0 5.0 - 8.0   Glucose, UA >1000 (A) NEGATIVE mg/dL   Hgb urine dipstick TRACE (A) NEGATIVE   Bilirubin Urine NEGATIVE NEGATIVE   Ketones, ur >80 (A) NEGATIVE mg/dL   Protein, ur NEGATIVE NEGATIVE mg/dL   Urobilinogen, UA 1.0 0.0 - 1.0 mg/dL   Nitrite NEGATIVE NEGATIVE   Leukocytes, UA NEGATIVE NEGATIVE  Urine microscopic-add on     Status: None   Collection Time: 04/05/15  5:12 PM  Result Value Ref Range   Squamous Epithelial / LPF RARE RARE   WBC, UA 3-6 <3 WBC/hpf   Bacteria, UA RARE RARE   Urine-Other AMORPHOUS URATES/PHOSPHATES   CBC     Status: Abnormal   Collection Time: 04/05/15  5:17 PM  Result Value Ref Range   WBC 19.6 (H) 4.0 - 10.5 K/uL   RBC 3.69 (L) 3.87 - 5.11 MIL/uL   Hemoglobin 11.5 (L) 12.0 - 15.0 g/dL   HCT 35.8 (L) 36.0 - 46.0 %   MCV 97.0 78.0 - 100.0 fL   MCH 31.2 26.0 - 34.0 pg   MCHC 32.1 30.0 - 36.0 g/dL   RDW 13.5 11.5 - 15.5 %   Platelets 447 (H) 150 - 400 K/uL  Comprehensive metabolic panel     Status: Abnormal   Collection Time: 04/05/15  5:17 PM  Result Value Ref Range   Sodium 130 (L) 135 - 145 mmol/L   Potassium 4.4 3.5 - 5.1 mmol/L   Chloride 94 (L) 101 - 111 mmol/L   CO2 17 (L) 22 - 32 mmol/L   Glucose, Bld 536 (H) 65 - 99 mg/dL   BUN 17 6 - 20 mg/dL   Creatinine, Ser 0.80 0.44 - 1.00 mg/dL   Calcium 8.5 (L) 8.9 - 10.3 mg/dL   Total Protein 7.2 6.5 - 8.1 g/dL   Albumin 2.1 (L) 3.5 - 5.0 g/dL   AST 14 (L) 15 - 41 U/L   ALT 11 (L) 14 - 54 U/L   Alkaline Phosphatase 136 (H) 38 - 126 U/L   Total Bilirubin 1.5 (H) 0.3 - 1.2 mg/dL   GFR calc non Af Amer >60 >60 mL/min   GFR calc Af  Amer >60 >60 mL/min    Comment: (NOTE) The eGFR has been calculated using the CKD EPI equation. This calculation has not been validated in all clinical situations. eGFR's persistently <60 mL/min signify possible Chronic Kidney Disease.    Anion gap 19 (H) 5 - 15  Glucose, capillary     Status: Abnormal   Collection Time: 04/05/15 10:10 PM  Result Value Ref Range   Glucose-Capillary 291 (H) 65 - 99 mg/dL  CK     Status: Abnormal   Collection Time: 04/05/15 10:35 PM  Result Value Ref Range  Total CK 35 (L) 38 - 234 U/L  Troponin I (q 6hr x 3)     Status: None   Collection Time: 04/05/15 10:35 PM  Result Value Ref Range   Troponin I <0.03 <0.031 ng/mL    Comment:        NO INDICATION OF MYOCARDIAL INJURY.   TSH     Status: None   Collection Time: 04/05/15 10:35 PM  Result Value Ref Range   TSH 1.157 0.350 - 4.500 uIU/mL  Basic metabolic panel (stat then every 4 hours)     Status: Abnormal   Collection Time: 04/05/15 10:35 PM  Result Value Ref Range   Sodium 131 (L) 135 - 145 mmol/L   Potassium 4.1 3.5 - 5.1 mmol/L   Chloride 102 101 - 111 mmol/L   CO2 16 (L) 22 - 32 mmol/L   Glucose, Bld 291 (H) 65 - 99 mg/dL   BUN 15 6 - 20 mg/dL   Creatinine, Ser 0.66 0.44 - 1.00 mg/dL   Calcium 7.8 (L) 8.9 - 10.3 mg/dL   GFR calc non Af Amer >60 >60 mL/min   GFR calc Af Amer >60 >60 mL/min    Comment: (NOTE) The eGFR has been calculated using the CKD EPI equation. This calculation has not been validated in all clinical situations. eGFR's persistently <60 mL/min signify possible Chronic Kidney Disease.    Anion gap 13 5 - 15  CBC     Status: Abnormal   Collection Time: 04/05/15 10:35 PM  Result Value Ref Range   WBC 18.5 (H) 4.0 - 10.5 K/uL   RBC 3.33 (L) 3.87 - 5.11 MIL/uL   Hemoglobin 10.1 (L) 12.0 - 15.0 g/dL   HCT 31.8 (L) 36.0 - 46.0 %   MCV 95.5 78.0 - 100.0 fL   MCH 30.3 26.0 - 34.0 pg   MCHC 31.8 30.0 - 36.0 g/dL   RDW 13.3 11.5 - 15.5 %   Platelets 456 (H) 150 - 400  K/uL  MRSA PCR Screening     Status: None   Collection Time: 04/05/15 10:59 PM  Result Value Ref Range   MRSA by PCR NEGATIVE NEGATIVE    Comment:        The GeneXpert MRSA Assay (FDA approved for NASAL specimens only), is one component of a comprehensive MRSA colonization surveillance program. It is not intended to diagnose MRSA infection nor to guide or monitor treatment for MRSA infections.   Glucose, capillary     Status: Abnormal   Collection Time: 04/05/15 11:16 PM  Result Value Ref Range   Glucose-Capillary 308 (H) 65 - 99 mg/dL  Glucose, capillary     Status: Abnormal   Collection Time: 04/06/15 12:18 AM  Result Value Ref Range   Glucose-Capillary 238 (H) 65 - 99 mg/dL  Glucose, capillary     Status: Abnormal   Collection Time: 04/06/15  1:21 AM  Result Value Ref Range   Glucose-Capillary 222 (H) 65 - 99 mg/dL  Glucose, capillary     Status: Abnormal   Collection Time: 04/06/15  2:22 AM  Result Value Ref Range   Glucose-Capillary 197 (H) 65 - 99 mg/dL  Troponin I (q 6hr x 3)     Status: None   Collection Time: 04/06/15  2:37 AM  Result Value Ref Range   Troponin I <0.03 <0.031 ng/mL    Comment:        NO INDICATION OF MYOCARDIAL INJURY.   Basic metabolic panel (stat then every 4  hours)     Status: Abnormal   Collection Time: 04/06/15  2:37 AM  Result Value Ref Range   Sodium 136 135 - 145 mmol/L   Potassium 3.9 3.5 - 5.1 mmol/L   Chloride 103 101 - 111 mmol/L   CO2 22 22 - 32 mmol/L   Glucose, Bld 211 (H) 65 - 99 mg/dL   BUN 14 6 - 20 mg/dL   Creatinine, Ser 0.48 0.44 - 1.00 mg/dL   Calcium 8.1 (L) 8.9 - 10.3 mg/dL   GFR calc non Af Amer >60 >60 mL/min   GFR calc Af Amer >60 >60 mL/min    Comment: (NOTE) The eGFR has been calculated using the CKD EPI equation. This calculation has not been validated in all clinical situations. eGFR's persistently <60 mL/min signify possible Chronic Kidney Disease.    Anion gap 11 5 - 15  Glucose, capillary      Status: Abnormal   Collection Time: 04/06/15  3:25 AM  Result Value Ref Range   Glucose-Capillary 162 (H) 65 - 99 mg/dL  Glucose, capillary     Status: Abnormal   Collection Time: 04/06/15  4:26 AM  Result Value Ref Range   Glucose-Capillary 148 (H) 65 - 99 mg/dL  Glucose, capillary     Status: Abnormal   Collection Time: 04/06/15  5:22 AM  Result Value Ref Range   Glucose-Capillary 140 (H) 65 - 99 mg/dL  Glucose, capillary     Status: Abnormal   Collection Time: 04/06/15  6:29 AM  Result Value Ref Range   Glucose-Capillary 155 (H) 65 - 99 mg/dL  Troponin I (q 6hr x 3)     Status: Abnormal   Collection Time: 04/06/15  7:30 AM  Result Value Ref Range   Troponin I 0.04 (H) <0.031 ng/mL    Comment:        PERSISTENTLY INCREASED TROPONIN VALUES IN THE RANGE OF 0.04-0.49 ng/mL CAN BE SEEN IN:       -UNSTABLE ANGINA       -CONGESTIVE HEART FAILURE       -MYOCARDITIS       -CHEST TRAUMA       -ARRYHTHMIAS       -LATE PRESENTING MYOCARDIAL INFARCTION       -COPD   CLINICAL FOLLOW-UP RECOMMENDED.   CBC with Differential/Platelet     Status: Abnormal   Collection Time: 04/06/15  7:30 AM  Result Value Ref Range   WBC 19.7 (H) 4.0 - 10.5 K/uL    Comment: WHITE COUNT CONFIRMED ON SMEAR   RBC 3.42 (L) 3.87 - 5.11 MIL/uL   Hemoglobin 10.6 (L) 12.0 - 15.0 g/dL   HCT 32.5 (L) 36.0 - 46.0 %   MCV 95.0 78.0 - 100.0 fL   MCH 31.0 26.0 - 34.0 pg   MCHC 32.6 30.0 - 36.0 g/dL   RDW 13.4 11.5 - 15.5 %   Platelets 422 (H) 150 - 400 K/uL   Neutrophils Relative % 80 (H) 43 - 77 %   Lymphocytes Relative 10 (L) 12 - 46 %   Monocytes Relative 8 3 - 12 %   Eosinophils Relative 0 0 - 5 %   Basophils Relative 2 (H) 0 - 1 %   Neutro Abs 15.7 (H) 1.7 - 7.7 K/uL   Lymphs Abs 2.0 0.7 - 4.0 K/uL   Monocytes Absolute 1.6 (H) 0.1 - 1.0 K/uL   Eosinophils Absolute 0.0 0.0 - 0.7 K/uL   Basophils Absolute 0.4 (H) 0.0 -  0.1 K/uL   RBC Morphology POLYCHROMASIA PRESENT    WBC Morphology TOXIC GRANULATION    Basic metabolic panel (stat then every 4 hours)     Status: Abnormal   Collection Time: 04/06/15  7:30 AM  Result Value Ref Range   Sodium 136 135 - 145 mmol/L   Potassium 4.2 3.5 - 5.1 mmol/L   Chloride 102 101 - 111 mmol/L   CO2 24 22 - 32 mmol/L   Glucose, Bld 146 (H) 65 - 99 mg/dL   BUN 12 6 - 20 mg/dL   Creatinine, Ser 0.39 (L) 0.44 - 1.00 mg/dL   Calcium 7.9 (L) 8.9 - 10.3 mg/dL   GFR calc non Af Amer >60 >60 mL/min   GFR calc Af Amer >60 >60 mL/min    Comment: (NOTE) The eGFR has been calculated using the CKD EPI equation. This calculation has not been validated in all clinical situations. eGFR's persistently <60 mL/min signify possible Chronic Kidney Disease.    Anion gap 10 5 - 15  Glucose, capillary     Status: Abnormal   Collection Time: 04/06/15  7:45 AM  Result Value Ref Range   Glucose-Capillary 143 (H) 65 - 99 mg/dL   Comment 1 Notify RN    Comment 2 Document in Chart   Glucose, capillary     Status: Abnormal   Collection Time: 04/06/15 11:52 AM  Result Value Ref Range   Glucose-Capillary 237 (H) 65 - 99 mg/dL    Vitals: Blood pressure 144/67, pulse 97, temperature 98.6 F (37 C), temperature source Oral, resp. rate 24, height 5' 1"  (1.549 m), weight 91.3 kg (201 lb 4.5 oz), SpO2 97 %.  Risk to Self: Is patient at risk for suicide?: No Risk to Others:   Prior Inpatient Therapy:   Prior Outpatient Therapy:    Current Facility-Administered Medications  Medication Dose Route Frequency Provider Last Rate Last Dose  . 0.9 %  sodium chloride infusion   Intravenous Continuous Caren Griffins, MD 75 mL/hr at 04/06/15 1104    . aspirin chewable tablet 81 mg  81 mg Oral Daily Rise Patience, MD   81 mg at 04/06/15 0916  . atorvastatin (LIPITOR) tablet 40 mg  40 mg Oral q1800 Caren Griffins, MD      . enoxaparin (LOVENOX) injection 40 mg  40 mg Subcutaneous Q24H Rise Patience, MD   40 mg at 04/06/15 0024  . feeding supplement (GLUCERNA SHAKE)  (GLUCERNA SHAKE) liquid 237 mL  237 mL Oral TID BM Clayton Bibles, RD   237 mL at 04/06/15 1214  . hydrALAZINE (APRESOLINE) injection 10 mg  10 mg Intravenous Q4H PRN Rise Patience, MD      . insulin aspart (novoLOG) injection 0-5 Units  0-5 Units Subcutaneous QHS Costin Karlyne Greenspan, MD      . insulin aspart (novoLOG) injection 0-9 Units  0-9 Units Subcutaneous TID WC Costin Karlyne Greenspan, MD   3 Units at 04/06/15 1214  . [START ON 04/07/2015] insulin detemir (LEVEMIR) injection 36 Units  36 Units Subcutaneous Daily Rise Patience, MD      . multivitamin with minerals tablet 1 tablet  1 tablet Oral Daily Clayton Bibles, RD      . nystatin (MYCOSTATIN/NYSTOP) topical powder   Topical BID Caren Griffins, MD      . ondansetron [facility]) injection 4 mg  4 mg Intravenous Q6H PRN Rise Patience, MD   4 mg at 04/06/15 [phone]  Musculoskeletal: Strength & Muscle Tone: decreased Gait & Station: unable to stand Patient leans: N/A  Psychiatric Specialty Exam: Physical Exam as per history and physical  ROS generalized weakness and anxiety. Patient denied nausea, vomiting, diarrhea, abdominal pain, shortness of breath and chest pain.   Blood pressure 144/67, pulse 97, temperature 98.6 F (37 C), temperature source Oral, resp. rate 24, height 5' 1"  (1.549 m), weight 91.3 kg (201 lb 4.5 oz), SpO2 97 %.Body mass index is 38.05 kg/(m^2).  General Appearance: Fairly Groomed  Engineer, water::  Good  Speech:  Clear and Coherent  Volume:  Normal  Mood:  Anxious  Affect:  Appropriate and Congruent  Thought Process:  Coherent and Goal Directed  Orientation:  Full (Time, Place, and Person)  Thought Content:  WDL  Suicidal Thoughts:  No  Homicidal Thoughts:  No  Memory:  Immediate;   Good Recent;   Good Remote;   Good  Judgement:  Intact  Insight:  Fair  Psychomotor Activity:  Decreased  Concentration:  Good  Recall:  Good  Fund of Knowledge:Good  Language: Good  Akathisia:  Negative  Handed:   Right  AIMS (if indicated):     Assets:  Communication Skills Desire for Improvement Financial Resources/Insurance Housing Leisure Time Resilience Social Support Transportation  ADL's:  Impaired  Cognition: WNL  Sleep:      Medical Decision Making: New problem, with additional work up planned, Review of Psycho-Social Stressors (1), Review or order clinical lab tests (1), Review of Last Therapy Session (1), Review or order medicine tests (1), Review of Medication Regimen & Side Effects (2) and Review of New Medication or Change in Dosage (2)  Treatment Plan Summary: Daily contact with patient to assess and evaluate symptoms and progress in treatment and Medication management  Plan:  Will reintroduce her fluoxetine 20 mg PO Qam for depression Start Buspirone 7.5 mg P BID for anxiety  Patient does not meet criteria for psychiatric inpatient admission. Supportive therapy provided about ongoing stressors.  Appreciate psychiatric consultation and will sign off at this time Please contact [phone] or [phone] if needs further assistance   Disposition: Discharge to home when medically stable and out patient medication management.   Raychell Holcomb,JANARDHAHA R. 04/06/2015 12:40 PM

## 2015-04-06 NOTE — Progress Notes (Signed)
Pt has very bad yeast under her pannus worse on right than left.

## 2015-04-06 NOTE — Progress Notes (Signed)
PROGRESS NOTE  Destiny Cole:[id number] DOB: 05/16/1951 DOA: 04/05/2015 PCP: Egbert Garibaldi, NP   HPI: Destiny Cole is a 64 y.o. female with history of diabetes mellitus type 2, cerebral palsy with right-sided weakness, hypertension, hyperlipidemia was brought to the ER after patient was found to have difficulty to get out of her commode, found to be in DKA and admitted to SDU.   Subjective / 24 H Interval events - complains of back pain this morning - denies chest pain, dyspnea  Assessment/Plan: Principal Problem:   DKA, type 2 Active Problems:   Cerebral palsy   Hypertension   Hyperlipidemia   Facial droop   Leukocytosis   Anxiety state   DKA - patient was maintained on an insulin gtt overnight, discontinued this morning and she is now on Lantus + SSI. Her gap is closed, advance her diet - obtain A1C  Weakness  - discussed with patient, over the past few weeks (Since end of July per patient) she has been feeling very weak and overall very deconditioned.  - I discussed with her daughter Amil Amen over the phone (she is an Charity fundraiser). She is very concerned and feels like her mother has been acting differently lately. She states that sometimes when she is watching a movie on TV she is under the impression that whatever happens in the movie has happened to her, she also is thinks that her mother has hallucinations. I have consulted psychiatry, appreciate input  - Her daughter also noticed in the past 1-2 weeks that her mother may have a slight facial droop.  - obtain MRI to r/o CVA. If positive consult Neuro, complete workup - already on aspirin - PT to evaluate  DM - Lantus + SSI as above, A1C pending  Leukocytosis - no apparent source of infection, UA clean, CXR without acute findings - has had mild elevated bilirubin, US abdomen pending - she is afebrile  HLD - resume Lipitor  HTN - hold Prinizide for now  Anxiety / agoraphobia - psychiatry to see as well      Diet: Diet Carb Modified Fluid consistency:: Thin; Room service appropriate?: Yes Fluids: NS DVT Prophylaxis: Lovenox  Code Status: Partial Code Family Communication: d/w daughter over the phone  Disposition Plan: transfer to telemetry   Consultants:  Psychiatry  Procedures:  None    Antibiotics  Anti-infectives    None       Studies  Dg Chest 2 View  04/05/2015   CLINICAL DATA:  Weakness and hypertension.  Diabetic.  EXAM: CHEST  2 VIEW  COMPARISON:  04/01/2015 and 10/30/2014  FINDINGS: Patient is rotated to the right. Lungs are somewhat hypoinflated without definite consolidation or effusion. Cardiomediastinal silhouette and remainder of the exam is unchanged.  IMPRESSION: No active cardiopulmonary disease.   Electronically Signed   By: Elberta Fortis M.D.   On: 04/05/2015 18:55   Ct Head Wo Contrast  04/05/2015   CLINICAL DATA:  Progressive weakness.  Fell.  EXAM: CT HEAD WITHOUT CONTRAST  TECHNIQUE: Contiguous axial images were obtained from the base of the skull through the vertex without intravenous contrast.  COMPARISON:  10/28/2012, 12/12/2008.  FINDINGS: There is no intracranial hemorrhage or extra-axial fluid collection. There is chronic encephalomalacia in the left centrum semiovale. There is mild generalized atrophy. The brain is otherwise unremarkable and unchanged. Calvarium and skullbase are intact. There is no bony abnormality.  IMPRESSION: Chronic encephalomalacia in the left centrum semiovale without significant interval change. Mild generalized atrophy.  No acute findings.   Electronically Signed   By: Ellery Plunk M.D.   On: 04/05/2015 21:26   Dg Pelvis Portable  04/05/2015   CLINICAL DATA:  Chronic sacrococcygeal pain, with pressure sores. Recent bilateral lower extremity weakness.  EXAM: PORTABLE PELVIS 1-2 VIEWS  COMPARISON:  04/01/2015  FINDINGS: Negative for fracture or dislocation about either hip. The pubic symphysis and sacroiliac joints appear  grossly intact. There is no bone lesion or bony destruction.  IMPRESSION: Negative for acute fracture.   Electronically Signed   By: Ellery Plunk M.D.   On: 04/05/2015 21:37    Objective  Filed Vitals:   04/06/15 0023 04/06/15 0400 04/06/15 0600 04/06/15 0800  BP: 147/57 118/76 144/67   Pulse: 101 100 97   Temp: 98.8 F (37.1 C) 98.7 F (37.1 C)  98.6 F (37 C)  TempSrc:  Oral  Oral  Resp: 21 26 24    Height:      Weight:      SpO2: 97% 94% 97%     Intake/Output Summary (Last 24 hours) at 04/06/15 1011 Last data filed at 04/06/15 0431  Gross per 24 hour  Intake   1370 ml  Output    550 ml  Net    820 ml   Filed Weights   04/05/15 1627 04/05/15 2203  Weight: 93.441 kg (206 lb) 91.3 kg (201 lb 4.5 oz)    Exam:  GENERAL: NAD  HEENT: head NCAT, no scleral icterus. Pupils round and reactive.   NECK: Supple.   LUNGS: Clear to auscultation. No wheezing or crackles  HEART: Regular rate and rhythm without murmur. 2+ pulses, no JVD, no peripheral edema  ABDOMEN: Soft, nontender, and nondistended. Positive bowel sounds.   EXTREMITIES: Without any cyanosis, clubbing, rash, lesions or edema.  NEUROLOGIC: Alert and oriented x3. Strength equal  PSYCHIATRIC: flat mood and affect  SKIN: No ulceration or induration present. Rash under pannus noticed, worse on right. Stage 2 sacral ulcer without drainage or surrounding cellulitis  Data Reviewed: Basic Metabolic Panel:  Recent Labs Lab 04/01/15 1955 04/05/15 1717 04/05/15 2235 04/06/15 0237 04/06/15 0730  NA 127* 130* 131* 136 136  K 4.6 4.4 4.1 3.9 4.2  CL 84* 94* 102 103 102  CO2 22 17* 16* 22 24  GLUCOSE 547* 536* 291* 211* 146*  BUN 18 17 15 14 12   CREATININE 1.07* 0.80 0.66 0.48 0.39*  CALCIUM 8.8* 8.5* 7.8* 8.1* 7.9*   Liver Function Tests:  Recent Labs Lab 04/01/15 1955 04/05/15 1717  AST 24 14*  ALT 11* 11*  ALKPHOS 129* 136*  BILITOT 2.6* 1.5*  PROT 7.8 7.2  ALBUMIN 2.0* 2.1*    CBC:  Recent Labs Lab 04/01/15 1955 04/05/15 1717 04/05/15 2235 04/06/15 0730  WBC 17.3* 19.6* 18.5* 19.7*  NEUTROABS 14.5*  --   --  15.7*  HGB 11.8* 11.5* 10.1* 10.6*  HCT 35.5* 35.8* 31.8* 32.5*  MCV 94.2 97.0 95.5 95.0  PLT 502* 447* 456* 422*   Cardiac Enzymes:  Recent Labs Lab 04/05/15 2235 04/06/15 0237 04/06/15 0730  CKTOTAL 35*  --   --   TROPONINI <0.03 <0.03 0.04*   CBG:  Recent Labs Lab 04/06/15 0325 04/06/15 0426 04/06/15 0522 04/06/15 0629 04/06/15 0745  GLUCAP 162* 148* 140* 155* 143*    Recent Results (from the past 240 hour(s))  MRSA PCR Screening     Status: None   Collection Time: 04/05/15 10:59 PM  Result Value Ref Range Status  MRSA by PCR NEGATIVE NEGATIVE Final    Comment:        The GeneXpert MRSA Assay (FDA approved for NASAL specimens only), is one component of a comprehensive MRSA colonization surveillance program. It is not intended to diagnose MRSA infection nor to guide or monitor treatment for MRSA infections.      Scheduled Meds: . aspirin  81 mg Oral Daily  . enoxaparin (LOVENOX) injection  40 mg Subcutaneous Q24H  . insulin aspart  0-5 Units Subcutaneous QHS  . insulin aspart  0-9 Units Subcutaneous TID WC  . [START ON 04/07/2015] insulin detemir  36 Units Subcutaneous Daily   Continuous Infusions: . sodium chloride 100 mL/hr at 04/06/15 8295     Pamella Pert, MD [facility] Pager [phone]. If 7 PM - 7 AM, please contact night-coverage at [email or web address], password [facility] [address] 04/06/2015, 10:11 AM  LOS: 1 day

## 2015-04-06 NOTE — Consult Note (Signed)
WOC wound consult note Reason for Consult:Intertriginous dermatitis in the sub-pannicular skin folds (erythema with no open areas), also a full thickness area of skin breakdown in the intragluteal cleft (moisture, not pressure-related) Wound type:Moisture associated skin damage (MASD), specifically intertriginous dermatitis (ITD).  Full thickness tissue loss in the intragluteal cleft. Pressure Ulcer POA: No Measurement:4cm x 0.4cm x 0.2cm Wound bed: 75% pink, 25% yellow slough Drainage (amount, consistency, odor) small amount of light yellow, drainage with slightly musty odor Periwound:Intact, dry Dressing procedure/placement/frequency: Nursing staff is provided guidance in turning and repositioning (Avoid supine positioning) and float or elevate heels. Additionally, I have provided orders for the provision of our house antimicrobial textile ([employer]Dry [employer]+) for management of her intertriginous dermatitis. Two wound treatment associates (WTAs), D. Dark and J. Burns are assistants in my assessment and in the development of the POC.  Patients in in agreement with the POC. WOC nursing team will not follow, but will remain available to this patient, the nursing and medical team.  Please re-consult if needed. Thanks, Ladona Mow, MSN, RN, GNP, [place], CWON-AP [phone])

## 2015-04-06 NOTE — Progress Notes (Signed)
Full report given to 4E RN Annabelle Harman. Pt stable for transport.

## 2015-04-06 NOTE — Evaluation (Signed)
Physical Therapy Evaluation Patient Details Name: Destiny Cole MRN: [id number] DOB: 1950-11-10 Today's Date: 04/06/2015   History of Present Illness  Destiny Cole is a 64 y.o. female with history of diabetes mellitus type 2, cerebral palsy with right-sided weakness, hypertension, hyperlipidemia was brought to the ER after patient was found to have difficulty to get out of her commode.  Clinical Impression  Pt admitted with above diagnosis. Pt currently with functional limitations due to the deficits listed below (see PT Problem List).  Pt will benefit from skilled PT to increase their independence and safety with mobility to allow discharge to the venue listed below.   PT eval limited today as pt declines OOB again (was up with nursing staff  Earlier) d/t fatigue and pain; she is quite deconditioned and has been having increasing difficulty with amb/xfers/ADLs  At home for the past few months; Recommend SNF post acute, will follow     Follow Up Recommendations SNF;Supervision/Assistance - 24 hour    Equipment Recommendations  None recommended by PT    Recommendations for Other Services       Precautions / Restrictions Precautions Precautions: Fall      Mobility  Bed Mobility Overal bed mobility: Needs Assistance Bed Mobility: Rolling Rolling: Mod assist;Max assist         General bed mobility comments: cues for self assist  Transfers                 General transfer comment: NT - pt declined stating she was OOB earlier and "just got comfortable"  Ambulation/Gait             General Gait Details: NT- pt declined  Stairs            Wheelchair Mobility    Modified Rankin (Stroke Patients Only)       Balance Overall balance assessment:  (unsteady per nursing staff)                                           Pertinent Vitals/Pain Pain Assessment: 0-10 Pain Score: 3  Pain Location: back Pain Descriptors / Indicators:  Constant Pain Intervention(s): Monitored during session    Home Living Family/patient expects to be discharged to:: Skilled nursing facility Living Arrangements: Alone   Type of Home: Apartment       Home Layout: One level Home Equipment: Environmental consultant - 4 wheels Additional Comments: pt resides at [facility]    Prior Function Level of Independence: Needs assistance   Gait / Transfers Assistance Needed: amb with RW  ADL's / Homemaking Assistance Needed: sponge bathing  Comments: dtrs have been assisting with laundry for one month d/t construction at her apt     Hand Dominance        Extremity/Trunk Assessment   Upper Extremity Assessment: LUE deficits/detail RUE Deficits / Details: baseline deficits d/t  CP, diffuse muscle atrophy     LUE Deficits / Details: groossly 2+/5   Lower Extremity Assessment: RLE deficits/detail;LLE deficits/detail RLE Deficits / Details: grossly 2 to 2+/5; pt c/o pain with  AA-knee flexion; ankle df 0/5 and pt reports this is her baseline LLE Deficits / Details: 2+ to 3/5; pt puts forth little effort with attempts toward testing; AAROM grossly WFL althoug body habitus (pannus) limiting hip flexion     Communication   Communication: No difficulties  Cognition Arousal/Alertness: Awake/alert Behavior  During Therapy: WFL for tasks assessed/performed Overall Cognitive Status: Within Functional Limits for tasks assessed                      General Comments      Exercises        Assessment/Plan    PT Assessment Patient needs continued PT services  PT Diagnosis Generalized weakness   PT Problem List Decreased strength;Decreased activity tolerance;Decreased mobility;Decreased range of motion  PT Treatment Interventions DME instruction;Gait training;Functional mobility training;Therapeutic activities;Patient/family education;Therapeutic exercise   PT Goals (Current goals can be found in the Care Plan section) Acute Rehab PT  Goals Patient Stated Goal: to decr pain and get stronger PT Goal Formulation: With patient Time For Goal Achievement: 04/20/15 Potential to Achieve Goals: Fair    Frequency Min 3X/week   Barriers to discharge        Co-evaluation               End of Session   Activity Tolerance: No increased pain;Patient limited by fatigue Patient left: in bed (bedrails x4 per pt request)           Time: 1420-1440 PT Time Calculation (min) (ACUTE ONLY): 20 min   Charges:   PT Evaluation $Initial PT Evaluation Tier I: 1 Procedure     PT G CodesDrucilla Chalet April 14, 2015, 2:46 PM

## 2015-04-06 NOTE — Clinical Social Work Psych Assess (Signed)
Clinical Social Work Nature conservation officer  Clinical Social Worker:  Boone Master, [place] Date/Time:  04/06/2015, 2:15 PM Referred By:  Physician Date Referred:  04/06/15 Reason for Referral:  Behavioral Health Issues   Presenting Symptoms/Problems  Presenting Symptoms/Problems(in person's/family's own words):  Psych consulted due to anxiety issues.   Abuse/Neglect/Trauma History  Abuse/Neglect/Trauma History:  Denies History Abuse/Neglect/Trauma History Comments (indicate dates):  N/A   Psychiatric History  Psychiatric History:  Denies History Psychiatric Medication:  Prozac and Buspar   Current Mental Health Hospitalizations/Previous Mental Health History:  Patient reports a history of depression and anxiety.   Current Provider:  PCP Place and Date:  [place], [place]  Current Medications:   Scheduled Meds: . aspirin  81 mg Oral Daily  . atorvastatin  40 mg Oral q1800  . busPIRone  7.5 mg Oral BID  . enoxaparin (LOVENOX) injection  40 mg Subcutaneous Q24H  . feeding supplement (GLUCERNA SHAKE)  237 mL Oral TID BM  . FLUoxetine  20 mg Oral Daily  . insulin aspart  0-5 Units Subcutaneous QHS  . insulin aspart  0-9 Units Subcutaneous TID WC  . [START ON 04/07/2015] insulin detemir  36 Units Subcutaneous Daily  . multivitamin with minerals  1 tablet Oral Daily  . nystatin   Topical BID   Continuous Infusions: . sodium chloride 75 mL/hr at 04/06/15 1104   PRN Meds:.hydrALAZINE, ondansetron (ZOFRAN) IV     Previous Inpatient Admission/Date/Reason:  None reported   Emotional Health/Current Symptoms  Suicide/Self Harm: None Reported Suicide Attempt in Past (date/description):  Patient denies any SI or HI.  Other Harmful Behavior (ex. homicidal ideation) (describe):  None reported   Psychotic/Dissociative Symptoms  Psychotic/Dissociative Symptoms: None Reported Other Psychotic/Dissociative Symptoms:  N/A   Attention/Behavioral  Symptoms  Attention/Behavioral Symptoms: Within Normal Limits Other Attention/Behavioral Symptoms:  Patient engaged during assessment.   Cognitive Impairment  Cognitive Impairment:  Within Normal Limits Other Cognitive Impairment:  Patient alert and oriented.   Mood and Adjustment  Mood and Adjustment:  Mood Congruent   Stress, Anxiety, Trauma, Any Recent Loss/Stressor  Stress, Anxiety, Trauma, Any Recent Loss/Stressor: Anxiety Anxiety (frequency):  Patient reports that anxiety increases when in large crowds.  Phobia (specify):  N/A  Compulsive Behavior (specify):  N/A  Obsessive Behavior (specify):  N/A  Other Stress, Anxiety, Trauma, Any Recent Loss/Stressor:  N/A   Substance Abuse/Use  Substance Abuse/Use: None SBIRT Completed (please refer for detailed history): No Self-reported Substance Use (last use and frequency):  Patient denies any substance use.  Urinary Drug Screen Completed: Yes Alcohol Level:  <5   Environment/Housing/Living Arrangement  Environmental/Housing/Living Arrangement: Stable Housing Who is in the Home:  Alone  Emergency Contact:  Dtrs   Financial  Financial: Medicaid   Patient's Strengths and Goals  Patient's Strengths and Goals (patient's own words):  Patient reports family assists with transportation.    Clinical Social Worker's Interpretive Summary  Clinical Social Workers Interpretive Summary:    CSW and psych MD met with patient at bedside. Patient reports that she lives at home alone. Patient had a son that passed away but patient has two dtrs and 4 grandchildren. Patient reports she fell in June 2016 at the [place] office and has required more treatment since then. Patient has been in and out of the hospital due to abnormal labs and unable to control her diabetes. Patient reports that she is cooperative with medications and is compliant with MD recommendations.  Patient reports PCP has been providing medication  management for anxiety and depression. Patient is currently prescribed Buspar and Prozac and reports some financial issues with medications due to insurance limitations. Patient is interested in medication management from psych MD at the hospital.  [facility] will continue to follow.   Disposition  Disposition: Outpatient Referral Made/Needed        Sindy Messing, LCSW [phone]

## 2015-04-07 ENCOUNTER — Inpatient Hospital Stay (HOSPITAL_COMMUNITY): Payer: Medicaid Other

## 2015-04-07 LAB — COMPREHENSIVE METABOLIC PANEL
ALBUMIN: 1.7 g/dL — AB (ref 3.5–5.0)
ALK PHOS: 122 U/L (ref 38–126)
ALT: 9 U/L — AB (ref 14–54)
ANION GAP: 5 (ref 5–15)
AST: 13 U/L — ABNORMAL LOW (ref 15–41)
BILIRUBIN TOTAL: 0.3 mg/dL (ref 0.3–1.2)
BUN: 13 mg/dL (ref 6–20)
CALCIUM: 8.2 mg/dL — AB (ref 8.9–10.3)
CO2: 27 mmol/L (ref 22–32)
CREATININE: 0.39 mg/dL — AB (ref 0.44–1.00)
Chloride: 102 mmol/L (ref 101–111)
GFR calc non Af Amer: >60 mL/min (ref >60)
GLUCOSE: 204 mg/dL — AB (ref 65–99)
Potassium: 4.1 mmol/L (ref 3.5–5.1)
Sodium: 134 mmol/L — ABNORMAL LOW (ref 135–145)
TOTAL PROTEIN: 6.1 g/dL — AB (ref 6.5–8.1)

## 2015-04-07 LAB — CBC
HCT: 33 % — ABNORMAL LOW (ref 36.0–46.0)
HEMOGLOBIN: 10.1 g/dL — AB (ref 12.0–15.0)
MCH: 29.7 pg (ref 26.0–34.0)
MCHC: 30.6 g/dL (ref 30.0–36.0)
MCV: 97.1 fL (ref 78.0–100.0)
PLATELETS: 409 10*3/uL — AB (ref 150–400)
RBC: 3.4 MIL/uL — ABNORMAL LOW (ref 3.87–5.11)
RDW: 13.4 % (ref 11.5–15.5)
WBC: 17.6 10*3/uL — ABNORMAL HIGH (ref 4.0–10.5)

## 2015-04-07 LAB — HEMOGLOBIN A1C
Hgb A1c MFr Bld: 16.6 % — ABNORMAL HIGH (ref 4.8–5.6)
Mean Plasma Glucose: 430 mg/dL

## 2015-04-07 LAB — GLUCOSE, CAPILLARY
GLUCOSE-CAPILLARY: 157 mg/dL — AB (ref 65–99)
GLUCOSE-CAPILLARY: 236 mg/dL — AB (ref 65–99)
Glucose-Capillary: 285 mg/dL — ABNORMAL HIGH (ref 65–99)
Glucose-Capillary: 273 mg/dL — ABNORMAL HIGH (ref 65–99)

## 2015-04-07 MED ORDER — LORAZEPAM 2 MG/ML IJ SOLN
0.5000 mg | INTRAMUSCULAR | Status: AC | PRN
  Administered 2015-04-07 – 2015-04-08 (×2): 0.5 mg via INTRAVENOUS
  Filled 2015-04-07 (×2): qty 1

## 2015-04-07 NOTE — Clinical Social Work Note (Signed)
Clinical Social Work Assessment  Patient Details  Name: Destiny Cole MRN: [id number] Date of Birth: 06/16/1951  Date of referral:  04/07/15               Reason for consult:  Facility Placement                Permission sought to share information with:  Oceanographer granted to share information::  Yes, Verbal Permission Granted  Name::        Agency::     Relationship::     Contact Information:     Housing/Transportation Living arrangements for the past 2 months:  Single Family Home Source of Information:  Patient Patient Interpreter Needed:  None Criminal Activity/Legal Involvement Pertinent to Current Situation/Hospitalization:  No - Comment as needed Significant Relationships:  Adult Children Lives with:  Self Do you feel safe going back to the place where you live?  No Need for family participation in patient care:  Yes (Comment)  Care giving concerns:  CSW reviewed PT evaluation recommending SNF at discharge.    Social Worker assessment / plan:  CSW spoke with patient's daughter, Destiny Cole via phone re: discharge plan. Daughter is agreeable with plan for SNF but is familiar with area SNFs. Daughter informed CSW that she works at [employer] and her husband works at [employer].   Employment status:    Insurance information:  Medicaid In [place] PT Recommendations:  Skilled Nursing Facility, 24 Hour Supervision Information / Referral to community resources:  Skilled Nursing Facility  Patient/Family's Response to care:  CSW faxed information out to [facility] - so far, only [facility] SNFs have offered beds. Daughter informed CSW that she is familiar with [facility] and would prefer that she not go there. CSW awaiting bed offers for [facility].   Patient/Family's Understanding of and Emotional Response to Diagnosis, Current Treatment, and Prognosis:  Patient's  daughter is concerned about patient getting MRI saying she'll need to be sedated most likely.   Emotional Assessment Appearance:  Appears stated age Attitude/Demeanor/Rapport:    Affect (typically observed):  Pleasant, Quiet Orientation:  Oriented to Self, Oriented to Place, Oriented to  Time, Oriented to Situation Alcohol / Substance use:    Psych involvement (Current and /or in the community):     Discharge Needs  Concerns to be addressed:    Readmission within the last 30 days:    Current discharge risk:    Barriers to Discharge:      Arlyss Repress, LCSW 04/07/2015, 11:42 AM

## 2015-04-07 NOTE — Clinical Social Work Placement (Signed)
   CLINICAL SOCIAL WORK PLACEMENT  NOTE  Date:  04/07/2015  Patient Details  Name: Destiny Cole MRN: [id number] Date of Birth: 1951-01-15  Clinical Social Work is seeking post-discharge placement for this patient at the Skilled  Nursing Facility level of care (*CSW will initial, date and re-position this form in  chart as items are completed):  Yes   Patient/family provided with [facility] Clinical Social Work Department's list of facilities offering this level of care within the geographic area requested by the patient (or if unable, by the patient's family).  Yes   Patient/family informed of their freedom to choose among providers that offer the needed level of care, that participate in Medicare, Medicaid or managed care program needed by the patient, have an available bed and are willing to accept the patient.  Yes   Patient/family informed of [facility]'s ownership interest in [facility] and [facility], as well as of the fact that they are under no obligation to receive care at these facilities.  PASRR submitted to EDS on 04/07/15     PASRR number received on 04/07/15     Existing PASRR number confirmed on       FL2 transmitted to all facilities in geographic area requested by pt/family on 04/07/15     FL2 transmitted to all facilities within larger geographic area on 04/07/15     Patient informed that his/her managed care company has contracts with or will negotiate with certain facilities, including the following:        Yes   Patient/family informed of bed offers received.  Patient chooses bed at       Physician recommends and patient chooses bed at      Patient to be transferred to   on  .  Patient to be transferred to facility by       Patient family notified on   of transfer.  Name of family member notified:        PHYSICIAN       Additional Comment:    _______________________________________________ Arlyss Repress, LCSW 04/07/2015, 11:45  AM

## 2015-04-07 NOTE — Progress Notes (Signed)
PROGRESS NOTE  Destiny Cole ZOX:[id number] DOB: 1951-01-07 DOA: 04/05/2015 PCP: Egbert Garibaldi, NP   HPI: Destiny Cole is a 64 y.o. female with history of diabetes mellitus type 2, cerebral palsy with right-sided weakness, hypertension, hyperlipidemia was brought to the ER after patient was found to have difficulty to get out of her commode, found to be in DKA and admitted to SDU. Her DKA has resolved and she was transferred to floor on 8/29  Subjective / 24 H Interval events - no complaints, slight confusion this morning   Assessment/Plan: Principal Problem:   Anxiety state Active Problems:   Cerebral palsy   Hypertension   DKA, type 2   Hyperlipidemia   Facial droop   Leukocytosis   DKA - patient was maintained on an insulin gtt on admission, now on Lantus + SSI.  - A1C 16.6 showing poor control - she endorsed that she was not using her insulin at home   Weakness  - discussed with patient, over the past few weeks (Since end of July per patient) she has been feeling very weak and overall very deconditioned.  - I discussed with her daughter Amil Amen over the phone (she is an Charity fundraiser). She is very concerned and feels like her mother has been acting differently lately. She states that sometimes when she is watching a movie on TV she is under the impression that whatever happens in the movie has happened to her, she also is thinks that her mother has hallucinations. - Her daughter also noticed in the past 1-2 weeks that her mother may have a facial droop.  - obtain MRI to r/o CVA. If positive consult Neuro, complete workup. MRI could not be completed yesterday, I do not see any documentation what happened. Will re-attempt today - already on aspirin - PT recommending SNF, placement pending MRI results.   DM - Lantus + SSI as above, A1C very high  Leukocytosis - no apparent source of infection, UA clean, CXR without acute findings - has had mild elevated bilirubin, US  abdomen with cholelithiasis without cholecystitis.  - she is afebrile, tolerating diet   HLD - resumed Lipitor  HTN - hold Prinizide for now  Anxiety / agoraphobia   Diet: Diet Carb Modified Fluid consistency:: Thin; Room service appropriate?: Yes Fluids: NS DVT Prophylaxis: Lovenox  Code Status: Partial Code Family Communication: d/w daughter over the phone  Disposition Plan: SNF when ready   Consultants:  Psychiatry  Procedures:  None    Antibiotics  Anti-infectives    None       Studies  Dg Chest 2 View  04/05/2015   CLINICAL DATA:  Weakness and hypertension.  Diabetic.  EXAM: CHEST  2 VIEW  COMPARISON:  04/01/2015 and 10/30/2014  FINDINGS: Patient is rotated to the right. Lungs are somewhat hypoinflated without definite consolidation or effusion. Cardiomediastinal silhouette and remainder of the exam is unchanged.  IMPRESSION: No active cardiopulmonary disease.   Electronically Signed   By: Elberta Fortis M.D.   On: 04/05/2015 18:55   Ct Head Wo Contrast  04/05/2015   CLINICAL DATA:  Progressive weakness.  Fell.  EXAM: CT HEAD WITHOUT CONTRAST  TECHNIQUE: Contiguous axial images were obtained from the base of the skull through the vertex without intravenous contrast.  COMPARISON:  10/28/2012, 12/12/2008.  FINDINGS: There is no intracranial hemorrhage or extra-axial fluid collection. There is chronic encephalomalacia in the left centrum semiovale. There is mild generalized atrophy. The brain is otherwise unremarkable and unchanged.  Calvarium and skullbase are intact. There is no bony abnormality.  IMPRESSION: Chronic encephalomalacia in the left centrum semiovale without significant interval change. Mild generalized atrophy. No acute findings.   Electronically Signed   By: Ellery Plunk M.D.   On: 04/05/2015 21:26   US Abdomen Complete  04/06/2015   CLINICAL DATA:  Nausea.  EXAM: ULTRASOUND ABDOMEN COMPLETE  COMPARISON:  None.  FINDINGS: Gallbladder: Numerous  gallstones are noted in the gallbladder. The largest measures 22 mm. The gallbladder is mildly contracted. No gallbladder wall thickening or pericholecystic fluid. No sonographic Murphy sign.  Common bile duct: Diameter: 3.3 mm  Liver: Normal echogenicity without focal lesion or biliary dilatation.  IVC: Normal caliber  Pancreas: Visualized portion unremarkable.  Spleen: Normal size and echogenicity without focal lesions.  Right Kidney: Length: 14.3 cm. Normal renal cortical thickness and echogenicity without focal lesions or hydronephrosis.  Left Kidney: Length: 13.3 cm. Normal renal cortical thickness and echogenicity without focal lesions or hydronephrosis.  Abdominal aorta: Normal caliber  Other findings: No ascites  IMPRESSION: Cholelithiasis without definite sonographic findings for acute cholecystitis.  Normal caliber common bile duct.   Electronically Signed   By: Rudie Meyer M.D.   On: 04/06/2015 16:53   Dg Pelvis Portable  04/05/2015   CLINICAL DATA:  Chronic sacrococcygeal pain, with pressure sores. Recent bilateral lower extremity weakness.  EXAM: PORTABLE PELVIS 1-2 VIEWS  COMPARISON:  04/01/2015  FINDINGS: Negative for fracture or dislocation about either hip. The pubic symphysis and sacroiliac joints appear grossly intact. There is no bone lesion or bony destruction.  IMPRESSION: Negative for acute fracture.   Electronically Signed   By: Ellery Plunk M.D.   On: 04/05/2015 21:37    Objective  Filed Vitals:   04/06/15 2106 04/07/15 0010 04/07/15 0529 04/07/15 1307  BP: 113/60 144/71 144/66 136/57  Pulse: 109 92 90 109  Temp: 97.8 F (36.6 C) 98.2 F (36.8 C) 97.7 F (36.5 C) 98.2 F (36.8 C)  TempSrc: Oral Oral Oral Oral  Resp: 18 18 18 20   Height:      Weight:      SpO2: 99% 97% 96% 94%    Intake/Output Summary (Last 24 hours) at 04/07/15 1311 Last data filed at 04/07/15 1300  Gross per 24 hour  Intake 2137.5 ml  Output    820 ml  Net 1317.5 ml   Filed Weights    04/05/15 1627 04/05/15 2203  Weight: 93.441 kg (206 lb) 91.3 kg (201 lb 4.5 oz)    Exam:  GENERAL: NAD  HEENT: head NCAT, no scleral icterus. Pupils round and reactive.   NECK: Supple.   LUNGS: Clear to auscultation. No wheezing or crackles  HEART: Regular rate and rhythm without murmur. 2+ pulses, no JVD, no peripheral edema  ABDOMEN: Soft, nontender, and nondistended. Positive bowel sounds.   NEUROLOGIC: facial droop, non focal otherwise  PSYCHIATRIC: flat mood and affect  SKIN: No ulceration or induration present. Rash under pannus noticed, worse on right. Stage 2 sacral ulcer without drainage or surrounding cellulitis  Data Reviewed: Basic Metabolic Panel:  Recent Labs Lab 04/05/15 1717 04/05/15 2235 04/06/15 0237 04/06/15 0730 04/07/15 0433  NA 130* 131* 136 136 134*  K 4.4 4.1 3.9 4.2 4.1  CL 94* 102 103 102 102  CO2 17* 16* 22 24 27   GLUCOSE 536* 291* 211* 146* 204*  BUN 17 15 14 12 13   CREATININE 0.80 0.66 0.48 0.39* 0.39*  CALCIUM 8.5* 7.8* 8.1* 7.9* 8.2*  Liver Function Tests:  Recent Labs Lab 04/01/15 1955 04/05/15 1717 04/07/15 0433  AST 24 14* 13*  ALT 11* 11* 9*  ALKPHOS 129* 136* 122  BILITOT 2.6* 1.5* 0.3  PROT 7.8 7.2 6.1*  ALBUMIN 2.0* 2.1* 1.7*   CBC:  Recent Labs Lab 04/01/15 1955 04/05/15 1717 04/05/15 2235 04/06/15 0730 04/07/15 0433  WBC 17.3* 19.6* 18.5* 19.7* 17.6*  NEUTROABS 14.5*  --   --  15.7*  --   HGB 11.8* 11.5* 10.1* 10.6* 10.1*  HCT 35.5* 35.8* 31.8* 32.5* 33.0*  MCV 94.2 97.0 95.5 95.0 97.1  PLT 502* 447* 456* 422* 409*   Cardiac Enzymes:  Recent Labs Lab 04/05/15 2235 04/06/15 0237 04/06/15 0730  CKTOTAL 35*  --   --   TROPONINI <0.03 <0.03 0.04*   CBG:  Recent Labs Lab 04/06/15 1152 04/06/15 1818 04/06/15 2111 04/07/15 0747 04/07/15 1213  GLUCAP 237* 195* 237* 157* 285*    Recent Results (from the past 240 hour(s))  MRSA PCR Screening     Status: None   Collection Time: 04/05/15  10:59 PM  Result Value Ref Range Status   MRSA by PCR NEGATIVE NEGATIVE Final    Comment:        The GeneXpert MRSA Assay (FDA approved for NASAL specimens only), is one component of a comprehensive MRSA colonization surveillance program. It is not intended to diagnose MRSA infection nor to guide or monitor treatment for MRSA infections.      Scheduled Meds: . aspirin  81 mg Oral Daily  . atorvastatin  40 mg Oral q1800  . busPIRone  7.5 mg Oral BID  . enoxaparin (LOVENOX) injection  40 mg Subcutaneous Q24H  . feeding supplement (GLUCERNA SHAKE)  237 mL Oral TID BM  . FLUoxetine  20 mg Oral Daily  . insulin aspart  0-5 Units Subcutaneous QHS  . insulin aspart  0-9 Units Subcutaneous TID WC  . insulin detemir  36 Units Subcutaneous Daily  . multivitamin with minerals  1 tablet Oral Daily  . nystatin   Topical BID   Continuous Infusions: . sodium chloride 75 mL/hr at 04/06/15 2250     Pamella Pert, MD [facility] Pager [phone]. If 7 PM - 7 AM, please contact night-coverage at [email or web address], password [facility] 04/07/2015, 1:11 PM  LOS: 2 days

## 2015-04-07 NOTE — Care Management Note (Signed)
Case Management Note  Patient Details  Name: LOUKISHA GUNNERSON MRN: [id number] Date of Birth: 10/10/1950  Subjective/Objective:  Transfer from SDU.From home.CSW following for SNF.                  Action/Plan:d/c plan SNF.   Expected Discharge Date:                  Expected Discharge Plan:  Skilled Nursing Facility  In-House Referral:  Clinical Social Work  Discharge planning Services  CM Consult  Post Acute Care Choice:  NA Choice offered to:  NA  DME Arranged:    DME Agency:     HH Arranged:    HH Agency:     Status of Service:  In process, will continue to follow  Medicare Important Message Given:    Date Medicare IM Given:    Medicare IM give by:    Date Additional Medicare IM Given:    Additional Medicare Important Message give by:     If discussed at Long Length of Stay Meetings, dates discussed:    Additional Comments:  Lanier Clam, RN 04/07/2015, 2:10 PM

## 2015-04-07 NOTE — Consult Note (Signed)
[name] Face-to-Face Psychiatry Consult follow-up  Reason for Consult:  Depression and anxiety Referring Physician:  Dr. Cruzita Lederer Patient Identification: HOLY BATTENFIELD MRN:  [id number] Principal Diagnosis: Anxiety state Diagnosis:   Patient Active Problem List   Diagnosis Date Noted  . Pressure ulcer [L89.90] 04/06/2015  . Facial droop [R29.810] 04/06/2015  . Leukocytosis [D72.829] 04/06/2015  . Anxiety state [F41.1] 04/06/2015  . Failure to thrive in adult [R62.7] 04/05/2015  . DKA, type 2 [E13.10] 04/05/2015  . Hyperlipidemia [E78.5] 04/05/2015  . Diabetic ketoacidosis without coma associated with diabetes mellitus due to underlying condition [E08.10]   . Physical deconditioning [R53.81]   . Acute respiratory depression with hypoxia - Resolved [J96.01] 10/30/2012  . Nausea, vomiting and fever - likely secondary to viral gastroenteritis [R11.10] 10/28/2012  . Diabetes mellitus without complication [Z66.0]   . Cerebral palsy [G80.9]   . Hypertension [I10]     Total Time spent with patient: 30 minutes  Subjective:   Destiny Cole is a 64 y.o. female patient admitted with generalized weakness.  HPI:  Destiny Cole is a 64 y.o. female seen face-to-face for psychiatric consultation and evaluation of depression anxiety. Patient reported she has been living by herself and has a bird as a Loss adjuster, chartered. Patient reported that she has been suffering with diabetes, hypertension, depression and anxiety. Patient reportedly become weak, tired, unable to care for herself and then came to the hospital found herself with the blood sugar and 500 range. Patient reported she has been happy to know that she has been receiving appropriate medication treatment while in the hospital. Patient denied symptoms of depression even though she endorses generalized anxiety during this evaluation. Patient denies suicidal ideation reported that she has 3 children and 4 grandchildren. She wanted to live for her grandchildren.  Patient denies current suicidal/homicidal ideation, intention or plans. Patient also reported she has been compliant with her medication management. Patient.social service regarding transportation issues with her medical offices. Patient is known to have cerebral palsy with right-sided weakness. Patient uses a motor wheelchair or walker. Patient has no previous acute psychiatric hospitalization or outpatient medication management.  HPI Elements:   Location:  Depression and anxiety. Quality:  Fair. Severity:  Generalized weakness. Timing:  Uncontrollable diabetes. Duration:  Per the month. Context:  Unknown psychosocial stressors, chronic disability for psychosocial support.   Interval history: Patient seen today along with psychiatric social service for psychiatric consultation follow-up. Patient reportedly compliant with her medication and treatment given while in the hospital. Patient has denied adverse effect of the medication. Patient has no disturbance of sleep and appetite and also denies suicidal/homicidal thoughts intention or plans. Patient willing to continue her current medication now on even outpatient after discharge from the hospital.  Past Medical History:  Past Medical History  Diagnosis Date  . Diabetes mellitus without complication   . Cerebral palsy   . Hypertension     Past Surgical History  Procedure Laterality Date  . Abdominal hysterectomy    . Mouth surgery    . Cerbral palsy     Family History:  Family History  Problem Relation Age of Onset  . Breast cancer Mother   . Hypertension Mother   . Diabetes Mellitus II Maternal Aunt    Social History:  History  Alcohol Use No     History  Drug Use No    Social History   Social History  . Marital Status: Legally Separated    Spouse Name: N/A  . Number of  Children: N/A  . Years of Education: N/A   Social History Main Topics  . Smoking status: Never Smoker   . Smokeless tobacco: None  . Alcohol Use: No   . Drug Use: No  . Sexual Activity: Not Asked   Other Topics Concern  . None   Social History Narrative   Additional Social History:                          Allergies:   Allergies  Allergen Reactions  . Promethazine     She has a possible respiratory reaction with this medication with RR 4 and hypoxia a few minutes after administration. Other respiratory depressants like benzos should be used with caution.  . Sulfa Antibiotics Swelling and Rash    Labs:  Results for orders placed or performed during the hospital encounter of 04/05/15 (from the past 48 hour(s))  Urinalysis, Routine w reflex microscopic (not at [facility])     Status: Abnormal   Collection Time: 04/05/15  5:12 PM  Result Value Ref Range   Color, Urine YELLOW YELLOW   APPearance CLOUDY (A) CLEAR   Specific Gravity, Urine 1.029 1.005 - 1.030   pH 5.0 5.0 - 8.0   Glucose, UA >1000 (A) NEGATIVE mg/dL   Hgb urine dipstick TRACE (A) NEGATIVE   Bilirubin Urine NEGATIVE NEGATIVE   Ketones, ur >80 (A) NEGATIVE mg/dL   Protein, ur NEGATIVE NEGATIVE mg/dL   Urobilinogen, UA 1.0 0.0 - 1.0 mg/dL   Nitrite NEGATIVE NEGATIVE   Leukocytes, UA NEGATIVE NEGATIVE  Urine microscopic-add on     Status: None   Collection Time: 04/05/15  5:12 PM  Result Value Ref Range   Squamous Epithelial / LPF RARE RARE   WBC, UA 3-6 <3 WBC/hpf   Bacteria, UA RARE RARE   Urine-Other AMORPHOUS URATES/PHOSPHATES   CBC     Status: Abnormal   Collection Time: 04/05/15  5:17 PM  Result Value Ref Range   WBC 19.6 (H) 4.0 - 10.5 K/uL   RBC 3.69 (L) 3.87 - 5.11 MIL/uL   Hemoglobin 11.5 (L) 12.0 - 15.0 g/dL   HCT 35.8 (L) 36.0 - 46.0 %   MCV 97.0 78.0 - 100.0 fL   MCH 31.2 26.0 - 34.0 pg   MCHC 32.1 30.0 - 36.0 g/dL   RDW 13.5 11.5 - 15.5 %   Platelets 447 (H) 150 - 400 K/uL  Comprehensive metabolic panel     Status: Abnormal   Collection Time: 04/05/15  5:17 PM  Result Value Ref Range   Sodium 130 (L) 135 - 145 mmol/L   Potassium  4.4 3.5 - 5.1 mmol/L   Chloride 94 (L) 101 - 111 mmol/L   CO2 17 (L) 22 - 32 mmol/L   Glucose, Bld 536 (H) 65 - 99 mg/dL   BUN 17 6 - 20 mg/dL   Creatinine, Ser 0.80 0.44 - 1.00 mg/dL   Calcium 8.5 (L) 8.9 - 10.3 mg/dL   Total Protein 7.2 6.5 - 8.1 g/dL   Albumin 2.1 (L) 3.5 - 5.0 g/dL   AST 14 (L) 15 - 41 U/L   ALT 11 (L) 14 - 54 U/L   Alkaline Phosphatase 136 (H) 38 - 126 U/L   Total Bilirubin 1.5 (H) 0.3 - 1.2 mg/dL   GFR calc non Af Amer >60 >60 mL/min   GFR calc Af Amer >60 >60 mL/min    Comment: (NOTE) The eGFR has been calculated using  the CKD EPI equation. This calculation has not been validated in all clinical situations. eGFR's persistently <60 mL/min signify possible Chronic Kidney Disease.    Anion gap 19 (H) 5 - 15  Glucose, capillary     Status: Abnormal   Collection Time: 04/05/15 10:10 PM  Result Value Ref Range   Glucose-Capillary 291 (H) 65 - 99 mg/dL  CK     Status: Abnormal   Collection Time: 04/05/15 10:35 PM  Result Value Ref Range   Total CK 35 (L) 38 - 234 U/L  Troponin I (q 6hr x 3)     Status: None   Collection Time: 04/05/15 10:35 PM  Result Value Ref Range   Troponin I <0.03 <0.031 ng/mL    Comment:        NO INDICATION OF MYOCARDIAL INJURY.   TSH     Status: None   Collection Time: 04/05/15 10:35 PM  Result Value Ref Range   TSH 1.157 0.350 - 4.500 uIU/mL  Hemoglobin A1c     Status: Abnormal   Collection Time: 04/05/15 10:35 PM  Result Value Ref Range   Hgb A1c MFr Bld 16.6 (H) 4.8 - 5.6 %    Comment: (NOTE)         Pre-diabetes: 5.7 - 6.4         Diabetes: >6.4         Glycemic control for adults with diabetes: <7.0    Mean Plasma Glucose 430 mg/dL    Comment: (NOTE) Performed At: [facility] [place], [place] [id number] Lindon Romp MD [id number]   Basic metabolic panel (stat then every 4 hours)     Status: Abnormal   Collection Time: 04/05/15 10:35 PM  Result Value Ref Range   Sodium 131 (L) 135  - 145 mmol/L   Potassium 4.1 3.5 - 5.1 mmol/L   Chloride 102 101 - 111 mmol/L   CO2 16 (L) 22 - 32 mmol/L   Glucose, Bld 291 (H) 65 - 99 mg/dL   BUN 15 6 - 20 mg/dL   Creatinine, Ser 0.66 0.44 - 1.00 mg/dL   Calcium 7.8 (L) 8.9 - 10.3 mg/dL   GFR calc non Af Amer >60 >60 mL/min   GFR calc Af Amer >60 >60 mL/min    Comment: (NOTE) The eGFR has been calculated using the CKD EPI equation. This calculation has not been validated in all clinical situations. eGFR's persistently <60 mL/min signify possible Chronic Kidney Disease.    Anion gap 13 5 - 15  CBC     Status: Abnormal   Collection Time: 04/05/15 10:35 PM  Result Value Ref Range   WBC 18.5 (H) 4.0 - 10.5 K/uL   RBC 3.33 (L) 3.87 - 5.11 MIL/uL   Hemoglobin 10.1 (L) 12.0 - 15.0 g/dL   HCT 31.8 (L) 36.0 - 46.0 %   MCV 95.5 78.0 - 100.0 fL   MCH 30.3 26.0 - 34.0 pg   MCHC 31.8 30.0 - 36.0 g/dL   RDW 13.3 11.5 - 15.5 %   Platelets 456 (H) 150 - 400 K/uL  MRSA PCR Screening     Status: None   Collection Time: 04/05/15 10:59 PM  Result Value Ref Range   MRSA by PCR NEGATIVE NEGATIVE    Comment:        The GeneXpert MRSA Assay (FDA approved for NASAL specimens only), is one component of a comprehensive MRSA colonization surveillance program. It is not intended to diagnose MRSA infection  nor to guide or monitor treatment for MRSA infections.   Glucose, capillary     Status: Abnormal   Collection Time: 04/05/15 11:16 PM  Result Value Ref Range   Glucose-Capillary 308 (H) 65 - 99 mg/dL  Glucose, capillary     Status: Abnormal   Collection Time: 04/06/15 12:18 AM  Result Value Ref Range   Glucose-Capillary 238 (H) 65 - 99 mg/dL  Glucose, capillary     Status: Abnormal   Collection Time: 04/06/15  1:21 AM  Result Value Ref Range   Glucose-Capillary 222 (H) 65 - 99 mg/dL  Glucose, capillary     Status: Abnormal   Collection Time: 04/06/15  2:22 AM  Result Value Ref Range   Glucose-Capillary 197 (H) 65 - 99 mg/dL   Troponin I (q 6hr x 3)     Status: None   Collection Time: 04/06/15  2:37 AM  Result Value Ref Range   Troponin I <0.03 <0.031 ng/mL    Comment:        NO INDICATION OF MYOCARDIAL INJURY.   Basic metabolic panel (stat then every 4 hours)     Status: Abnormal   Collection Time: 04/06/15  2:37 AM  Result Value Ref Range   Sodium 136 135 - 145 mmol/L   Potassium 3.9 3.5 - 5.1 mmol/L   Chloride 103 101 - 111 mmol/L   CO2 22 22 - 32 mmol/L   Glucose, Bld 211 (H) 65 - 99 mg/dL   BUN 14 6 - 20 mg/dL   Creatinine, Ser 0.48 0.44 - 1.00 mg/dL   Calcium 8.1 (L) 8.9 - 10.3 mg/dL   GFR calc non Af Amer >60 >60 mL/min   GFR calc Af Amer >60 >60 mL/min    Comment: (NOTE) The eGFR has been calculated using the CKD EPI equation. This calculation has not been validated in all clinical situations. eGFR's persistently <60 mL/min signify possible Chronic Kidney Disease.    Anion gap 11 5 - 15  Glucose, capillary     Status: Abnormal   Collection Time: 04/06/15  3:25 AM  Result Value Ref Range   Glucose-Capillary 162 (H) 65 - 99 mg/dL  Glucose, capillary     Status: Abnormal   Collection Time: 04/06/15  4:26 AM  Result Value Ref Range   Glucose-Capillary 148 (H) 65 - 99 mg/dL  Glucose, capillary     Status: Abnormal   Collection Time: 04/06/15  5:22 AM  Result Value Ref Range   Glucose-Capillary 140 (H) 65 - 99 mg/dL  Glucose, capillary     Status: Abnormal   Collection Time: 04/06/15  6:29 AM  Result Value Ref Range   Glucose-Capillary 155 (H) 65 - 99 mg/dL  Troponin I (q 6hr x 3)     Status: Abnormal   Collection Time: 04/06/15  7:30 AM  Result Value Ref Range   Troponin I 0.04 (H) <0.031 ng/mL    Comment:        PERSISTENTLY INCREASED TROPONIN VALUES IN THE RANGE OF 0.04-0.49 ng/mL CAN BE SEEN IN:       -UNSTABLE ANGINA       -CONGESTIVE HEART FAILURE       -MYOCARDITIS       -CHEST TRAUMA       -ARRYHTHMIAS       -LATE PRESENTING MYOCARDIAL INFARCTION       -COPD   CLINICAL  FOLLOW-UP RECOMMENDED.   CBC with Differential/Platelet     Status: Abnormal   Collection  Time: 04/06/15  7:30 AM  Result Value Ref Range   WBC 19.7 (H) 4.0 - 10.5 K/uL    Comment: WHITE COUNT CONFIRMED ON SMEAR   RBC 3.42 (L) 3.87 - 5.11 MIL/uL   Hemoglobin 10.6 (L) 12.0 - 15.0 g/dL   HCT 32.5 (L) 36.0 - 46.0 %   MCV 95.0 78.0 - 100.0 fL   MCH 31.0 26.0 - 34.0 pg   MCHC 32.6 30.0 - 36.0 g/dL   RDW 13.4 11.5 - 15.5 %   Platelets 422 (H) 150 - 400 K/uL   Neutrophils Relative % 80 (H) 43 - 77 %   Lymphocytes Relative 10 (L) 12 - 46 %   Monocytes Relative 8 3 - 12 %   Eosinophils Relative 0 0 - 5 %   Basophils Relative 2 (H) 0 - 1 %   Neutro Abs 15.7 (H) 1.7 - 7.7 K/uL   Lymphs Abs 2.0 0.7 - 4.0 K/uL   Monocytes Absolute 1.6 (H) 0.1 - 1.0 K/uL   Eosinophils Absolute 0.0 0.0 - 0.7 K/uL   Basophils Absolute 0.4 (H) 0.0 - 0.1 K/uL   RBC Morphology POLYCHROMASIA PRESENT    WBC Morphology TOXIC GRANULATION   Basic metabolic panel (stat then every 4 hours)     Status: Abnormal   Collection Time: 04/06/15  7:30 AM  Result Value Ref Range   Sodium 136 135 - 145 mmol/L   Potassium 4.2 3.5 - 5.1 mmol/L   Chloride 102 101 - 111 mmol/L   CO2 24 22 - 32 mmol/L   Glucose, Bld 146 (H) 65 - 99 mg/dL   BUN 12 6 - 20 mg/dL   Creatinine, Ser 0.39 (L) 0.44 - 1.00 mg/dL   Calcium 7.9 (L) 8.9 - 10.3 mg/dL   GFR calc non Af Amer >60 >60 mL/min   GFR calc Af Amer >60 >60 mL/min    Comment: (NOTE) The eGFR has been calculated using the CKD EPI equation. This calculation has not been validated in all clinical situations. eGFR's persistently <60 mL/min signify possible Chronic Kidney Disease.    Anion gap 10 5 - 15  Glucose, capillary     Status: Abnormal   Collection Time: 04/06/15  7:45 AM  Result Value Ref Range   Glucose-Capillary 143 (H) 65 - 99 mg/dL   Comment 1 Notify RN    Comment 2 Document in Chart   Glucose, capillary     Status: Abnormal   Collection Time: 04/06/15 11:52 AM   Result Value Ref Range   Glucose-Capillary 237 (H) 65 - 99 mg/dL  Glucose, capillary     Status: Abnormal   Collection Time: 04/06/15  6:18 PM  Result Value Ref Range   Glucose-Capillary 195 (H) 65 - 99 mg/dL  Glucose, capillary     Status: Abnormal   Collection Time: 04/06/15  9:11 PM  Result Value Ref Range   Glucose-Capillary 237 (H) 65 - 99 mg/dL  Comprehensive metabolic panel     Status: Abnormal   Collection Time: 04/07/15  4:33 AM  Result Value Ref Range   Sodium 134 (L) 135 - 145 mmol/L   Potassium 4.1 3.5 - 5.1 mmol/L   Chloride 102 101 - 111 mmol/L   CO2 27 22 - 32 mmol/L   Glucose, Bld 204 (H) 65 - 99 mg/dL   BUN 13 6 - 20 mg/dL   Creatinine, Ser 0.39 (L) 0.44 - 1.00 mg/dL   Calcium 8.2 (L) 8.9 - 10.3 mg/dL  Total Protein 6.1 (L) 6.5 - 8.1 g/dL   Albumin 1.7 (L) 3.5 - 5.0 g/dL   AST 13 (L) 15 - 41 U/L   ALT 9 (L) 14 - 54 U/L   Alkaline Phosphatase 122 38 - 126 U/L   Total Bilirubin 0.3 0.3 - 1.2 mg/dL   GFR calc non Af Amer >60 >60 mL/min   GFR calc Af Amer >60 >60 mL/min    Comment: (NOTE) The eGFR has been calculated using the CKD EPI equation. This calculation has not been validated in all clinical situations. eGFR's persistently <60 mL/min signify possible Chronic Kidney Disease.    Anion gap 5 5 - 15  CBC     Status: Abnormal   Collection Time: 04/07/15  4:33 AM  Result Value Ref Range   WBC 17.6 (H) 4.0 - 10.5 K/uL   RBC 3.40 (L) 3.87 - 5.11 MIL/uL   Hemoglobin 10.1 (L) 12.0 - 15.0 g/dL   HCT 33.0 (L) 36.0 - 46.0 %   MCV 97.1 78.0 - 100.0 fL   MCH 29.7 26.0 - 34.0 pg   MCHC 30.6 30.0 - 36.0 g/dL   RDW 13.4 11.5 - 15.5 %   Platelets 409 (H) 150 - 400 K/uL  Glucose, capillary     Status: Abnormal   Collection Time: 04/07/15  7:47 AM  Result Value Ref Range   Glucose-Capillary 157 (H) 65 - 99 mg/dL   Comment 1 Notify RN    Comment 2 Document in Chart   Glucose, capillary     Status: Abnormal   Collection Time: 04/07/15 12:13 PM  Result Value  Ref Range   Glucose-Capillary 285 (H) 65 - 99 mg/dL   Comment 1 Notify RN    Comment 2 Document in Chart     Vitals: Blood pressure 136/57, pulse 109, temperature 98.2 F (36.8 C), temperature source Oral, resp. rate 20, height 5' 1"  (1.549 m), weight 91.3 kg (201 lb 4.5 oz), SpO2 94 %.  Risk to Self: Is patient at risk for suicide?: No Risk to Others:   Prior Inpatient Therapy:   Prior Outpatient Therapy:    Current Facility-Administered Medications  Medication Dose Route Frequency Provider Last Rate Last Dose  . 0.9 %  sodium chloride infusion   Intravenous Continuous Caren Griffins, MD 75 mL/hr at 04/06/15 2250    . aspirin chewable tablet 81 mg  81 mg Oral Daily Rise Patience, MD   81 mg at 04/07/15 0834  . atorvastatin (LIPITOR) tablet 40 mg  40 mg Oral q1800 Caren Griffins, MD   40 mg at 04/06/15 1800  . busPIRone (BUSPAR) tablet 7.5 mg  7.5 mg Oral BID Ambrose Finland, MD   7.5 mg at 04/07/15 0834  . enoxaparin (LOVENOX) injection 40 mg  40 mg Subcutaneous Q24H Rise Patience, MD   40 mg at 04/06/15 2121  . feeding supplement (GLUCERNA SHAKE) (GLUCERNA SHAKE) liquid 237 mL  237 mL Oral TID BM Clayton Bibles, RD   237 mL at 04/07/15 0840  . FLUoxetine (PROZAC) capsule 20 mg  20 mg Oral Daily Ambrose Finland, MD   20 mg at 04/07/15 0835  . hydrALAZINE (APRESOLINE) injection 10 mg  10 mg Intravenous Q4H PRN Rise Patience, MD      . insulin aspart (novoLOG) injection 0-5 Units  0-5 Units Subcutaneous QHS Caren Griffins, MD   2 Units at 04/06/15 2121  . insulin aspart (novoLOG) injection 0-9 Units  0-9 Units Subcutaneous  TID WC Costin Karlyne Greenspan, MD   5 Units at 04/07/15 1254  . insulin detemir (LEVEMIR) injection 36 Units  36 Units Subcutaneous Daily Rise Patience, MD   36 Units at 04/07/15 [phone]  . LORazepam (ATIVAN) injection 0.5 mg  0.5 mg Intravenous Q5 Min x 2 PRN Caren Griffins, MD      . multivitamin with minerals tablet 1 tablet  1  tablet Oral Daily Clayton Bibles, RD   1 tablet at 04/07/15 [phone]  . nystatin (MYCOSTATIN/NYSTOP) topical powder   Topical BID Caren Griffins, MD      . ondansetron [facility]) injection 4 mg  4 mg Intravenous Q6H PRN Rise Patience, MD   4 mg at 04/06/15 1618    Musculoskeletal: Strength & Muscle Tone: decreased Gait & Station: unable to stand Patient leans: N/A  Psychiatric Specialty Exam: Physical Exam as per history and physical  ROS generalized weakness and anxiety. Patient denied nausea, vomiting, diarrhea, abdominal pain, shortness of breath and chest pain.   Blood pressure 136/57, pulse 109, temperature 98.2 F (36.8 C), temperature source Oral, resp. rate 20, height 5' 1"  (1.549 m), weight 91.3 kg (201 lb 4.5 oz), SpO2 94 %.Body mass index is 38.05 kg/(m^2).  General Appearance: Fairly Groomed  Engineer, water::  Good  Speech:  Clear and Coherent  Volume:  Normal  Mood:  Anxious  Affect:  Appropriate and Congruent  Thought Process:  Coherent and Goal Directed  Orientation:  Full (Time, Place, and Person)  Thought Content:  WDL  Suicidal Thoughts:  No  Homicidal Thoughts:  No  Memory:  Immediate;   Good Recent;   Good Remote;   Good  Judgement:  Intact  Insight:  Fair  Psychomotor Activity:  Decreased  Concentration:  Good  Recall:  Good  Fund of Knowledge:Good  Language: Good  Akathisia:  Negative  Handed:  Right  AIMS (if indicated):     Assets:  Communication Skills Desire for Improvement Financial Resources/Insurance Housing Leisure Time Resilience Social Support Transportation  ADL's:  Impaired  Cognition: WNL  Sleep:      Medical Decision Making: New problem, with additional work up planned, Review of Psycho-Social Stressors (1), Review or order clinical lab tests (1), Review of Last Therapy Session (1), Review or order medicine tests (1), Review of Medication Regimen & Side Effects (2) and Review of New Medication or Change in Dosage (2)  Treatment  Plan Summary: Patient has been compliant with medication without adverse effects and reportedly less depressed and anxious today. Daily contact with patient to assess and evaluate symptoms and progress in treatment and Medication management  Plan:  Continue fluoxetine 20 mg PO Qam for depression Continue Buspirone 7.5 mg P BID for anxiety  Patient does not meet criteria for psychiatric inpatient admission. Supportive therapy provided about ongoing stressors.  Appreciate psychiatric consultation and will sign off at this time Please contact [phone] or [phone] if needs further assistance   Disposition: Discharge to home when medically stable and out patient medication management.   Waylin Dorko,JANARDHAHA R. 04/07/2015 1:42 PM

## 2015-04-07 NOTE — Progress Notes (Signed)
Clinical Social Work  CSW rounded with psych MD to evaluate patient. Patient laying in bed and reports she is feeling better. Patient has been compliant with medication changes and reports good sleep and appetite. Patient has no immediate concerns at this time. Per chart review, unit CSW working with patient for SNF placement once medically stable. Psych CSW to continue to follow to offer support throughout hospital stay.  [place], [place] [phone]

## 2015-04-08 ENCOUNTER — Encounter
Admission: RE | Admit: 2015-04-08 | Discharge: 2015-04-08 | Disposition: A | Discharge status: Home / Self Care | Payer: Medicaid Other | Source: Ambulatory Visit | Attending: Internal Medicine | Admitting: Internal Medicine

## 2015-04-08 DIAGNOSIS — F411 Generalized anxiety disorder: Secondary | ICD-10-CM

## 2015-04-08 DIAGNOSIS — E119 Type 2 diabetes mellitus without complications: Secondary | ICD-10-CM | POA: Insufficient documentation

## 2015-04-08 DIAGNOSIS — Z794 Long term (current) use of insulin: Secondary | ICD-10-CM | POA: Insufficient documentation

## 2015-04-08 LAB — CBC
HCT: 30.2 % — ABNORMAL LOW (ref 36.0–46.0)
HEMOGLOBIN: 9.9 g/dL — AB (ref 12.0–15.0)
MCH: 31.4 pg (ref 26.0–34.0)
MCHC: 32.8 g/dL (ref 30.0–36.0)
MCV: 95.9 fL (ref 78.0–100.0)
PLATELETS: 431 10*3/uL — AB (ref 150–400)
RBC: 3.15 MIL/uL — AB (ref 3.87–5.11)
RDW: 13.8 % (ref 11.5–15.5)
WBC: 14.3 10*3/uL — AB (ref 4.0–10.5)

## 2015-04-08 LAB — GLUCOSE, CAPILLARY
GLUCOSE-CAPILLARY: 181 mg/dL — AB (ref 65–99)
Glucose-Capillary: 257 mg/dL — ABNORMAL HIGH (ref 65–99)
Glucose-Capillary: 250 mg/dL — ABNORMAL HIGH (ref 65–99)

## 2015-04-08 MED ORDER — FUROSEMIDE 10 MG/ML IJ SOLN
40.0000 mg | Freq: Once | INTRAMUSCULAR | Status: AC
  Administered 2015-04-08: 40 mg via INTRAVENOUS
  Filled 2015-04-08: qty 4

## 2015-04-08 MED ORDER — INSULIN ASPART 100 UNIT/ML CARTRIDGE (PENFILL): SUBCUTANEOUS | Status: DC

## 2015-04-08 MED ORDER — BUSPIRONE HCL 7.5 MG PO TABS: 7.5000 mg | ORAL_TABLET | Freq: Two times a day (BID) | ORAL | Status: DC

## 2015-04-08 MED ORDER — FLUOXETINE HCL 20 MG PO CAPS: 20.0000 mg | ORAL_CAPSULE | Freq: Every day | ORAL | Status: DC

## 2015-04-08 MED ORDER — LEVOFLOXACIN IN D5W 500 MG/100ML IV SOLN
500.0000 mg | Freq: Every day | INTRAVENOUS | Status: DC
  Administered 2015-04-08: 500 mg via INTRAVENOUS
  Filled 2015-04-08: qty 100

## 2015-04-08 MED ORDER — LEVOFLOXACIN 500 MG PO TABS: 500.0000 mg | ORAL_TABLET | Freq: Every day | ORAL | Status: DC

## 2015-04-08 NOTE — Clinical Social Work Placement (Signed)
Patient is set to discharge to [facility] today. Patient & Amil Amen aware. Discharge packet given to RN, April. [facility] called for transport.     Lincoln Maxin, LCSW [facility] Clinical Social Worker cell #: [phone]    CLINICAL SOCIAL WORK PLACEMENT  NOTE  Date:  04/08/2015  Patient Details  Name: Destiny Cole MRN: [id number] Date of Birth: 1951/01/09  Clinical Social Work is seeking post-discharge placement for this patient at the Skilled  Nursing Facility level of care (*CSW will initial, date and re-position this form in  chart as items are completed):  Yes   Patient/family provided with [facility] Clinical Social Work Department's list of facilities offering this level of care within the geographic area requested by the patient (or if unable, by the patient's family).  Yes   Patient/family informed of their freedom to choose among providers that offer the needed level of care, that participate in Medicare, Medicaid or managed care program needed by the patient, have an available bed and are willing to accept the patient.  Yes   Patient/family informed of [place]'s ownership interest in [facility] and [facility], as well as of the fact that they are under no obligation to receive care at these facilities.  PASRR submitted to EDS on 04/07/15     PASRR number received on 04/07/15     Existing PASRR number confirmed on       FL2 transmitted to all facilities in geographic area requested by pt/family on 04/07/15     FL2 transmitted to all facilities within larger geographic area on 04/07/15     Patient informed that his/her managed care company has contracts with or will negotiate with certain facilities, including the following:        Yes   Patient/family informed of bed offers received.  Patient chooses bed at [facility]     Physician recommends and patient chooses bed at      Patient to be transferred to [facility] on  04/08/15.  Patient to be transferred to facility by [facility]     Patient family notified on 04/08/15 of transfer.  Name of family member notified:  patient's daughter, Amil Amen via phone     PHYSICIAN       Additional Comment:    _______________________________________________ Arlyss Repress, LCSW 04/08/2015, 3:48 PM

## 2015-04-08 NOTE — Progress Notes (Signed)
Report called to SNF, Destiny Cole ,Spoke to [employer] LPN

## 2015-04-08 NOTE — Progress Notes (Signed)
Physical Therapy Treatment Patient Details Name: Destiny Cole MRN: [id number] DOB: [birth year] Today's Date: 04/08/2015    History of Present Illness Destiny Cole is a 64 y.o. female with history of diabetes mellitus type 2, cerebral palsy with right-sided weakness, hypertension, hyperlipidemia was brought to the ER after patient was found to have difficulty to get out of her commode.    PT Comments    Pt required + 2 assist to get OOB to [facility].  HIGH anxiety/fear of falling.  Pt was unable to support self in standing and required + 2 assist to complete pivot turn and control descend.  Pt was unable to take any functional steps and declined sitting in recliner.  Near panic when I asked her.  "I got to lay down", she repeated.  Assisted off BSC and pivoted back to bed.    Follow Up Recommendations  SNF     Equipment Recommendations       Recommendations for Other Services       Precautions / Restrictions Precautions Precautions: Fall Precaution Comments: Hx CP and R side weakness Restrictions Weight Bearing Restrictions: No    Mobility  Bed Mobility Overal bed mobility: Needs Assistance Bed Mobility: Supine to Sit;Sit to Supine     Supine to sit: Max assist Sit to supine: Total assist;+2 for safety/equipment   General bed mobility comments: pt required much physical assist with bed mobility limited by R UE weakness.  Used bed pad to complete scooting.  Once upright, pt was able to static sit x 4 min but with increased fear of falling/anxiety and readiness to lay back down.   Transfers Overall transfer level: Needs assistance   Transfers: Stand Pivot Transfers;Squat Pivot Transfers   Stand pivot transfers: Max assist;+2 physical assistance;+2 safety/equipment Squat pivot transfers: Max assist;+2 physical assistance;+2 safety/equipment     General transfer comment: assisted pt from elevated bed to [facility] 1/4 turn towards her left.  Great difficulty supporting self in  standing and pt was unable to assist with pivot.  Required assist with control decend and turn completion.  Rec Hoyer Lift transfers for nursing staff.  Ambulation/Gait             General Gait Details: unable to attempt due to low performance level with tranfers.   Stairs            Wheelchair Mobility    Modified Rankin (Stroke Patients Only)       Balance                                    Cognition Arousal/Alertness: Awake/alert Behavior During Therapy: Restless;Anxious                        Exercises      General Comments        Pertinent Vitals/Pain Pain Assessment: Faces Faces Pain Scale: Hurts whole lot Pain Location: "all over" Pain Descriptors / Indicators: Constant Pain Intervention(s): Monitored during session;Repositioned    Home Living                      Prior Function            PT Goals (current goals can now be found in the care plan section) Progress towards PT goals: Progressing toward goals    Frequency  Min 3X/week    PT Plan  Co-evaluation             End of Session Equipment Utilized During Treatment: Gait belt Activity Tolerance: Patient limited by fatigue;Patient limited by pain;Other (comment) (HIGH anxiety)       Time: 1500-1520 PT Time Calculation (min) (ACUTE ONLY): 20 min  Charges:  $Therapeutic Activity: 8-22 mins                    G Codes:      Felecia Shelling  PTA [facility]  Acute  Rehab Pager      [phone]

## 2015-04-08 NOTE — Progress Notes (Signed)
Inpatient Diabetes Program Recommendations  AACE/ADA: New Consensus Statement on Inpatient Glycemic Control (2013)  Target Ranges:  Prepandial:   less than 140 mg/dL      Peak postprandial:   less than 180 mg/dL (1-2 hours)      Critically ill patients:  140 - 180 mg/dL   Reason for Visit: Hyperglycemia  Results for Destiny Cole, Destiny Cole (MRN [id number]) as of 04/08/2015 11:23  Ref. Range 04/07/2015 12:13 04/07/2015 16:14 04/07/2015 21:16 04/08/2015 07:47  Glucose-Capi llary Latest Ref Range: 65-99 mg/dL 213 (H) 086 (H) 578 (H) 181 (H)   MaResults for Destiny Cole, Destiny Cole (MRN [id number]) as of 04/08/2015 11:23  Ref. Range 04/05/2015 22:35  Hemoglobin A1C Latest Ref Range: 4.8-5.6 % 16.6 (H)  Uncontrolled blood sugars at home. May benefit from addition of meal coverage insulin Consider Novolog 4 units tidwc if pt eats > 50% meal. Needs to be discharged on rapid-acting insulin.  Will continue to follow. Thank you. Ailene Ards, RD, LDN, CDE Inpatient Diabetes Coordinator [phone]

## 2015-04-08 NOTE — Progress Notes (Signed)
D/C'd via stretcher to SNF via [facility]

## 2015-04-08 NOTE — Clinical Social Work Placement (Signed)
CSW confirmed with Cala Bradford at [facility] that they are able to offer a bed. Anticipating discharge this afternoon.     Lincoln Maxin, LCSW [facility] Clinical Social Worker cell #: [phone]    CLINICAL SOCIAL WORK PLACEMENT  NOTE  Date:  04/08/2015  Patient Details  Name: Destiny Cole MRN: [id number] Date of Birth: 1950-10-25  Clinical Social Work is seeking post-discharge placement for this patient at the Skilled  Nursing Facility level of care (*CSW will initial, date and re-position this form in  chart as items are completed):  Yes   Patient/family provided with [facility] Clinical Social Work Department's list of facilities offering this level of care within the geographic area requested by the patient (or if unable, by the patient's family).  Yes   Patient/family informed of their freedom to choose among providers that offer the needed level of care, that participate in Medicare, Medicaid or managed care program needed by the patient, have an available bed and are willing to accept the patient.  Yes   Patient/family informed of [facility]'s ownership interest in [facility] and [facility], as well as of the fact that they are under no obligation to receive care at these facilities.  PASRR submitted to EDS on 04/07/15     PASRR number received on 04/07/15     Existing PASRR number confirmed on       FL2 transmitted to all facilities in geographic area requested by pt/family on 04/07/15     FL2 transmitted to all facilities within larger geographic area on 04/07/15     Patient informed that his/her managed care company has contracts with or will negotiate with certain facilities, including the following:        Yes   Patient/family informed of bed offers received.  Patient chooses bed at [facility]     Physician recommends and patient chooses bed at      Patient to be transferred to [facility] on  .  Patient to be  transferred to facility by       Patient family notified on   of transfer.  Name of family member notified:        PHYSICIAN       Additional Comment:    _______________________________________________ Arlyss Repress, LCSW 04/08/2015, 1:37 PM

## 2015-04-08 NOTE — Progress Notes (Signed)
   04/08/15 1100  Clinical Encounter Type  Visited With Patient  Visit Type Initial;Spiritual support;Psychological support  Referral From Physician  Consult/Referral To Chaplain  Spiritual Encounters  Spiritual Needs Prayer;Emotional;Other (Comment) (Pastoral Conversation)  Stress Factors  Patient Stress Factors Health changes;Major life changes  Family Stress Factors None identified   Chaplain visited with the patient per consult to Spiritual Care. Chaplain visited with patient at bedside. Patient was receptive to the Chaplain's visit and proceeded to tell the Chaplain about her health needs and how it has affected her emotionally.  Patient stated that she feels like people think she is "crazy" for her nightly conversations with God, but that her spirituality is what is getting her through her current health situation.  Patient informed the Chaplain that she is "ready to go home."  Chaplain intervention was to give psychosocial and spiritual support; pastoral conversation; and prayer. Chaplain assessed that the patient will need a follow-up visit. The patient was receptive to this.  Chaplain will return at a later time.

## 2015-04-08 NOTE — Discharge Summary (Addendum)
Physician Discharge Summary  Destiny Cole MRN: [id number] DOB/AGE: [birth year] 64 y.o.  PCP: Imelda Pillow, NP   Admit date: 04/05/2015 Discharge date: 04/08/2015  Discharge Diagnoses:     Principal Problem:   Anxiety state Active Problems:   Cerebral palsy   Hypertension   DKA, type 2   Hyperlipidemia   Facial droop   Leukocytosis    Follow-up recommendations Follow-up with PCP in 3-5 days , including all  additional recommended appointments as below Follow-up CBC, CMP, magnesium in 3-5 days      Medication List    STOP taking these medications        lisinopril-hydrochlorothiazide 20-12.5 MG per tablet  Commonly known as:  PRINZIDE,ZESTORETIC     metFORMIN 850 MG tablet  Commonly known as:  GLUCOPHAGE     potassium chloride SA 20 MEQ tablet  Commonly known as:  K-DUR,KLOR-CON      TAKE these medications        aspirin 81 MG chewable tablet  Chew 81 mg by mouth daily.     atorvastatin 40 MG tablet  Commonly known as:  LIPITOR  Take 40 mg by mouth daily.     busPIRone 7.5 MG tablet  Commonly known as:  BUSPAR  Take 1 tablet (7.5 mg total) by mouth 2 (two) times daily.     FIRST AID PLUS LIDOCAINE EX  Apply 1 application topically as needed (apply to broken skin areas).     Fish Oil 1200 MG Caps  Take 1 capsule by mouth daily.     FLUoxetine 20 MG capsule  Commonly known as:  PROZAC  Take 1 capsule (20 mg total) by mouth daily.     insulin aspart cartridge  Commonly known as:  NOVOLOG  Correction coverage:Sensitive (thin, NPO, renal) CBG < 70:implement hypoglycemia protocol CBG 70 - 120:0 units CBG 121 - 150:1 unit CBG 151 - 200:2 units CBG 201 - 250:3 units CBG 251 - 300:5 units CBG 301 - 350:7 units CBG 351 - 4009 units CBG > 400call MD and obtain STAT lab verification     insulin detemir 100 UNIT/ML injection  Commonly known as:  LEVEMIR  Inject 36 Units into the skin at bedtime.     levofloxacin 500 MG tablet   Commonly known as:  LEVAQUIN  Take 1 tablet (500 mg total) by mouth daily.     NO FLUSH NIACIN 500 MG Tabs  Generic drug:  Inositol Niacinate  Take 1 tablet by mouth daily.     ondansetron 4 MG tablet  Commonly known as:  ZOFRAN  Take 1 tablet (4 mg total) by mouth every 8 (eight) hours as needed for nausea.     pantoprazole 40 MG tablet  Commonly known as:  PROTONIX  Take 1 tablet (40 mg total) by mouth daily.     potassium chloride 10 MEQ tablet  Commonly known as:  K-DUR  Take 1 tablet (10 mEq total) by mouth daily.     VITAMIN D3 PO  Take 1 tablet by mouth daily. 100 units/tablet         Discharge Condition: *Stable  Disposition: 01-Home or Self Care   Consults:  Psychiatry  Significant Diagnostic Studies:  Dg Chest 1 View  04/01/2015   CLINICAL DATA:  Hyperglycemia. Unwitnessed fall today. Complains of right rib pain.  EXAM: CHEST  1 VIEW  COMPARISON:  10/29/2012  FINDINGS: The heart size and mediastinal contours are within normal limits. Both lungs are clear. The visualized  skeletal structures are unremarkable.  IMPRESSION: No active disease.   Electronically Signed   By: Kerby Moors M.D.   On: 04/01/2015 21:16   Dg Chest 2 View  04/05/2015   CLINICAL DATA:  Weakness and hypertension.  Diabetic.  EXAM: CHEST  2 VIEW  COMPARISON:  04/01/2015 and 10/30/2014  FINDINGS: Patient is rotated to the right. Lungs are somewhat hypoinflated without definite consolidation or effusion. Cardiomediastinal silhouette and remainder of the exam is unchanged.  IMPRESSION: No active cardiopulmonary disease.   Electronically Signed   By: Marin Olp M.D.   On: 04/05/2015 18:55   Dg Lumbar Spine Complete  04/01/2015   CLINICAL DATA:  Hyperglycemia, fall  EXAM: LUMBAR SPINE - COMPLETE 4+ VIEW  COMPARISON:  None.  FINDINGS: There is no evidence of lumbar spine fracture. Alignment is normal. Intervertebral disc spaces are maintained.  IMPRESSION: Negative.   Electronically Signed   By:  Kathreen Devoid   On: 04/01/2015 21:15   Dg Pelvis 1-2 Views  04/01/2015   CLINICAL DATA:  Hyperglycemia and unwitnessed fall today. Sacrococcygeal pain.  EXAM: PELVIS - 1-2 VIEW  COMPARISON:  None.  FINDINGS: No evidence of pelvic fracture.  No evidence of hip fracture.  IMPRESSION: Negative.   Electronically Signed   By: Nelson Chimes M.D.   On: 04/01/2015 21:16   Dg Sacrum/coccyx  04/01/2015   CLINICAL DATA:  Hyperglycemia and fall. Tail bone pain. Unwitnessed fall today.  EXAM: SACRUM AND COCCYX - 2+ VIEW  COMPARISON:  None.  FINDINGS: No evidence of sacral or coccygeal fracture. Lateral views are markedly degraded by overlying artifact because of patient positioning.  IMPRESSION: No fracture seen. Coccygeal detail is poor because of overlying artifact.   Electronically Signed   By: Nelson Chimes M.D.   On: 04/01/2015 21:16   Ct Head Wo Contrast  04/05/2015   CLINICAL DATA:  Progressive weakness.  Fell.  EXAM: CT HEAD WITHOUT CONTRAST  TECHNIQUE: Contiguous axial images were obtained from the base of the skull through the vertex without intravenous contrast.  COMPARISON:  10/28/2012, 12/12/2008.  FINDINGS: There is no intracranial hemorrhage or extra-axial fluid collection. There is chronic encephalomalacia in the left centrum semiovale. There is mild generalized atrophy. The brain is otherwise unremarkable and unchanged. Calvarium and skullbase are intact. There is no bony abnormality.  IMPRESSION: Chronic encephalomalacia in the left centrum semiovale without significant interval change. Mild generalized atrophy. No acute findings.   Electronically Signed   By: Andreas Newport M.D.   On: 04/05/2015 21:26   Mr Brain Wo Contrast  04/07/2015   CLINICAL DATA:  64 year old female with facial droop, right side weakness. DKA. Initial encounter.  EXAM: MRI HEAD WITHOUT CONTRAST  TECHNIQUE: Multiplanar, multiecho pulse sequences of the brain and surrounding structures were obtained without intravenous  contrast.  COMPARISON:  Head CTs without contrast 04/05/2015 and earlier.  FINDINGS: Study is intermittently degraded by motion artifact despite repeated imaging attempts.  Chronic cystic white matter encephalomalacia in the posterior left frontal lobe. Mild ex vacuo enlargement of the adjacent left lateral ventricle. Mild surrounding white matter gliosis. No associated hemosiderin. Associated volume loss in the posterior body of the corpus callosum. Wallerian degeneration also evident at the left midbrain (series 6, image 12).  Elsewhere cerebral volume is within normal limits for age. No restricted diffusion to suggest acute infarction. No midline shift, mass effect, evidence of mass lesion, ventriculomegaly, extra-axial collection or acute intracranial hemorrhage. Cervicomedullary junction and pituitary are within normal limits.  Major intracranial vascular flow voids are grossly preserved. Negative visualized cervical spine.  Elsewhere gray and white matter signal is within normal limits.  Visible internal auditory structures appear normal. Mastoids are clear. Trace paranasal sinus mucosal thickening. Negative orbit and scalp soft tissues. Normal bone marrow signal.  IMPRESSION: 1.  No acute intracranial abnormality. 2. 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   Electronically Signed   By: Genevie Ann M.D.   On: 04/07/2015 18:52   US Abdomen Complete  04/06/2015   CLINICAL DATA:  Nausea.  EXAM: ULTRASOUND ABDOMEN COMPLETE  COMPARISON:  None.  FINDINGS: Gallbladder: Numerous gallstones are noted in the gallbladder. The largest measures 22 mm. The gallbladder is mildly contracted. No gallbladder wall thickening or pericholecystic fluid. No sonographic Murphy sign.  Common bile duct: Diameter: 3.3 mm  Liver: Normal echogenicity without focal lesion or biliary dilatation.  IVC: Normal caliber  Pancreas: Visualized portion unremarkable.  Spleen: Normal size and  echogenicity without focal lesions.  Right Kidney: Length: 14.3 cm. Normal renal cortical thickness and echogenicity without focal lesions or hydronephrosis.  Left Kidney: Length: 13.3 cm. Normal renal cortical thickness and echogenicity without focal lesions or hydronephrosis.  Abdominal aorta: Normal caliber  Other findings: No ascites  IMPRESSION: Cholelithiasis without definite sonographic findings for acute cholecystitis.  Normal caliber common bile duct.   Electronically Signed   By: Marijo Sanes M.D.   On: 04/06/2015 16:53   Dg Pelvis Portable  04/05/2015   CLINICAL DATA:  Chronic sacrococcygeal pain, with pressure sores. Recent bilateral lower extremity weakness.  EXAM: PORTABLE PELVIS 1-2 VIEWS  COMPARISON:  04/01/2015  FINDINGS: Negative for fracture or dislocation about either hip. The pubic symphysis and sacroiliac joints appear grossly intact. There is no bone lesion or bony destruction.  IMPRESSION: Negative for acute fracture.   Electronically Signed   By: Andreas Newport M.D.   On: 04/05/2015 21:37       Filed Weights   04/05/15 1627 04/05/15 2203  Weight: 93.441 kg (206 lb) 91.3 kg (201 lb 4.5 oz)     Microbiology: Recent Results (from the past 240 hour(s))  MRSA PCR Screening     Status: None   Collection Time: 04/05/15 10:59 PM  Result Value Ref Range Status   MRSA by PCR NEGATIVE NEGATIVE Final    Comment:        The GeneXpert MRSA Assay (FDA approved for NASAL specimens only), is one component of a comprehensive MRSA colonization surveillance program. It is not intended to diagnose MRSA infection nor to guide or monitor treatment for MRSA infections.        Blood Culture    Component Value Date/Time   SDES BLOOD LEFT HAND 10/28/2012 1700   SPECREQUEST BOTTLES DRAWN AEROBIC ONLY 10CC 10/28/2012 1700   CULT NO GROWTH 5 DAYS 10/28/2012 1700   REPTSTATUS 2020-03-913 FINAL 10/28/2012 1700      Labs: Results for orders placed or performed during the  hospital encounter of 04/05/15 (from the past 48 hour(s))  Glucose, capillary     Status: Abnormal   Collection Time: 04/06/15 11:52 AM  Result Value Ref Range   Glucose-Capillary 237 (H) 65 - 99 mg/dL  Glucose, capillary     Status: Abnormal   Collection Time: 04/06/15  6:18 PM  Result Value Ref Range   Glucose-Capillary 195 (H) 65 - 99 mg/dL  Glucose, capillary     Status: Abnormal   Collection Time: 04/06/15  9:11  PM  Result Value Ref Range   Glucose-Capillary 237 (H) 65 - 99 mg/dL  Comprehensive metabolic panel     Status: Abnormal   Collection Time: 04/07/15  4:33 AM  Result Value Ref Range   Sodium 134 (L) 135 - 145 mmol/L   Potassium 4.1 3.5 - 5.1 mmol/L   Chloride 102 101 - 111 mmol/L   CO2 27 22 - 32 mmol/L   Glucose, Bld 204 (H) 65 - 99 mg/dL   BUN 13 6 - 20 mg/dL   Creatinine, Ser 0.39 (L) 0.44 - 1.00 mg/dL   Calcium 8.2 (L) 8.9 - 10.3 mg/dL   Total Protein 6.1 (L) 6.5 - 8.1 g/dL   Albumin 1.7 (L) 3.5 - 5.0 g/dL   AST 13 (L) 15 - 41 U/L   ALT 9 (L) 14 - 54 U/L   Alkaline Phosphatase 122 38 - 126 U/L   Total Bilirubin 0.3 0.3 - 1.2 mg/dL   GFR calc non Af Amer >60 >60 mL/min   GFR calc Af Amer >60 >60 mL/min    Comment: (NOTE) The eGFR has been calculated using the CKD EPI equation. This calculation has not been validated in all clinical situations. eGFR's persistently <60 mL/min signify possible Chronic Kidney Disease.    Anion gap 5 5 - 15  CBC     Status: Abnormal   Collection Time: 04/07/15  4:33 AM  Result Value Ref Range   WBC 17.6 (H) 4.0 - 10.5 K/uL   RBC 3.40 (L) 3.87 - 5.11 MIL/uL   Hemoglobin 10.1 (L) 12.0 - 15.0 g/dL   HCT 33.0 (L) 36.0 - 46.0 %   MCV 97.1 78.0 - 100.0 fL   MCH 29.7 26.0 - 34.0 pg   MCHC 30.6 30.0 - 36.0 g/dL   RDW 13.4 11.5 - 15.5 %   Platelets 409 (H) 150 - 400 K/uL  Glucose, capillary     Status: Abnormal   Collection Time: 04/07/15  7:47 AM  Result Value Ref Range   Glucose-Capillary 157 (H) 65 - 99 mg/dL   Comment 1  Notify RN    Comment 2 Document in Chart   Glucose, capillary     Status: Abnormal   Collection Time: 04/07/15 12:13 PM  Result Value Ref Range   Glucose-Capillary 285 (H) 65 - 99 mg/dL   Comment 1 Notify RN    Comment 2 Document in Chart   Glucose, capillary     Status: Abnormal   Collection Time: 04/07/15  4:14 PM  Result Value Ref Range   Glucose-Capillary 273 (H) 65 - 99 mg/dL  Glucose, capillary     Status: Abnormal   Collection Time: 04/07/15  9:16 PM  Result Value Ref Range   Glucose-Capillary 236 (H) 65 - 99 mg/dL  CBC     Status: Abnormal   Collection Time: 04/08/15  6:03 AM  Result Value Ref Range   WBC 14.3 (H) 4.0 - 10.5 K/uL   RBC 3.15 (L) 3.87 - 5.11 MIL/uL   Hemoglobin 9.9 (L) 12.0 - 15.0 g/dL   HCT 30.2 (L) 36.0 - 46.0 %   MCV 95.9 78.0 - 100.0 fL   MCH 31.4 26.0 - 34.0 pg   MCHC 32.8 30.0 - 36.0 g/dL   RDW 13.8 11.5 - 15.5 %   Platelets 431 (H) 150 - 400 K/uL  Glucose, capillary     Status: Abnormal   Collection Time: 04/08/15  7:47 AM  Result Value Ref Range   Glucose-Capillary  181 (H) 65 - 99 mg/dL     Lipid Panel     Component Value Date/Time   CHOL 109 10/29/2012 0540   TRIG 192* 10/29/2012 0540   HDL 11* 10/29/2012 0540   CHOLHDL 9.9 10/29/2012 0540   VLDL 38 10/29/2012 0540   LDLCALC 60 10/29/2012 0540     Lab Results  Component Value Date   HGBA1C 16.6* 04/05/2015   HGBA1C 10.7* 10/28/2012     Lab Results  Component Value Date   MICROALBUR 75.67* 10/28/2012   LDLCALC 60 10/29/2012   CREATININE 0.39* 04/07/2015     HPI :Destiny Cole is a 64 y.o. female with history of diabetes mellitus type 2, cerebral palsy with right-sided weakness, hypertension, hyperlipidemia was brought to the ER after patient was found to have difficulty to get out of her commode. Patient states she has been feeling weak over the last 1 month and her blood sugars remain consistently running high. She states she has been taking her medications. Today  patient went to the bathroom and after which patient found it very difficult to get up from the position. Patient was brought to the ER. Labs revealed patient is in DKA. Otherwise for the patient's known weakness on the right side from cerebral palsy patient did not have any new deficits. Patient was complaining of pain in the coccygeal area for which x-rays done did not show any acute fracture. Patient has been admitted for diabetic ketoacidosis. Patient denies any chest pain shortness of breath had mild nausea denies any abdominal pain or diarrhea. Patient states she did have one episode of diarrhea 3 days ago.    HOSPITAL COURSE:  DKA - patient was maintained on an insulin gtt on admission, now on Lantus + SSI.  - A1C 16.6 showing poor control - she endorsed that she was not using her insulin at home   Weakness  - discussed with patient, over the past few weeks (Since end of July per patient) she has been feeling very weak and overall very deconditioned.  - I discussed with her daughter [name] over the phone (she is an Therapist, sports). She is very concerned and feels like her mother has been acting differently lately. She states that sometimes when she is watching a movie on TV she is under the impression that whatever happens in the movie has happened to her, she also is thinks that her mother has hallucinations. - Her daughter also noticed in the past 1-2 weeks that her mother may have a facial droop.  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  - she is afebrile, tolerating diet , she refused CT and further lab testing for her abdominal pain, she has not had any nausea , vomiting, diarrhea, LFT's yesterday were normal   HLD - resumed Lipitor  HTN - hold Prinizide for now as blood pressure is consistent controlled  Anxiety / agoraphobia   Discharge Exam: *   Blood pressure 134/61, pulse 98, temperature 98.9 F (37.2 C), temperature source Oral,  resp. rate 18, height 5' 1"  (1.549 m), weight 91.3 kg (201 lb 4.5 oz), SpO2 95 %.   General: Moderately built and nourished.  Eyes: Anicteric no pallor.  ENT: No discharge from the ears eyes nose and mouth.  Neck: No mass felt. No JVD appreciated.  Cardiovascular: S1 and S2 heard.  Respiratory: No rhonchi or crepitations.  Abdomen: Soft nontender bowel sounds present.  Skin: Stage II decubitus in the sacral area and also skin maceration around the right lower quadrant.  Musculoskeletal: No edema.  Psychiatric: Appears normal.  Neurologic: Alert awake oriented to time place and person. Right-sided weakness from known history of cerebral palsy.       Discharge Instructions    Diet - low sodium heart healthy    Complete by:  As directed      Increase activity slowly    Complete by:  As directed            Follow-up Information    Follow up with Millsaps, Luane School, NP. Schedule an appointment as soon as possible for a visit in 3 days.   Contact information:   [facility]  [id number] [phone]       Signed: Reyne Dumas 04/08/2015, 11:30 AM        Time spent >45 mins

## 2015-04-09 ENCOUNTER — Encounter
Admission: RE | Admit: 2015-04-09 | Discharge: 2015-04-09 | Disposition: A | Discharge status: Home / Self Care | Payer: Medicaid Other | Source: Ambulatory Visit | Attending: Internal Medicine | Admitting: Internal Medicine

## 2015-04-09 DIAGNOSIS — E785 Hyperlipidemia, unspecified: Secondary | ICD-10-CM | POA: Diagnosis not present

## 2015-04-09 DIAGNOSIS — E119 Type 2 diabetes mellitus without complications: Secondary | ICD-10-CM | POA: Diagnosis not present

## 2015-04-09 LAB — GLUCOSE, CAPILLARY
Glucose-Capillary: 160 mg/dL — ABNORMAL HIGH (ref 65–99)
Glucose-Capillary: 233 mg/dL — ABNORMAL HIGH (ref 65–99)
Glucose-Capillary: 239 mg/dL — ABNORMAL HIGH (ref 65–99)
Glucose-Capillary: 319 mg/dL — ABNORMAL HIGH (ref 65–99)

## 2015-04-10 DIAGNOSIS — E119 Type 2 diabetes mellitus without complications: Secondary | ICD-10-CM | POA: Diagnosis not present

## 2015-04-10 LAB — GLUCOSE, CAPILLARY
Glucose-Capillary: 141 mg/dL — ABNORMAL HIGH (ref 65–99)
Glucose-Capillary: 183 mg/dL — ABNORMAL HIGH (ref 65–99)
Glucose-Capillary: 247 mg/dL — ABNORMAL HIGH (ref 65–99)

## 2015-04-11 DIAGNOSIS — E119 Type 2 diabetes mellitus without complications: Secondary | ICD-10-CM | POA: Diagnosis not present

## 2015-04-11 LAB — GLUCOSE, CAPILLARY
Glucose-Capillary: 166 mg/dL — ABNORMAL HIGH (ref 65–99)
Glucose-Capillary: 314 mg/dL — ABNORMAL HIGH (ref 65–99)

## 2015-04-12 DIAGNOSIS — E119 Type 2 diabetes mellitus without complications: Secondary | ICD-10-CM | POA: Diagnosis not present

## 2015-04-12 LAB — GLUCOSE, CAPILLARY
Glucose-Capillary: 190 mg/dL — ABNORMAL HIGH (ref 65–99)
Glucose-Capillary: 175 mg/dL — ABNORMAL HIGH (ref 65–99)

## 2015-04-13 DIAGNOSIS — E119 Type 2 diabetes mellitus without complications: Secondary | ICD-10-CM | POA: Diagnosis not present

## 2015-04-13 LAB — GLUCOSE, CAPILLARY
GLUCOSE-CAPILLARY: 183 mg/dL — AB (ref 65–99)
GLUCOSE-CAPILLARY: 334 mg/dL — AB (ref 65–99)

## 2015-04-14 DIAGNOSIS — E119 Type 2 diabetes mellitus without complications: Secondary | ICD-10-CM | POA: Diagnosis not present

## 2015-04-14 LAB — GLUCOSE, CAPILLARY
GLUCOSE-CAPILLARY: 173 mg/dL — AB (ref 65–99)
GLUCOSE-CAPILLARY: 256 mg/dL — AB (ref 65–99)
GLUCOSE-CAPILLARY: 263 mg/dL — AB (ref 65–99)
Glucose-Capillary: 339 mg/dL — ABNORMAL HIGH (ref 65–99)

## 2015-04-15 DIAGNOSIS — E119 Type 2 diabetes mellitus without complications: Secondary | ICD-10-CM | POA: Diagnosis not present

## 2015-04-15 LAB — GLUCOSE, CAPILLARY
GLUCOSE-CAPILLARY: 204 mg/dL — AB (ref 65–99)
GLUCOSE-CAPILLARY: 236 mg/dL — AB (ref 65–99)
GLUCOSE-CAPILLARY: 323 mg/dL — AB (ref 65–99)

## 2015-04-16 DIAGNOSIS — E119 Type 2 diabetes mellitus without complications: Secondary | ICD-10-CM | POA: Diagnosis not present

## 2015-04-16 LAB — CBC WITH DIFFERENTIAL/PLATELET
BASOS ABS: 0.1 10*3/uL (ref 0–0.1)
Basophils Relative: 0 %
Eosinophils Absolute: 0.2 10*3/uL (ref 0–0.7)
Eosinophils Relative: 1 %
HEMATOCRIT: 28.1 % — AB (ref 35.0–47.0)
HEMOGLOBIN: 9.2 g/dL — AB (ref 12.0–16.0)
LYMPHS PCT: 10 %
Lymphs Abs: 1.7 10*3/uL (ref 1.0–3.6)
MCH: 30 pg (ref 26.0–34.0)
MCHC: 32.5 g/dL (ref 32.0–36.0)
MCV: 92.1 fL (ref 80.0–100.0)
MONO ABS: 1.2 10*3/uL — AB (ref 0.2–0.9)
MONOS PCT: 7 %
NEUTROS ABS: 13.5 10*3/uL — AB (ref 1.4–6.5)
Neutrophils Relative %: 82 %
Platelets: 691 10*3/uL — ABNORMAL HIGH (ref 150–440)
RBC: 3.05 MIL/uL — ABNORMAL LOW (ref 3.80–5.20)
RDW: 14.5 % (ref 11.5–14.5)
WBC: 16.7 10*3/uL — ABNORMAL HIGH (ref 3.6–11.0)

## 2015-04-16 LAB — COMPREHENSIVE METABOLIC PANEL
ALK PHOS: 183 U/L — AB (ref 38–126)
ALT: 10 U/L — ABNORMAL LOW (ref 14–54)
AST: 18 U/L (ref 15–41)
Albumin: 1.4 g/dL — ABNORMAL LOW (ref 3.5–5.0)
Anion gap: 7 (ref 5–15)
BILIRUBIN TOTAL: 0.2 mg/dL — AB (ref 0.3–1.2)
BUN: 23 mg/dL — AB (ref 6–20)
CALCIUM: 8.2 mg/dL — AB (ref 8.9–10.3)
CO2: 30 mmol/L (ref 22–32)
Chloride: 100 mmol/L — ABNORMAL LOW (ref 101–111)
Creatinine, Ser: 0.42 mg/dL — ABNORMAL LOW (ref 0.44–1.00)
GFR calc Af Amer: >60 mL/min (ref >60)
Glucose, Bld: 214 mg/dL — ABNORMAL HIGH (ref 65–99)
POTASSIUM: 3.5 mmol/L (ref 3.5–5.1)
Sodium: 137 mmol/L (ref 135–145)
TOTAL PROTEIN: 6.2 g/dL — AB (ref 6.5–8.1)

## 2015-04-16 LAB — GLUCOSE, CAPILLARY
GLUCOSE-CAPILLARY: 224 mg/dL — AB (ref 65–99)
GLUCOSE-CAPILLARY: 195 mg/dL — AB (ref 65–99)

## 2015-04-16 LAB — LIPID PANEL
CHOL/HDL RATIO: 4.6 ratio
CHOLESTEROL: 65 mg/dL (ref 0–200)
HDL: 14 mg/dL — ABNORMAL LOW (ref >40)
LDL Cholesterol: 36 mg/dL (ref 0–99)
Triglycerides: 76 mg/dL (ref <150)
VLDL: 15 mg/dL (ref 0–40)

## 2015-04-16 LAB — MAGNESIUM: Magnesium: 1.6 mg/dL — ABNORMAL LOW (ref 1.7–2.4)

## 2015-04-16 LAB — VITAMIN B12: VITAMIN B 12: 582 pg/mL (ref 180–914)

## 2015-04-16 LAB — TSH: TSH: 1.898 u[IU]/mL (ref 0.350–4.500)

## 2015-04-17 DIAGNOSIS — E119 Type 2 diabetes mellitus without complications: Secondary | ICD-10-CM | POA: Diagnosis not present

## 2015-04-17 LAB — GLUCOSE, CAPILLARY
GLUCOSE-CAPILLARY: 328 mg/dL — AB (ref 65–99)
Glucose-Capillary: 219 mg/dL — ABNORMAL HIGH (ref 65–99)
Glucose-Capillary: 305 mg/dL — ABNORMAL HIGH (ref 65–99)

## 2015-04-18 ENCOUNTER — Other Ambulatory Visit
Admission: RE | Admit: 2015-04-18 | Discharge: 2015-04-18 | Disposition: A | Discharge status: Home / Self Care | Payer: Medicaid Other | Source: Ambulatory Visit | Attending: Internal Medicine | Admitting: Internal Medicine

## 2015-04-18 DIAGNOSIS — R112 Nausea with vomiting, unspecified: Secondary | ICD-10-CM | POA: Diagnosis present

## 2015-04-18 DIAGNOSIS — E119 Type 2 diabetes mellitus without complications: Secondary | ICD-10-CM | POA: Diagnosis not present

## 2015-04-18 DIAGNOSIS — R509 Fever, unspecified: Secondary | ICD-10-CM | POA: Insufficient documentation

## 2015-04-18 LAB — GLUCOSE, CAPILLARY
GLUCOSE-CAPILLARY: 146 mg/dL — AB (ref 65–99)
GLUCOSE-CAPILLARY: 200 mg/dL — AB (ref 65–99)
GLUCOSE-CAPILLARY: 280 mg/dL — AB (ref 65–99)

## 2015-04-18 LAB — CBC
HEMATOCRIT: 28.3 % — AB (ref 35.0–47.0)
HEMOGLOBIN: 8.9 g/dL — AB (ref 12.0–16.0)
MCH: 28.9 pg (ref 26.0–34.0)
MCHC: 31.3 g/dL — ABNORMAL LOW (ref 32.0–36.0)
MCV: 92.5 fL (ref 80.0–100.0)
Platelets: 659 10*3/uL — ABNORMAL HIGH (ref 150–440)
RBC: 3.06 MIL/uL — ABNORMAL LOW (ref 3.80–5.20)
RDW: 14.9 % — AB (ref 11.5–14.5)
WBC: 18.3 10*3/uL — AB (ref 3.6–11.0)

## 2015-04-18 LAB — URINALYSIS COMPLETE WITH MICROSCOPIC (ARMC ONLY)
Bilirubin Urine: NEGATIVE
Ketones, ur: NEGATIVE mg/dL
Nitrite: NEGATIVE
PH: 5 (ref 5.0–8.0)
PROTEIN: NEGATIVE mg/dL
Specific Gravity, Urine: 1.022 (ref 1.005–1.030)

## 2015-04-18 LAB — COMPREHENSIVE METABOLIC PANEL
ALBUMIN: 1.4 g/dL — AB (ref 3.5–5.0)
ALK PHOS: 181 U/L — AB (ref 38–126)
ALT: 11 U/L — ABNORMAL LOW (ref 14–54)
AST: 27 U/L (ref 15–41)
Anion gap: 9 (ref 5–15)
BILIRUBIN TOTAL: 0.5 mg/dL (ref 0.3–1.2)
BUN: 22 mg/dL — AB (ref 6–20)
CO2: 29 mmol/L (ref 22–32)
Calcium: 8.1 mg/dL — ABNORMAL LOW (ref 8.9–10.3)
Chloride: 94 mmol/L — ABNORMAL LOW (ref 101–111)
Creatinine, Ser: 0.56 mg/dL (ref 0.44–1.00)
GFR calc Af Amer: >60 mL/min (ref >60)
GFR calc non Af Amer: >60 mL/min (ref >60)
GLUCOSE: 342 mg/dL — AB (ref 65–99)
POTASSIUM: 4.2 mmol/L (ref 3.5–5.1)
Sodium: 132 mmol/L — ABNORMAL LOW (ref 135–145)
TOTAL PROTEIN: 6.4 g/dL — AB (ref 6.5–8.1)

## 2015-04-19 ENCOUNTER — Inpatient Hospital Stay
Admission: EM | Admit: 2015-04-19 | Discharge: 2015-04-24 | DRG: 872 | Disposition: A | Discharge status: Skilled Nursing Facility | Payer: Medicaid Other | Attending: Internal Medicine | Admitting: Internal Medicine

## 2015-04-19 ENCOUNTER — Inpatient Hospital Stay: Payer: Medicaid Other

## 2015-04-19 ENCOUNTER — Encounter: Payer: Self-pay | Admitting: Emergency Medicine

## 2015-04-19 ENCOUNTER — Emergency Department: Payer: Medicaid Other

## 2015-04-19 DIAGNOSIS — Z7982 Long term (current) use of aspirin: Secondary | ICD-10-CM

## 2015-04-19 DIAGNOSIS — N739 Female pelvic inflammatory disease, unspecified: Secondary | ICD-10-CM | POA: Diagnosis present

## 2015-04-19 DIAGNOSIS — Z794 Long term (current) use of insulin: Secondary | ICD-10-CM

## 2015-04-19 DIAGNOSIS — L8990 Pressure ulcer of unspecified site, unspecified stage: Secondary | ICD-10-CM

## 2015-04-19 DIAGNOSIS — E1165 Type 2 diabetes mellitus with hyperglycemia: Secondary | ICD-10-CM | POA: Diagnosis present

## 2015-04-19 DIAGNOSIS — F329 Major depressive disorder, single episode, unspecified: Secondary | ICD-10-CM | POA: Diagnosis present

## 2015-04-19 DIAGNOSIS — Z79899 Other long term (current) drug therapy: Secondary | ICD-10-CM

## 2015-04-19 DIAGNOSIS — I10 Essential (primary) hypertension: Secondary | ICD-10-CM | POA: Diagnosis present

## 2015-04-19 DIAGNOSIS — I509 Heart failure, unspecified: Secondary | ICD-10-CM | POA: Diagnosis present

## 2015-04-19 DIAGNOSIS — Z882 Allergy status to sulfonamides status: Secondary | ICD-10-CM | POA: Diagnosis not present

## 2015-04-19 DIAGNOSIS — Z888 Allergy status to other drugs, medicaments and biological substances status: Secondary | ICD-10-CM

## 2015-04-19 DIAGNOSIS — A419 Sepsis, unspecified organism: Secondary | ICD-10-CM | POA: Diagnosis present

## 2015-04-19 DIAGNOSIS — R188 Other ascites: Secondary | ICD-10-CM

## 2015-04-19 DIAGNOSIS — I248 Other forms of acute ischemic heart disease: Secondary | ICD-10-CM | POA: Diagnosis present

## 2015-04-19 DIAGNOSIS — L89152 Pressure ulcer of sacral region, stage 2: Secondary | ICD-10-CM | POA: Diagnosis present

## 2015-04-19 DIAGNOSIS — G809 Cerebral palsy, unspecified: Secondary | ICD-10-CM | POA: Diagnosis present

## 2015-04-19 DIAGNOSIS — E785 Hyperlipidemia, unspecified: Secondary | ICD-10-CM | POA: Diagnosis present

## 2015-04-19 DIAGNOSIS — N390 Urinary tract infection, site not specified: Secondary | ICD-10-CM | POA: Diagnosis present

## 2015-04-19 DIAGNOSIS — Z6834 Body mass index (BMI) 34.0-34.9, adult: Secondary | ICD-10-CM

## 2015-04-19 DIAGNOSIS — R296 Repeated falls: Secondary | ICD-10-CM | POA: Diagnosis present

## 2015-04-19 DIAGNOSIS — S301XXA Contusion of abdominal wall, initial encounter: Secondary | ICD-10-CM | POA: Diagnosis not present

## 2015-04-19 DIAGNOSIS — D72829 Elevated white blood cell count, unspecified: Secondary | ICD-10-CM

## 2015-04-19 DIAGNOSIS — A4151 Sepsis due to Escherichia coli [E. coli]: Secondary | ICD-10-CM | POA: Diagnosis present

## 2015-04-19 DIAGNOSIS — R52 Pain, unspecified: Secondary | ICD-10-CM

## 2015-04-19 DIAGNOSIS — Z95828 Presence of other vascular implants and grafts: Secondary | ICD-10-CM

## 2015-04-19 DIAGNOSIS — E669 Obesity, unspecified: Secondary | ICD-10-CM | POA: Diagnosis present

## 2015-04-19 DIAGNOSIS — Z66 Do not resuscitate: Secondary | ICD-10-CM | POA: Diagnosis present

## 2015-04-19 DIAGNOSIS — R079 Chest pain, unspecified: Secondary | ICD-10-CM

## 2015-04-19 HISTORY — DX: Heart failure, unspecified: I50.9

## 2015-04-19 LAB — URINALYSIS COMPLETE WITH MICROSCOPIC (ARMC ONLY)
BILIRUBIN URINE: NEGATIVE
Bacteria, UA: NONE SEEN
Glucose, UA: 50 mg/dL — AB
HGB URINE DIPSTICK: NEGATIVE
KETONES UR: NEGATIVE mg/dL
NITRITE: NEGATIVE
PH: 5 (ref 5.0–8.0)
Protein, ur: NEGATIVE mg/dL
Specific Gravity, Urine: 1.019 (ref 1.005–1.030)

## 2015-04-19 LAB — LIPASE, BLOOD: Lipase: 12 U/L — ABNORMAL LOW (ref 22–51)

## 2015-04-19 LAB — CBC WITH DIFFERENTIAL/PLATELET
BASOS ABS: 0.1 10*3/uL (ref 0–0.1)
BASOS PCT: 0 %
EOS ABS: 0.2 10*3/uL (ref 0–0.7)
Eosinophils Relative: 1 %
HCT: 30.5 % — ABNORMAL LOW (ref 35.0–47.0)
HEMOGLOBIN: 9.8 g/dL — AB (ref 12.0–16.0)
Lymphocytes Relative: 11 %
Lymphs Abs: 2.3 10*3/uL (ref 1.0–3.6)
MCH: 29.5 pg (ref 26.0–34.0)
MCHC: 32.3 g/dL (ref 32.0–36.0)
MCV: 91.3 fL (ref 80.0–100.0)
MONOS PCT: 8 %
Monocytes Absolute: 1.7 10*3/uL — ABNORMAL HIGH (ref 0.2–0.9)
NEUTROS PCT: 80 %
Neutro Abs: 16.2 10*3/uL — ABNORMAL HIGH (ref 1.4–6.5)
Platelets: 739 10*3/uL — ABNORMAL HIGH (ref 150–440)
RBC: 3.34 MIL/uL — ABNORMAL LOW (ref 3.80–5.20)
RDW: 14.6 % — ABNORMAL HIGH (ref 11.5–14.5)
WBC: 20.4 10*3/uL — AB (ref 3.6–11.0)

## 2015-04-19 LAB — TROPONIN I
Troponin I: 0.05 ng/mL — ABNORMAL HIGH (ref <0.031)
Troponin I: 0.05 ng/mL — ABNORMAL HIGH
Troponin I: 0.03 ng/mL
Troponin I: 0.03 ng/mL (ref <0.031)

## 2015-04-19 LAB — GLUCOSE, CAPILLARY
Glucose-Capillary: 144 mg/dL — ABNORMAL HIGH (ref 65–99)
Glucose-Capillary: 174 mg/dL — ABNORMAL HIGH (ref 65–99)
Glucose-Capillary: 132 mg/dL — ABNORMAL HIGH (ref 65–99)
Glucose-Capillary: 174 mg/dL — ABNORMAL HIGH (ref 65–99)

## 2015-04-19 LAB — COMPREHENSIVE METABOLIC PANEL
ALBUMIN: 1.5 g/dL — AB (ref 3.5–5.0)
ALK PHOS: 204 U/L — AB (ref 38–126)
ALT: 11 U/L — ABNORMAL LOW (ref 14–54)
ANION GAP: 8 (ref 5–15)
AST: 19 U/L (ref 15–41)
BUN: 23 mg/dL — ABNORMAL HIGH (ref 6–20)
CO2: 30 mmol/L (ref 22–32)
Calcium: 8.3 mg/dL — ABNORMAL LOW (ref 8.9–10.3)
Chloride: 96 mmol/L — ABNORMAL LOW (ref 101–111)
Creatinine, Ser: 0.49 mg/dL (ref 0.44–1.00)
GFR calc Af Amer: >60 mL/min (ref >60)
GFR calc non Af Amer: >60 mL/min (ref >60)
GLUCOSE: 252 mg/dL — AB (ref 65–99)
POTASSIUM: 4.1 mmol/L (ref 3.5–5.1)
SODIUM: 134 mmol/L — AB (ref 135–145)
Total Bilirubin: 0.3 mg/dL (ref 0.3–1.2)
Total Protein: 7.1 g/dL (ref 6.5–8.1)

## 2015-04-19 LAB — MRSA PCR SCREENING: MRSA BY PCR: NEGATIVE

## 2015-04-19 LAB — HEMOGLOBIN A1C: Hgb A1c MFr Bld: 14.8 % — ABNORMAL HIGH (ref 4.0–6.0)

## 2015-04-19 LAB — TSH: TSH: 4.053 u[IU]/mL (ref 0.350–4.500)

## 2015-04-19 MED ORDER — ACETAMINOPHEN 325 MG PO TABS
650.0000 mg | ORAL_TABLET | Freq: Four times a day (QID) | ORAL | Status: DC | PRN
  Administered 2015-04-19 – 2015-04-22 (×9): 650 mg via ORAL
  Filled 2015-04-19 (×9): qty 2

## 2015-04-19 MED ORDER — BUSPIRONE HCL 5 MG PO TABS
7.5000 mg | ORAL_TABLET | Freq: Two times a day (BID) | ORAL | Status: DC
  Administered 2015-04-19 – 2015-04-24 (×11): 7.5 mg via ORAL
  Filled 2015-04-19 (×12): qty 2

## 2015-04-19 MED ORDER — MORPHINE SULFATE (PF) 2 MG/ML IV SOLN
INTRAVENOUS | Status: AC
  Administered 2015-04-19: 1 mg via INTRAVENOUS
  Filled 2015-04-19: qty 1

## 2015-04-19 MED ORDER — ONDANSETRON HCL 4 MG/2ML IJ SOLN
4.0000 mg | Freq: Four times a day (QID) | INTRAMUSCULAR | Status: DC | PRN
  Administered 2015-04-19 – 2015-04-24 (×7): 4 mg via INTRAVENOUS
  Filled 2015-04-19 (×7): qty 2

## 2015-04-19 MED ORDER — DEXTROSE 5 % IV SOLN
1.0000 g | INTRAVENOUS | Status: DC
  Administered 2015-04-19 – 2015-04-24 (×6): 1 g via INTRAVENOUS
  Filled 2015-04-19 (×7): qty 10

## 2015-04-19 MED ORDER — ASPIRIN 81 MG PO CHEW
81.0000 mg | CHEWABLE_TABLET | Freq: Every day | ORAL | Status: DC
  Administered 2015-04-19 – 2015-04-24 (×6): 81 mg via ORAL
  Filled 2015-04-19 (×6): qty 1

## 2015-04-19 MED ORDER — INSULIN ASPART 100 UNIT/ML ~~LOC~~ SOLN
0.0000 [IU] | Freq: Three times a day (TID) | SUBCUTANEOUS | Status: DC
  Administered 2015-04-19: 10:00:00 3 [IU] via SUBCUTANEOUS
  Administered 2015-04-19: 4 [IU] via SUBCUTANEOUS
  Administered 2015-04-19: 3 [IU] via SUBCUTANEOUS
  Administered 2015-04-20: 17:00:00 11 [IU] via SUBCUTANEOUS
  Administered 2015-04-20 (×2): 4 [IU] via SUBCUTANEOUS
  Administered 2015-04-21 (×2): 7 [IU] via SUBCUTANEOUS
  Filled 2015-04-19 (×2): qty 3
  Filled 2015-04-19: qty 11
  Filled 2015-04-19: qty 7
  Filled 2015-04-19 (×3): qty 4
  Filled 2015-04-19: qty 7
  Filled 2015-04-19: qty 3

## 2015-04-19 MED ORDER — OMEGA-3-ACID ETHYL ESTERS 1 G PO CAPS
1.0000 g | ORAL_CAPSULE | Freq: Every day | ORAL | Status: DC
  Administered 2015-04-19 – 2015-04-24 (×6): 1 g via ORAL
  Filled 2015-04-19 (×6): qty 1

## 2015-04-19 MED ORDER — ACETAMINOPHEN 650 MG RE SUPP: 650.0000 mg | Freq: Four times a day (QID) | RECTAL | Status: DC | PRN

## 2015-04-19 MED ORDER — NIACIN ER (ANTIHYPERLIPIDEMIC) 500 MG PO TBCR
500.0000 mg | EXTENDED_RELEASE_TABLET | Freq: Every day | ORAL | Status: DC
  Administered 2015-04-19 – 2015-04-24 (×6): 500 mg via ORAL
  Filled 2015-04-19 (×11): qty 1

## 2015-04-19 MED ORDER — DOCUSATE SODIUM 100 MG PO CAPS
100.0000 mg | ORAL_CAPSULE | Freq: Two times a day (BID) | ORAL | Status: DC
  Administered 2015-04-19 – 2015-04-21 (×6): 100 mg via ORAL
  Filled 2015-04-19 (×9): qty 1

## 2015-04-19 MED ORDER — ONDANSETRON HCL 4 MG/2ML IJ SOLN
4.0000 mg | Freq: Once | INTRAMUSCULAR | Status: AC | PRN
  Administered 2015-04-19: 4 mg via INTRAVENOUS
  Filled 2015-04-19: qty 2

## 2015-04-19 MED ORDER — HEPARIN SODIUM (PORCINE) 5000 UNIT/ML IJ SOLN
5000.0000 [IU] | Freq: Three times a day (TID) | INTRAMUSCULAR | Status: DC
Start: 2015-04-19 — End: 2015-04-22
  Administered 2015-04-19 – 2015-04-22 (×9): 5000 [IU] via SUBCUTANEOUS
  Filled 2015-04-19 (×9): qty 1

## 2015-04-19 MED ORDER — FLUOXETINE HCL 20 MG PO CAPS
20.0000 mg | ORAL_CAPSULE | Freq: Every day | ORAL | Status: DC
  Administered 2015-04-19 – 2015-04-24 (×6): 20 mg via ORAL
  Filled 2015-04-19 (×6): qty 1

## 2015-04-19 MED ORDER — MORPHINE SULFATE (PF) 2 MG/ML IV SOLN
1.0000 mg | INTRAVENOUS | Status: DC | PRN
  Administered 2015-04-19 – 2015-04-23 (×18): 1 mg via INTRAVENOUS
  Filled 2015-04-19 (×18): qty 1

## 2015-04-19 MED ORDER — ATORVASTATIN CALCIUM 20 MG PO TABS
40.0000 mg | ORAL_TABLET | Freq: Every day | ORAL | Status: DC
  Administered 2015-04-19 – 2015-04-24 (×6): 40 mg via ORAL
  Filled 2015-04-19 (×6): qty 2

## 2015-04-19 MED ORDER — PIPERACILLIN-TAZOBACTAM 3.375 G IVPB
3.3750 g | Freq: Once | INTRAVENOUS | Status: AC
  Administered 2015-04-19: 3.375 g via INTRAVENOUS
  Filled 2015-04-19: qty 50

## 2015-04-19 MED ORDER — VITAMIN D 1000 UNITS PO TABS
1000.0000 [IU] | ORAL_TABLET | Freq: Every day | ORAL | Status: DC
  Administered 2015-04-19 – 2015-04-24 (×6): 1000 [IU] via ORAL
  Filled 2015-04-19 (×6): qty 1

## 2015-04-19 MED ORDER — INSULIN DETEMIR 100 UNIT/ML ~~LOC~~ SOLN
27.0000 [IU] | Freq: Every day | SUBCUTANEOUS | Status: DC
  Administered 2015-04-19 – 2015-04-20 (×2): 27 [IU] via SUBCUTANEOUS
  Filled 2015-04-19 (×3): qty 0.27

## 2015-04-19 MED ORDER — INOSITOL NIACINATE 500 MG PO TABS: 1.0000 | ORAL_TABLET | Freq: Every day | ORAL | Status: DC

## 2015-04-19 MED ORDER — SODIUM CHLORIDE 0.9 % IV SOLN
INTRAVENOUS | Status: DC
  Administered 2015-04-19 – 2015-04-22 (×8): via INTRAVENOUS

## 2015-04-19 MED ORDER — SODIUM CHLORIDE 0.9 % IJ SOLN
3.0000 mL | Freq: Two times a day (BID) | INTRAMUSCULAR | Status: DC
  Administered 2015-04-19 – 2015-04-24 (×9): 3 mL via INTRAVENOUS

## 2015-04-19 NOTE — Plan of Care (Signed)
Problem: Discharge Progression Outcomes Goal: Other Discharge Outcomes/Goals Outcome: Not Progressing Plan of Care Progress to Goal:  Pt w/cerebral palsy states that she has had multiple falls past 2 months.  Pt c/o of severe sacral pain.  Pt had CT of pelvis today and is to have a PT eval.  Walks usually w/walker.  Has been at [facility] for rehab for last 2 wks.  Nausea resolved and pt has good appetite.

## 2015-04-19 NOTE — Progress Notes (Signed)
[facility] [place] [facility]   PATIENT NAME: Destiny Cole    MR#:  [id number]  DATE OF BIRTH:  12/04/1950  SUBJECTIVE:  CHIEF COMPLAINT:   Chief Complaint  Patient presents with  . Chest Pain    Came with sepsis, have complain of pain in right hip.  REVIEW OF SYSTEMS:  CONSTITUTIONAL: No fever, have generalized fatigue or weakness.  EYES: No blurred or double vision.  EARS, NOSE, AND THROAT: No tinnitus or ear pain.  RESPIRATORY: No cough, shortness of breath, wheezing or hemoptysis.  CARDIOVASCULAR: No chest pain, orthopnea, edema.  GASTROINTESTINAL: No nausea, vomiting, diarrhea or abdominal pain.  GENITOURINARY: No dysuria, hematuria.  ENDOCRINE: No polyuria, nocturia,  HEMATOLOGY: No anemia, easy bruising or bleeding SKIN: No rash or lesion. MUSCULOSKELETAL: left hip joint pain or arthritis.   NEUROLOGIC: No tingling, numbness, weakness.  PSYCHIATRY: No anxiety or depression.   ROS  DRUG ALLERGIES:   Allergies  Allergen Reactions  . Promethazine     She has a possible respiratory reaction with this medication with RR 4 and hypoxia a few minutes after administration. Other respiratory depressants like benzos should be used with caution.  . Sulfa Antibiotics Swelling and Rash    VITALS:  Blood pressure 129/54, pulse 87, temperature 97.9 F (36.6 C), temperature source Oral, resp. rate 20, height  (1.676 m), weight 94.031 kg (207 lb 4.8 oz), SpO2 97 %.  PHYSICAL EXAMINATION:  GENERAL:  64 y.o.-year-old patient lying in the bed with no acute distress.  EYES: Pupils equal, round, reactive to light and accommodation. No scleral icterus. Extraocular muscles intact.  HEENT: Head atraumatic, normocephalic. Oropharynx and nasopharynx clear.  NECK:  Supple, no jugular venous distention. No thyroid enlargement, no tenderness.  LUNGS: Normal breath sounds bilaterally, no wheezing, rales,rhonchi or crepitation. No use of accessory muscles  of respiration.  CARDIOVASCULAR: S1, S2 normal. No murmurs, rubs, or gallops.  ABDOMEN: Soft, nontender, nondistended. Bowel sounds present. No organomegaly or mass. Tender on left side of hip and lower back. EXTREMITIES: No pedal edema, cyanosis, or clubbing.  NEUROLOGIC: Cranial nerves II through XII are intact. Muscle strength 4/5 in left side extremities, right side extremities have weakness and contracture . Sensation intact. Gait not checked.  PSYCHIATRIC: The patient is alert and oriented x 3.  SKIN: No obvious rash, lesion, or ulcer.   Physical Exam LABORATORY PANEL:   CBC  Recent Labs Lab 04/19/15 0201  WBC 20.4*  HGB 9.8*  HCT 30.5*  PLT 739*   ------------------------------------------------------------------------------------------------------------------  Chemistries   Recent Labs Lab 04/16/15 0450  04/19/15 0201  NA 137  < > 134*  K 3.5  < > 4.1  CL 100*  < > 96*  CO2 30  < > 30  GLUCOSE 214*  < > 252*  BUN 23*  < > 23*  CREATININE 0.42*  < > 0.49  CALCIUM 8.2*  < > 8.3*  MG 1.6*  --   --   AST 18  < > 19  ALT 10*  < > 11*  ALKPHOS 183*  < > 204*  BILITOT 0.2*  < > 0.3  < > = values in this interval not displayed. ------------------------------------------------------------------------------------------------------------------  Cardiac Enzymes  Recent Labs Lab 04/19/15 0529 04/19/15 1103  TROPONINI 0.05* 0.03   ------------------------------------------------------------------------------------------------------------------  RADIOLOGY:  Ct Pelvis Wo Contrast  04/19/2015   CLINICAL DATA:  64 year old female with a history of left hip and pelvic pain since July. History of 2  falls.  EXAM: CT PELVIS WITHOUT CONTRAST  TECHNIQUE: Multidetector CT imaging of the pelvis was performed following the standard protocol without intravenous contrast.  COMPARISON:  Plain film 04/05/2015, 04/01/2015  FINDINGS: Musculoskeletal:  Mild degenerative disc disease.  No evidence of bony narrowing at the L4-S1 level.  No sacral fracture identified.  Bony pelvis intact without acute fracture. Mild degenerative changes at the bilateral sacroiliac joints.  Unremarkable appearance of the right hip, which is congruent. Minimal joint space narrowing. No subchondral sclerosis or cyst formation. Unremarkable appearance of the proximal right femur.  No evidence of left hip fracture. Unremarkable appearance of the proximal left femur. Mild joint space narrowing without subchondral sclerosis or cyst formation. Left hip is congruent.  Ill-defined intermediate density soft tissue involving the abdominal wall musculature superior to the left iliac bone. Soft tissue dimension measures 9.1 cm by 10.0 cm on axial imaging. There is disruption of the normal architecture of the abdominal wall musculature at this site. Soft tissue extends into the retroperitoneum with ill-defined margins of the left psoas muscle and retroperitoneal fat. Superior extent not entirely imaged.  Intra-abdominal/pelvic:  Unremarkable appearance of visualized bowel.  Atherosclerotic calcifications of the vasculature.  Unremarkable appearance of the urinary bladder.  Surgical changes of hysterectomy.  IMPRESSION: No acute fracture identified, specifically involving the left hip.  Mild osteoarthritis of the bilateral hips.  Abnormal soft tissue disrupting the posterior abdominal wall musculature extending caudally from the left iliac bone, incompletely imaged. Differential diagnosis includes hematoma, or alternatively soft tissue mass there also appears to be involvement of the left psoas musculature and retroperitoneum, again, incompletely imaged. Further evaluation with a contrast-enhanced abdomen/pelvis CT is recommend.  Signed,  Yvone Neu. Loreta Ave, DO  [facility]  [facility]   Electronically Signed   By: Gilmer Mor D.O.   On: 04/19/2015 10:47   Dg Chest Port 1  View  04/19/2015   CLINICAL DATA:  Chest pain and left-sided abdominal pain since July 27th. Worse tonight.  EXAM: PORTABLE CHEST - 1 VIEW  COMPARISON:  04/05/2015  FINDINGS: Shallow inspiration. Cardiac enlargement with mild perihilar infiltration suggesting edema. No blunting of costophrenic angles. No pneumothorax. Increased density over the lung bases is probably due to soft tissue attenuation.  IMPRESSION: Shallow inspiration. Cardiac enlargement with suggestion of perihilar infiltration or edema.   Electronically Signed   By: Burman Nieves M.D.   On: 04/19/2015 03:10    ASSESSMENT AND PLAN:   1. Sepsis:   Blood cultures and urine cultures obtained in the emergency department. She is received 1 dose of IV Zosyn. She is hemodynamically stable. I started on rocephin.  2. Urinary tract infection: Likely source of sepsis, she is also having a sacral decubetus ulcer and c/o pain in lower back- CT showed some fluid collection.  called Surgery consult to help. Will call ID.  3. Chest pain: Reproducible. Upon his elevated but most likely secondary to sepsis. Continue to follow cardiac biomarkers. Continue aspirin 4. Diabetes mellitus type 2: Continue basal insulin dosing adjusted for hospital diet. Place patient on sliding scale insulin as well. 5. Hyperlipidemia: Continue statin therapy. 6. Depression: Continue buspirone and fluoxetine 7. DVT prophylaxis: Heparin 8. GI prophylaxis: Pantoprazole  All the records are reviewed and case discussed with Care Management/Social Workerr. Management plans discussed with the patient, family and they are in agreement.  CODE STATUS: DNR  TOTAL TIME TAKING CARE OF THIS PATIENT: 35 minutes.     POSSIBLE D/C IN 1-2  DAYS, DEPENDING ON CLINICAL CONDITION.   Altamese Dilling M.D on 04/19/2015   Between 7am to 6pm - Pager - [phone]  After 6pm go to [email or web address] - password [email or web address] [facility]  [facility]  Office   [phone]  CC: Primary care physician; Egbert Garibaldi, NP

## 2015-04-19 NOTE — Plan of Care (Signed)
Problem: Discharge Progression Outcomes Goal: Other Discharge Outcomes/Goals Outcome: Progressing Plan of Care Progress to Goal:   Pt is alert and report headache once during shift. Pt report she did not want certain visitors to her room. Pt report being unsure when asked to clarify. Pt report her daughters wants her to sign them over as power of attorney and they are making decisions without asking her. Pt has stage 2 on bottom. No other signs of distress noted. Will continue to monitor.

## 2015-04-19 NOTE — ED Provider Notes (Signed)
[facility] Emergency Department Provider Note  ____________________________________________  Time seen: Approximately 2:54 AM  I have reviewed the triage vital signs and the nursing notes.   HISTORY  Chief Complaint Chest Pain    HPI Destiny Cole is a 64 y.o. female brought to the ED via EMS from SNF with complaints of lower abdominal pain and chest pain associated with nausea and dry heaves for the past 2 days. Patient had a recent admission at the end of August for mild DKA and frequent falls. He has been in the SNF since discharge. Reports she is currently taking Levaquin for "infection". Reports generalized weakness associated with nausea and dry heaves. Complains of lower abdominal pain. Denies fever, chills, shortness of breath, palpitations. Does complain of intermittent chest pain for the past 2 days. She is normally not on oxygen but states they placed her on oxygen yesterday at the SNF.   Past Medical History  Diagnosis Date  . Diabetes mellitus without complication   . Cerebral palsy   . Hypertension     Patient Active Problem List   Diagnosis Date Noted  . Pressure ulcer 04/06/2015  . Facial droop 04/06/2015  . Leukocytosis 04/06/2015  . Anxiety state 04/06/2015  . Failure to thrive in adult 04/05/2015  . DKA, type 2 04/05/2015  . Hyperlipidemia 04/05/2015  . Diabetic ketoacidosis without coma associated with diabetes mellitus due to underlying condition   . Physical deconditioning   . Acute respiratory depression with hypoxia - Resolved 10/30/2012  . Nausea, vomiting and fever - likely secondary to viral gastroenteritis 10/28/2012  . Diabetes mellitus without complication   . Cerebral palsy   . Hypertension     Past Surgical History  Procedure Laterality Date  . Abdominal hysterectomy    . Mouth surgery    . Cerbral palsy      Current Outpatient Rx  Name  Route  Sig  Dispense  Refill  . aspirin 81 MG chewable tablet    Oral   Chew 81 mg by mouth daily.         Marland Kitchen atorvastatin (LIPITOR) 40 MG tablet   Oral   Take 40 mg by mouth daily.         . busPIRone (BUSPAR) 7.5 MG tablet   Oral   Take 1 tablet (7.5 mg total) by mouth 2 (two) times daily.   30 tablet   0   . Cholecalciferol (VITAMIN D3 PO)   Oral   Take 1 tablet by mouth daily. 100 units/tablet         . FLUoxetine (PROZAC) 20 MG capsule   Oral   Take 1 capsule (20 mg total) by mouth daily.   30 capsule   0   . Inositol Niacinate (NO FLUSH NIACIN) 500 MG TABS   Oral   Take 1 tablet by mouth daily.         . insulin aspart (NOVOLOG) cartridge      Correction coverage: Sensitive (thin, NPO, renal)     CBG < 70: implement hypoglycemia protocol    CBG 70 - 120: 0 units    CBG 121 - 150: 1 unit    CBG 151 - 200: 2 units    CBG 201 - 250: 3 units    CBG 251 - 300: 5 units    CBG 301 - 350: 7 units    CBG 351 - 400 9 units    CBG > 400 call MD and obtain STAT  lab verification   15 mL   1   . insulin detemir (LEVEMIR) 100 UNIT/ML injection   Subcutaneous   Inject 36 Units into the skin at bedtime.         Marland Kitchen levofloxacin (LEVAQUIN) 500 MG tablet   Oral   Take 1 tablet (500 mg total) by mouth daily.   5 tablet   0   . Neo-Bacit-Poly-Lidocaine (FIRST AID PLUS LIDOCAINE EX)   Apply externally   Apply 1 application topically as needed (apply to broken skin areas).         . Omega-3 Fatty Acids (FISH OIL) 1200 MG CAPS   Oral   Take 1 capsule by mouth daily.         . ondansetron (ZOFRAN) 4 MG tablet   Oral   Take 1 tablet (4 mg total) by mouth every 8 (eight) hours as needed for nausea. Patient not taking: Reported on 04/02/2015   20 tablet   0   . pantoprazole (PROTONIX) 40 MG tablet   Oral   Take 1 tablet (40 mg total) by mouth daily. Patient not taking: Reported on 04/02/2015   30 tablet   0   . potassium chloride (K-DUR) 10 MEQ tablet   Oral   Take 1 tablet (10 mEq total) by mouth  daily. Patient not taking: Reported on 04/02/2015   30 tablet   0     Allergies Promethazine and Sulfa antibiotics  Family History  Problem Relation Age of Onset  . Breast cancer Mother   . Hypertension Mother   . Diabetes Mellitus II Maternal Aunt     Social History Social History  Substance Use Topics  . Smoking status: Never Smoker   . Smokeless tobacco: None  . Alcohol Use: No    Review of Systems Constitutional: No fever/chills Eyes: No visual changes. ENT: No sore throat. Cardiovascular: Denies chest pain. Respiratory: Denies shortness of breath. Gastrointestinal: Positive for abdominal pain.  Positive for nausea, no vomiting.  No diarrhea.  No constipation. Genitourinary: Negative for dysuria. Musculoskeletal: Negative for back pain. Skin: Negative for rash. Neurological: Negative for headaches, focal weakness or numbness.  10-point ROS otherwise negative.  ____________________________________________   PHYSICAL EXAM:  VITAL SIGNS: ED Triage Vitals  Enc Vitals Group     BP 04/19/15 0154 149/75 mmHg     Pulse Rate 04/19/15 0154 102     Resp 04/19/15 0154 22     Temp 04/19/15 0154 98.7 F (37.1 C)     Temp Source 04/19/15 0154 Oral     SpO2 04/19/15 0154 95 %     Weight 04/19/15 0158 210 lb (95.255 kg)     Height 04/19/15 0158 5\' 1"  (1.549 m)     Head Cir --      Peak Flow --      Pain Score 04/19/15 0159 7     Pain Loc --      Pain Edu? --      Excl. in GC? --     Constitutional: Alert and oriented. Chronically ill-appearing and in mild acute distress. Eyes: Conjunctivae are normal. PERRL. EOMI. Head: Atraumatic. Nose: No congestion/rhinnorhea. Mouth/Throat: Mucous membranes are moist.  Oropharynx non-erythematous. Neck: No stridor.   Cardiovascular: Tachycardic, regular rhythm. Grossly normal heart sounds.  Good peripheral circulation. Respiratory: Normal respiratory effort.  No retractions. Lungs diminished bibasilarly. Gastrointestinal:  Obese. Soft and mildly tender to palpation suprapubic area without rebound or guarding. No distention. No abdominal bruits. No CVA tenderness.  Musculoskeletal: No lower extremity tenderness nor edema.  No joint effusions. Neurologic:  Normal speech and language. No gross focal neurologic deficits are appreciated.  Skin:  Skin is warm, dry and intact. No rash noted. Psychiatric: Mood and affect are normal. Speech and behavior are normal.  ____________________________________________   LABS (all labs ordered are listed, but only abnormal results are displayed)  Labs Reviewed  CBC WITH DIFFERENTIAL/PLATELET - Abnormal; Notable for the following:    WBC 20.4 (*)    RBC 3.34 (*)    Hemoglobin 9.8 (*)    HCT 30.5 (*)    RDW 14.6 (*)    Platelets 739 (*)    Neutro Abs 16.2 (*)    Monocytes Absolute 1.7 (*)    All other components within normal limits  COMPREHENSIVE METABOLIC PANEL - Abnormal; Notable for the following:    Sodium 134 (*)    Chloride 96 (*)    Glucose, Bld 252 (*)    BUN 23 (*)    Calcium 8.3 (*)    Albumin 1.5 (*)    ALT 11 (*)    Alkaline Phosphatase 204 (*)    All other components within normal limits  LIPASE, BLOOD - Abnormal; Notable for the following:    Lipase 12 (*)    All other components within normal limits  TROPONIN I - Abnormal; Notable for the following:    Troponin I 0.05 (*)    All other components within normal limits  URINALYSIS COMPLETEWITH MICROSCOPIC ([facility] ONLY) - Abnormal; Notable for the following:    Color, Urine YELLOW (*)    APPearance HAZY (*)    Glucose, UA 50 (*)    Leukocytes, UA 1+ (*)    Squamous Epithelial / LPF 0-5 (*)    All other components within normal limits  URINE CULTURE  CULTURE, BLOOD (ROUTINE X 2)  CULTURE, BLOOD (ROUTINE X 2)   ____________________________________________  EKG  ED ECG REPORT I, SUNG,JADE J, the attending physician, personally viewed and interpreted this ECG.   Date: 04/19/2015  EKG Time:  0150  Rate: 106  Rhythm: sinus tachycardia  Axis: normal  Intervals:none  ST&T Change: nonspecific  ____________________________________________  RADIOLOGY  Portable chest x-ray (viewed by me, interpreted per Dr. Andria Meuse): Shallow inspiration. Cardiac enlargement with suggestion of perihilar infiltration or edema. ____________________________________________   PROCEDURES  Procedure(s) performed: None  Critical Care performed: No  ____________________________________________   INITIAL IMPRESSION / ASSESSMENT AND PLAN / ED COURSE  Pertinent labs & imaging results that were available during my care of the patient were reviewed by me and considered in my medical decision making (see chart for details).  64 year old female who presents from nursing facility for nausea and lower abdominal pain; currently on Levaquin. Laboratory and urinalysis results notable for leukocytosis with UTI. Will initiate IV antibiotics with Zosyn. Discussed with hospitalist for evaluation in the ED for admission. ____________________________________________   FINAL CLINICAL IMPRESSION(S) / ED DIAGNOSES  Final diagnoses:  UTI (lower urinary tract infection)  Leukocytosis  Sepsis, due to unspecified organism  Chest pain, unspecified chest pain type      Irean Hong, MD 04/19/15 [phone]

## 2015-04-19 NOTE — H&P (Signed)
Destiny Cole is an 64 y.o. female.   Chief Complaint: Chest pain HPI: Patient presents to the hospital from her rehabilitation facility following a recent admission for DKA and mechanical falls. Past medical history significant for cerebral palsy and diabetes mellitus type 2. Tonight the patient states that her chest has been hurting intermittently for 3 days. Her chest primarily hurts when nursing home staff is trying to move her or when she is ambulating by leaning on the handles of her walker. The patient also complains of abdominal pain. She states on her last admission doctors explained that she had a "pelvic infection". Notably the patient denies sexual activity. Laboratory evaluation in the emergency room tonight shows urinary tract infection. The patient admits to feeling chills as well as sweats. She admits to "dry heaves"for at least 2 days as well. The patient has been taking Levaquin for 2 days and had been on oxygen by nasal cannula at the rehabilitation facility although she denies cough or shortness of breath. Due to her myriad complaints and comorbidities emergency department staff called for admission.  Past Medical History  Diagnosis Date  . Diabetes mellitus without complication   . Cerebral palsy   . Hypertension     Past Surgical History  Procedure Laterality Date  . Abdominal hysterectomy    . Mouth surgery    . Cerbral palsy      Family History  Problem Relation Age of Onset  . Breast cancer Mother   . Hypertension Mother   . Diabetes Mellitus II Maternal Aunt    Social History:  reports that she has never smoked. She does not have any smokeless tobacco history on file. She reports that she does not drink alcohol or use illicit drugs.  Allergies:  Allergies  Allergen Reactions  . Promethazine     She has a possible respiratory reaction with this medication with RR 4 and hypoxia a few minutes after administration. Other respiratory depressants like benzos  should be used with caution.  . Sulfa Antibiotics Swelling and Rash    Prior to Admission medications   Medication Sig Start Date End Date Taking? Authorizing Provider  aspirin 81 MG chewable tablet Chew 81 mg by mouth daily.    Historical Provider, MD  atorvastatin (LIPITOR) 40 MG tablet Take 40 mg by mouth daily.    Historical Provider, MD  busPIRone (BUSPAR) 7.5 MG tablet Take 1 tablet (7.5 mg total) by mouth 2 (two) times daily. 04/08/15   Reyne Dumas, MD  Cholecalciferol (VITAMIN D3 PO) Take 1 tablet by mouth daily. 100 units/tablet    Historical Provider, MD  FLUoxetine (PROZAC) 20 MG capsule Take 1 capsule (20 mg total) by mouth daily. 04/08/15   Reyne Dumas, MD  Inositol Niacinate (NO FLUSH NIACIN) 500 MG TABS Take 1 tablet by mouth daily.    Historical Provider, MD  insulin aspart (NOVOLOG) cartridge Correction coverage: Sensitive (thin, NPO, renal)     CBG < 70: implement hypoglycemia protocol    CBG 70 - 120: 0 units    CBG 121 - 150: 1 unit    CBG 151 - 200: 2 units    CBG 201 - 250: 3 units    CBG 251 - 300: 5 units    CBG 301 - 350: 7 units    CBG 351 - 400 9 units    CBG > 400 call MD and obtain STAT lab verification 04/08/15   Reyne Dumas, MD  insulin detemir (LEVEMIR) 100 UNIT/ML injection  Inject 36 Units into the skin at bedtime.    Historical Provider, MD  levofloxacin (LEVAQUIN) 500 MG tablet Take 1 tablet (500 mg total) by mouth daily. 04/08/15   Reyne Dumas, MD  Neo-Bacit-Poly-Lidocaine (FIRST AID PLUS LIDOCAINE EX) Apply 1 application topically as needed (apply to broken skin areas).    Historical Provider, MD  Omega-3 Fatty Acids (FISH OIL) 1200 MG CAPS Take 1 capsule by mouth daily.    Historical Provider, MD  ondansetron (ZOFRAN) 4 MG tablet Take 1 tablet (4 mg total) by mouth every 8 (eight) hours as needed for nausea. Patient not taking: Reported on 04/02/2015 10/30/12   Jessee Avers, MD  pantoprazole (PROTONIX) 40 MG tablet Take 1 tablet (40  mg total) by mouth daily. Patient not taking: Reported on 04/02/2015 10/30/12   Jessee Avers, MD  potassium chloride (K-DUR) 10 MEQ tablet Take 1 tablet (10 mEq total) by mouth daily. Patient not taking: Reported on 04/02/2015 10/30/12   Jessee Avers, MD     Results for orders placed or performed during the hospital encounter of 04/19/15 (from the past 48 hour(s))  CBC with Differential     Status: Abnormal   Collection Time: 04/19/15  2:01 AM  Result Value Ref Range   WBC 20.4 (H) 3.6 - 11.0 K/uL   RBC 3.34 (L) 3.80 - 5.20 MIL/uL   Hemoglobin 9.8 (L) 12.0 - 16.0 g/dL   HCT 30.5 (L) 35.0 - 47.0 %   MCV 91.3 80.0 - 100.0 fL   MCH 29.5 26.0 - 34.0 pg   MCHC 32.3 32.0 - 36.0 g/dL   RDW 14.6 (H) 11.5 - 14.5 %   Platelets 739 (H) 150 - 440 K/uL   Neutrophils Relative % 80 %   Neutro Abs 16.2 (H) 1.4 - 6.5 K/uL   Lymphocytes Relative 11 %   Lymphs Abs 2.3 1.0 - 3.6 K/uL   Monocytes Relative 8 %   Monocytes Absolute 1.7 (H) 0.2 - 0.9 K/uL   Eosinophils Relative 1 %   Eosinophils Absolute 0.2 0 - 0.7 K/uL   Basophils Relative 0 %   Basophils Absolute 0.1 0 - 0.1 K/uL  Comprehensive metabolic panel     Status: Abnormal   Collection Time: 04/19/15  2:01 AM  Result Value Ref Range   Sodium 134 (L) 135 - 145 mmol/L   Potassium 4.1 3.5 - 5.1 mmol/L   Chloride 96 (L) 101 - 111 mmol/L   CO2 30 22 - 32 mmol/L   Glucose, Bld 252 (H) 65 - 99 mg/dL   BUN 23 (H) 6 - 20 mg/dL   Creatinine, Ser 0.49 0.44 - 1.00 mg/dL   Calcium 8.3 (L) 8.9 - 10.3 mg/dL   Total Protein 7.1 6.5 - 8.1 g/dL   Albumin 1.5 (L) 3.5 - 5.0 g/dL   AST 19 15 - 41 U/L   ALT 11 (L) 14 - 54 U/L   Alkaline Phosphatase 204 (H) 38 - 126 U/L   Total Bilirubin 0.3 0.3 - 1.2 mg/dL   GFR calc non Af Amer >60 >60 mL/min   GFR calc Af Amer >60 >60 mL/min    Comment: (NOTE) The eGFR has been calculated using the CKD EPI equation. This calculation has not been validated in all clinical situations. eGFR's persistently <60  mL/min signify possible Chronic Kidney Disease.    Anion gap 8 5 - 15  Lipase, blood     Status: Abnormal   Collection Time: 04/19/15  2:01 AM  Result  Value Ref Range   Lipase 12 (L) 22 - 51 U/L  Troponin I     Status: Abnormal   Collection Time: 04/19/15  2:01 AM  Result Value Ref Range   Troponin I 0.05 (H) <0.031 ng/mL    Comment: READ BACK AND VERIFIED WITH [facility] YUAL AT 0236 04/19/15.PMH        PERSISTENTLY INCREASED TROPONIN VALUES IN THE RANGE OF 0.04-0.49 ng/mL CAN BE SEEN IN:       -UNSTABLE ANGINA       -CONGESTIVE HEART FAILURE       -MYOCARDITIS       -CHEST TRAUMA       -ARRYHTHMIAS       -LATE PRESENTING MYOCARDIAL INFARCTION       -COPD   CLINICAL FOLLOW-UP RECOMMENDED.   Urinalysis complete, with microscopic ([facility] only)     Status: Abnormal   Collection Time: 04/19/15  2:01 AM  Result Value Ref Range   Color, Urine YELLOW (A) YELLOW   APPearance HAZY (A) CLEAR   Glucose, UA 50 (A) NEGATIVE mg/dL   Bilirubin Urine NEGATIVE NEGATIVE   Ketones, ur NEGATIVE NEGATIVE mg/dL   Specific Gravity, Urine 1.019 1.005 - 1.030   Hgb urine dipstick NEGATIVE NEGATIVE   pH 5.0 5.0 - 8.0   Protein, ur NEGATIVE NEGATIVE mg/dL   Nitrite NEGATIVE NEGATIVE   Leukocytes, UA 1+ (A) NEGATIVE   RBC / HPF 6-30 0 - 5 RBC/hpf   WBC, UA TOO NUMEROUS TO COUNT 0 - 5 WBC/hpf   Bacteria, UA NONE SEEN NONE SEEN   Squamous Epithelial / LPF 0-5 (A) NONE SEEN   Mucous PRESENT    Budding Yeast PRESENT    Dg Chest Port 1 View  04/19/2015   CLINICAL DATA:  Chest pain and left-sided abdominal pain since July 27th. Worse tonight.  EXAM: PORTABLE CHEST - 1 VIEW  COMPARISON:  04/05/2015  FINDINGS: Shallow inspiration. Cardiac enlargement with mild perihilar infiltration suggesting edema. No blunting of costophrenic angles. No pneumothorax. Increased density over the lung bases is probably due to soft tissue attenuation.  IMPRESSION: Shallow inspiration. Cardiac enlargement with suggestion of  perihilar infiltration or edema.   Electronically Signed   By: Lucienne Capers M.D.   On: 04/19/2015 03:10    Review of Systems  Constitutional: Positive for chills and diaphoresis. Negative for fever.  HENT: Negative for sore throat and tinnitus.   Eyes: Negative for blurred vision and redness.  Respiratory: Negative for cough and shortness of breath.   Cardiovascular: Positive for chest pain (with movement). Negative for palpitations, orthopnea and PND.  Gastrointestinal: Positive for nausea and abdominal pain. Negative for vomiting and diarrhea.  Genitourinary: Negative for dysuria, urgency and frequency.  Musculoskeletal: Negative for myalgias and joint pain.  Skin: Negative for rash.       No lesions  Neurological: Negative for speech change, focal weakness and weakness.  Endo/Heme/Allergies: Does not bruise/bleed easily.       No temperature intolerance  Psychiatric/Behavioral: Negative for depression and suicidal ideas.    Blood pressure 139/67, pulse 95, temperature 98.7 F (37.1 C), temperature source Oral, resp. rate 21, height 5' 1"  (1.549 m), weight 95.255 kg (210 lb), SpO2 94 %. Physical Exam  Vitals reviewed. Constitutional: She is oriented to person, place, and time. She appears well-developed and well-nourished.  HENT:  Head: Normocephalic and atraumatic.  Mouth/Throat: Oropharynx is clear and moist.  Poor dentition  Eyes: Conjunctivae and EOM are normal. Pupils are equal,  round, and reactive to light. No scleral icterus.  Neck: Normal range of motion. Neck supple. No JVD present. No tracheal deviation present. No thyromegaly present.  Cardiovascular: Regular rhythm and normal heart sounds.  Tachycardia present.  Exam reveals no gallop and no friction rub.   No murmur heard. Respiratory: Effort normal and breath sounds normal.  GI: Soft. Bowel sounds are normal. She exhibits no distension. There is no tenderness.  Genitourinary:  Deferred  Musculoskeletal: She  exhibits edema.  Weakness of the right upper and lower extremity  Lymphadenopathy:    She has no cervical adenopathy.  Neurological: She is alert and oriented to person, place, and time. No cranial nerve deficit. She exhibits normal muscle tone.  Skin: Skin is warm and dry. No rash noted. No erythema.  Psychiatric: She has a normal mood and affect. Her behavior is normal. Judgment and thought content normal.     Assessment/Plan This is a 64 year old Caucasian female admitted for sepsis and urinary tract infection. 1. Sepsis: Patient meets criteria via cytosis, tachycardia and tachypnea. Blood cultures and urine cultures obtained in the emergency department. She is received 1 dose of IV Zosyn. She is hemodynamically stable. Continue IV antibiotics until sepsis syndrome has resolved. Follow cultures for growth and sensitivities. 2. Urinary tract infection: Likely source of sepsis although the patient admits that she has some skin breakdown on her buttocks. I have not ruled the patient on my own but we will examine her backside when we transfer her to the floor and apply appropriate barrier and pressure dressings. 3. Chest pain: Reproducible. Upon his elevated but most likely secondary to sepsis. Continue to follow cardiac biomarkers. Continue aspirin 4. Diabetes mellitus type 2: Continue basal insulin dosing adjusted for hospital diet. Place patient on sliding scale insulin as well. 5. Hyperlipidemia: Continue statin therapy 6. Depression: Continue buspirone and fluoxetine 7. DVT prophylaxis: Heparin 8. GI prophylaxis: Pantoprazole The patient is a DO NOT RESUSCITATE. Time spent on admission orders and patient care approximately 35 minutes  Harrie Foreman 04/19/2015, 3:55 AM

## 2015-04-19 NOTE — Clinical Social Work Note (Signed)
Clinical Social Work Assessment  Patient Details  Name: Destiny Cole MRN: [id number] Date of Birth: 08/20/1950  Date of referral:  04/19/15               Reason for consult:  Facility Placement                Permission sought to share information with:  Family Supports Permission granted to share information::  No  Name::      (Patient does not want CSW to contact her daughter: Destiny Cole)  Agency::     Relationship::     Contact Information:     Housing/Transportation Living arrangements for the past 2 months:   (past two weeks has been at [facility]) Source of Information:  Patient Patient Interpreter Needed:  None Criminal Activity/Legal Involvement Pertinent to Current Situation/Hospitalization:  No - Comment as needed Significant Relationships:  Adult Children Lives with:  Facility Resident Do you feel safe going back to the place where you live?  Yes Need for family participation in patient care:  No (Coment)  Care giving concerns: Patient resides at [place] / plan:  CSW consulted as patient has been a resident at [facility] for several weeks. CSW met with patient and she stated that she has been at [facility] for a few weeks and it was suppose to be for rehab. Patient is medicaid only. Patient began tearing up when asked if she had any family or friends in the area and when asked why she was crying she would not say. She did respond with a nod when asked if she and her daughter: Destiny Cole have a strained relationship. Patient would not elaborate on this at this time. Patient did state that she did not wish for CSW to contact her at this time.   Patient stated she would return to [facility] at discharge. CSW has sent message to [facility] to determine if they can take patient back when time. Patient was a medicaid placement from [employer] a couple of weeks ago.   Employment status:  Disabled (Comment on whether or not currently receiving Disability) Insurance  information:  Medicaid In [place] PT Recommendations:    Information / Referral to community resources:     Patient/Family's Response to care:  Patient is tearful but able to express appreciation for CSW assistance.  Patient/Family's Understanding of and Emotional Response to Diagnosis, Current Treatment, and Prognosis:  Patient verbalized understanding of current situation and stated she is hopeful she will be able to discharge tomorrow.  Emotional Assessment Appearance:  Appears stated age Attitude/Demeanor/Rapport:  Crying (cooperative) Affect (typically observed):  Tearful/Crying Orientation:  Oriented to Self, Oriented to Place, Oriented to  Time, Oriented to Situation Alcohol / Substance use:  Not Applicable Psych involvement (Current and /or in the community):  No (Comment)  Discharge Needs  Concerns to be addressed:  Adjustment to Illness Readmission within the last 30 days:  No Current discharge risk:  None Barriers to Discharge:  No Barriers Identified   Shela Leff, LCSW 04/19/2015, 3:08 PM

## 2015-04-19 NOTE — Progress Notes (Signed)
Sepsis with UTI C/o nausea- Zofran ordered.  Have decubetus ulcer and left hip and pelvis pain- ordered CT and PT eval.

## 2015-04-19 NOTE — ED Notes (Signed)
Pt presents to ED via EMS from SNF with c/o chest pain/left side abdominal pain since March 04, 2015, worse tonight with with dry heaves. Pt alerts and oriented x4 at this time. Per EMS, HR-105

## 2015-04-20 ENCOUNTER — Inpatient Hospital Stay: Payer: Medicaid Other

## 2015-04-20 DIAGNOSIS — S301XXA Contusion of abdominal wall, initial encounter: Secondary | ICD-10-CM

## 2015-04-20 LAB — GLUCOSE, CAPILLARY
Glucose-Capillary: 158 mg/dL — ABNORMAL HIGH (ref 65–99)
Glucose-Capillary: 200 mg/dL — ABNORMAL HIGH (ref 65–99)
Glucose-Capillary: 289 mg/dL — ABNORMAL HIGH (ref 65–99)
Glucose-Capillary: 281 mg/dL — ABNORMAL HIGH (ref 65–99)

## 2015-04-20 LAB — BASIC METABOLIC PANEL
ANION GAP: 8 (ref 5–15)
BUN: 22 mg/dL — AB (ref 6–20)
CALCIUM: 8.1 mg/dL — AB (ref 8.9–10.3)
CO2: 30 mmol/L (ref 22–32)
Chloride: 100 mmol/L — ABNORMAL LOW (ref 101–111)
Creatinine, Ser: 0.48 mg/dL (ref 0.44–1.00)
GFR calc Af Amer: >60 mL/min (ref >60)
GLUCOSE: 183 mg/dL — AB (ref 65–99)
Potassium: 4 mmol/L (ref 3.5–5.1)
Sodium: 138 mmol/L (ref 135–145)

## 2015-04-20 LAB — CBC
HCT: 28.3 % — ABNORMAL LOW (ref 35.0–47.0)
Hemoglobin: 9 g/dL — ABNORMAL LOW (ref 12.0–16.0)
MCH: 29.7 pg (ref 26.0–34.0)
MCHC: 31.8 g/dL — ABNORMAL LOW (ref 32.0–36.0)
MCV: 93.2 fL (ref 80.0–100.0)
PLATELETS: 605 10*3/uL — AB (ref 150–440)
RBC: 3.04 MIL/uL — ABNORMAL LOW (ref 3.80–5.20)
RDW: 14.5 % (ref 11.5–14.5)
WBC: 19.3 10*3/uL — AB (ref 3.6–11.0)

## 2015-04-20 LAB — URINE CULTURE: Special Requests: NORMAL

## 2015-04-20 LAB — RAPID HIV SCREEN (HIV 1/2 AB+AG)
HIV 1/2 ANTIBODIES: NONREACTIVE
HIV-1 P24 Antigen - HIV24: NONREACTIVE
INTERPRETATION (HIV AG AB): NONREACTIVE

## 2015-04-20 MED ORDER — IOHEXOL 300 MG/ML  SOLN
100.0000 mL | Freq: Once | INTRAMUSCULAR | Status: AC | PRN
  Administered 2015-04-20: 100 mL via INTRAVENOUS

## 2015-04-20 MED ORDER — OXYCODONE-ACETAMINOPHEN 5-325 MG PO TABS
1.0000 | ORAL_TABLET | Freq: Four times a day (QID) | ORAL | Status: DC | PRN
  Administered 2015-04-21 – 2015-04-24 (×5): 1 via ORAL
  Filled 2015-04-20 (×6): qty 1

## 2015-04-20 MED ORDER — IOHEXOL 240 MG/ML SOLN
25.0000 mL | INTRAMUSCULAR | Status: AC
  Administered 2015-04-20 (×2): 25 mL via INTRAVENOUS

## 2015-04-20 MED ORDER — PRO-STAT SUGAR FREE PO LIQD
30.0000 mL | Freq: Two times a day (BID) | ORAL | Status: DC
  Administered 2015-04-21 – 2015-04-24 (×6): 30 mL via ORAL

## 2015-04-20 NOTE — Plan of Care (Signed)
Problem: Discharge Progression Outcomes Goal: Hemodynamically stable Outcome: Progressing Plan of Care Progress to Goal:  Pt is afebrile, but WBC elevated at 19.3.  Other VSS. Goal: Complications resolved/controlled Outcome: Not Progressing Pt's CT w/out contrast showed fluid collection/mass retroperitoneal area.  Having CT w/contrast today to get a better picture.  Had Infectious disease consult and surgical consult today.  Pt c/o LLQ pain radiating to back - relieved w/morphine.  Infectious disease dr has ordered rapid HIV screen and quantiferon tb gold assay. PT got her up to chair but she couldn't stay there full hour.  Goal: Tolerates diet Outcome: Progressing Pt is tolerating diet.  Goal: Activity appropriate for discharge plan Outcome: Progressing Pt worked w/PT today.

## 2015-04-20 NOTE — Progress Notes (Signed)
[facility] - [place] at [facility]   PATIENT NAME: Destiny Cole    MR#:  [id number]  DATE OF BIRTH:  September 19, 1950  SUBJECTIVE:  CHIEF COMPLAINT:   Chief Complaint  Patient presents with  . Chest Pain    Came with sepsis, have complain of pain in left hip. No new complains.  REVIEW OF SYSTEMS:  CONSTITUTIONAL: No fever, have generalized fatigue or weakness.  EYES: No blurred or double vision.  EARS, NOSE, AND THROAT: No tinnitus or ear pain.  RESPIRATORY: No cough, shortness of breath, wheezing or hemoptysis.  CARDIOVASCULAR: No chest pain, orthopnea, edema.  GASTROINTESTINAL: No nausea, vomiting, diarrhea or abdominal pain.  GENITOURINARY: No dysuria, hematuria.  ENDOCRINE: No polyuria, nocturia,  HEMATOLOGY: No anemia, easy bruising or bleeding SKIN: No rash or lesion. MUSCULOSKELETAL: left hip joint pain or arthritis.   NEUROLOGIC: No tingling, numbness, weakness.  PSYCHIATRY: No anxiety or depression.   ROS  DRUG ALLERGIES:   Allergies  Allergen Reactions  . Promethazine     She has a possible respiratory reaction with this medication with RR 4 and hypoxia a few minutes after administration. Other respiratory depressants like benzos should be used with caution.  . Sulfa Antibiotics Swelling and Rash    VITALS:  Blood pressure 132/79, pulse 102, temperature 98 F (36.7 C), temperature source Oral, resp. rate 18, height 5\' 6"  (1.676 m), weight 96.344 kg (212 lb 6.4 oz), SpO2 91 %.  PHYSICAL EXAMINATION:  GENERAL:  64 y.o.-year-old patient lying in the bed with no acute distress.  EYES: Pupils equal, round, reactive to light and accommodation. No scleral icterus. Extraocular muscles intact.  HEENT: Head atraumatic, normocephalic. Oropharynx and nasopharynx clear.  NECK:  Supple, no jugular venous distention. No thyroid enlargement, no tenderness.  LUNGS: Normal breath sounds bilaterally, no wheezing, rales,rhonchi or crepitation. No use of  accessory muscles of respiration.  CARDIOVASCULAR: S1, S2 normal. No murmurs, rubs, or gallops.  ABDOMEN: Soft, nontender, nondistended. Bowel sounds present. No organomegaly or mass. Tender on left side of hip and lower back. EXTREMITIES: No pedal edema, cyanosis, or clubbing.  NEUROLOGIC: Cranial nerves II through XII are intact. Muscle strength 4/5 in left side extremities, right side extremities have weakness and contracture . Sensation intact. Gait not checked.  PSYCHIATRIC: The patient is alert and oriented x 3.  SKIN: No obvious rash, lesion, or ulcer.   Physical Exam LABORATORY PANEL:   CBC  Recent Labs Lab 04/20/15 0429  WBC 19.3*  HGB 9.0*  HCT 28.3*  PLT 605*   ------------------------------------------------------------------------------------------------------------------  Chemistries   Recent Labs Lab 04/16/15 0450  04/19/15 0201 04/20/15 0429  NA 137  < > 134* 138  K 3.5  < > 4.1 4.0  CL 100*  < > 96* 100*  CO2 30  < > 30 30  GLUCOSE 214*  < > 252* 183*  BUN 23*  < > 23* 22*  CREATININE 0.42*  < > 0.49 0.48  CALCIUM 8.2*  < > 8.3* 8.1*  MG 1.6*  --   --   --   AST 18  < > 19  --   ALT 10*  < > 11*  --   ALKPHOS 183*  < > 204*  --   BILITOT 0.2*  < > 0.3  --   < > = values in this interval not displayed. ------------------------------------------------------------------------------------------------------------------  Cardiac Enzymes  Recent Labs Lab 04/19/15 1103 04/19/15 1703  TROPONINI 0.03 0.03   ------------------------------------------------------------------------------------------------------------------  RADIOLOGY:  Ct Pelvis Wo Contrast  04/19/2015   CLINICAL DATA:  64 year old female with a history of left hip and pelvic pain since July. History of 2 falls.  EXAM: CT PELVIS WITHOUT CONTRAST  TECHNIQUE: Multidetector CT imaging of the pelvis was performed following the standard protocol without intravenous contrast.  COMPARISON:   Plain film 04/05/2015, 04/01/2015  FINDINGS: Musculoskeletal:  Mild degenerative disc disease. No evidence of bony narrowing at the L4-S1 level.  No sacral fracture identified.  Bony pelvis intact without acute fracture. Mild degenerative changes at the bilateral sacroiliac joints.  Unremarkable appearance of the right hip, which is congruent. Minimal joint space narrowing. No subchondral sclerosis or cyst formation. Unremarkable appearance of the proximal right femur.  No evidence of left hip fracture. Unremarkable appearance of the proximal left femur. Mild joint space narrowing without subchondral sclerosis or cyst formation. Left hip is congruent.  Ill-defined intermediate density soft tissue involving the abdominal wall musculature superior to the left iliac bone. Soft tissue dimension measures 9.1 cm by 10.0 cm on axial imaging. There is disruption of the normal architecture of the abdominal wall musculature at this site. Soft tissue extends into the retroperitoneum with ill-defined margins of the left psoas muscle and retroperitoneal fat. Superior extent not entirely imaged.  Intra-abdominal/pelvic:  Unremarkable appearance of visualized bowel.  Atherosclerotic calcifications of the vasculature.  Unremarkable appearance of the urinary bladder.  Surgical changes of hysterectomy.  IMPRESSION: No acute fracture identified, specifically involving the left hip.  Mild osteoarthritis of the bilateral hips.  Abnormal soft tissue disrupting the posterior abdominal wall musculature extending caudally from the left iliac bone, incompletely imaged. Differential diagnosis includes hematoma, or alternatively soft tissue mass there also appears to be involvement of the left psoas musculature and retroperitoneum, again, incompletely imaged. Further evaluation with a contrast-enhanced abdomen/pelvis CT is recommend.  Signed,  Yvone Neu. Loreta Ave, DO  [facility]  [facility]    Electronically Signed   By: Gilmer Mor D.O.   On: 04/19/2015 10:47   Dg Chest Port 1 View  04/19/2015   CLINICAL DATA:  Chest pain and left-sided abdominal pain since July 27th. Worse tonight.  EXAM: PORTABLE CHEST - 1 VIEW  COMPARISON:  04/05/2015  FINDINGS: Shallow inspiration. Cardiac enlargement with mild perihilar infiltration suggesting edema. No blunting of costophrenic angles. No pneumothorax. Increased density over the lung bases is probably due to soft tissue attenuation.  IMPRESSION: Shallow inspiration. Cardiac enlargement with suggestion of perihilar infiltration or edema.   Electronically Signed   By: Burman Nieves M.D.   On: 04/19/2015 03:10    ASSESSMENT AND PLAN:   1. Sepsis:   Blood cultures and urine cultures obtained in the emergency department. She is received 1 dose of IV Zosyn. She is hemodynamically stable. on rocephin.  2. Urinary tract infection: Likely source of sepsis, she is also having a sacral decubetus ulcer and c/o pain in lower back- CT showed some fluid collection/ mass.  called Surgery consult to help. And ID.  Will get CT with contrast today to have better idea.  i poke to pt's daughter- Ms.Dillard to update her.  3. Chest pain: Reproducible. Upon his elevated but most likely secondary to sepsis. Continue to follow cardiac biomarkers. Continue aspirin  4. Diabetes mellitus type 2: Continue basal insulin dosing adjusted for hospital diet. Place patient on sliding scale insulin as well. 5. Hyperlipidemia: Continue statin therapy. 6. Depression: Continue buspirone and fluoxetine 7. DVT prophylaxis: Heparin 8. GI prophylaxis: Pantoprazole  All the records are reviewed and case discussed with Care Management/Social Workerr. Management plans discussed with the patient, family and they are in agreement.  CODE STATUS: DNR  TOTAL TIME TAKING CARE OF THIS PATIENT: 45 minutes.  >50% of time spent in co-ordination of care.  Discussed with Dr. Michela Pitcher, Dr.  Sampson Goon, pt's daughter on phone,.  POSSIBLE D/C IN 2-3 DAYS, DEPENDING ON CLINICAL CONDITION.   Altamese Dilling M.D on 04/20/2015   Between 7am to 6pm - Pager - [phone]  After 6pm go to [email or web address] - password [email or web address] [facility]  [facility]  Office  [phone]  CC: Primary care physician; Egbert Garibaldi, NP

## 2015-04-20 NOTE — Evaluation (Signed)
Physical Therapy Evaluation Patient Details Name: Destiny Cole MRN: [id number] DOB: [birth year] Today's Date: 04/20/2015   History of Present Illness  Pt is a 64 y.o. female with h/o DM type 2, cerebral palsy with R sided weakness, HTN, and HLD presenting to hospital from STR with chest pain and abdominal pain.  Pt admitted with sepsis and UTI and found to have retroperitoneal collection on imaging.  CT pelvis negative for fracture L hip (positive for mild OA B hips).  Pt with recent hospital admission d/t DKA and mechanical falls (been at [facility] since).  Clinical Impression  Currently pt demonstrates impairments with strength, balance, pain, and limitations with functional mobility.  Prior to recent hospital admissions and STR stay (pt was receiving PT M-F 1x a day at [facility]), pt was independent with walker and living at [facility] in [place].  Currently pt is max assist supine to sit and requires 2 assist for safe ambulation a few feet bed to recliner with RW.  Pt would benefit from skilled PT to address above noted impairments and functional limitations.  Recommend pt discharge to STR when medically appropriate.     Follow Up Recommendations SNF    Equipment Recommendations       Recommendations for Other Services       Precautions / Restrictions Precautions Precautions: Fall Precaution Comments: Hx CP and R side weakness Restrictions Weight Bearing Restrictions: No      Mobility  Bed Mobility Overal bed mobility: Needs Assistance Bed Mobility: Supine to Sit     Supine to sit: Mod assist;Max assist;HOB elevated     General bed mobility comments: assist for trunk and R LE required  Transfers Overall transfer level: Needs assistance Equipment used: Rolling walker (2 wheeled) Transfers: Sit to/from Stand Sit to Stand: Mod assist;+2 physical assistance         General transfer comment: 1st trial standing pt unable to stand fully upright with max assist  x1; 2nd attempt pt able to stand upright fully with 2 assist  Ambulation/Gait Ambulation/Gait assistance: Min assist;Mod assist;+2 physical assistance Ambulation Distance (Feet): 3 Feet (bed to recliner) Assistive device: Rolling walker (2 wheeled)   Gait velocity: decreased   General Gait Details: decreased stance time R LE; decreased B step length/foot clearance/heelstrike  Stairs            Wheelchair Mobility    Modified Rankin (Stroke Patients Only)       Balance Overall balance assessment: Needs assistance;History of Falls Sitting-balance support: Bilateral upper extremity supported;Feet supported Sitting balance-Leahy Scale: Fair     Standing balance support: Bilateral upper extremity supported (on RW) Standing balance-Leahy Scale: Fair                               Pertinent Vitals/Pain Pain Assessment: 0-10 Pain Score: 8  (minimal at rest) Pain Location: low back Pain Descriptors / Indicators: Sharp;Shooting (intermittent with mobility) Pain Intervention(s): Limited activity within patient's tolerance;Monitored during session;Repositioned  Pt O2 88% on room air upon sitting in recliner but increased to 96% on room air with vc's for purse lip breathing technique (nursing notified of this and to monitor pt; also notified nursing pt's daughter asking for incentive spirometer for pt). Pt's HR in 90's during session.    Home Living Family/patient expects to be discharged to:: Skilled nursing facility Living Arrangements: Alone   Type of Home: Apartment       Home Layout:  One level Home Equipment: Walker - 4 wheels Additional Comments: pt resides at [facility] (has not been there since end of July)    Prior Function Level of Independence: Needs assistance   Gait / Transfers Assistance Needed: ambulates with walker  ADL's / Homemaking Assistance Needed: sponge bathing        Hand Dominance        Extremity/Trunk Assessment   Upper  Extremity Assessment: RUE deficits/detail;LUE deficits/detail RUE Deficits / Details: baseline deficits d/t  CP (R elbow flexion/extension and shoulder flexion grossly 2/5), diffuse muscle atrophy     LUE Deficits / Details: grossly 3-/5 with elbow flexion/extension and shoulder flexion   Lower Extremity Assessment: RLE deficits/detail;LLE deficits/detail RLE Deficits / Details: grossly 2 to 2+/5 knee flexion/extension; hip flexion 3-/5; ankle df 0/5 and pt reports this is her baseline LLE Deficits / Details: L LE hip flexion, knee flexion/extension, and DF at least 3+/5     Communication   Communication: No difficulties  Cognition Arousal/Alertness: Awake/alert Behavior During Therapy: Anxious Overall Cognitive Status: Within Functional Limits for tasks assessed                      General Comments   Nursing cleared pt for participation in physical therapy.  Pt agreeable to PT session after nursing encouragement.    Exercises        Assessment/Plan    PT Assessment Patient needs continued PT services  PT Diagnosis Generalized weakness   PT Problem List Decreased strength;Decreased activity tolerance;Decreased balance;Decreased mobility;Pain;Decreased range of motion  PT Treatment Interventions DME instruction;Gait training;Functional mobility training;Therapeutic activities;Therapeutic exercise;Balance training;Patient/family education   PT Goals (Current goals can be found in the Care Plan section) Acute Rehab PT Goals Patient Stated Goal: to have less pain and get stronger PT Goal Formulation: With patient Time For Goal Achievement: 05/04/15 Potential to Achieve Goals: Fair    Frequency Min 2X/week   Barriers to discharge Decreased caregiver support      Co-evaluation               End of Session Equipment Utilized During Treatment: Gait belt Activity Tolerance: Patient limited by fatigue;Patient limited by pain Patient left: in chair;with call  bell/phone within reach;with chair alarm set;with family/visitor present Nurse Communication: Mobility status;Precautions (2 assist for transfer back to bed)         Time: 1308-6578 PT Time Calculation (min) (ACUTE ONLY): 35 min   Charges:   PT Evaluation $Initial PT Evaluation Tier I: 1 Procedure     PT G CodesHendricks Limes 2015/05/06, 10:27 AM [name], PT [phone]

## 2015-04-20 NOTE — Progress Notes (Addendum)
Patient discharged from [facility] on 04/08/15 to [facility] on a 30 day LOG. Per Kim admissions coordinator at [facility] they can accept patient back if [facility] can continue the current LOG or do a new 30 day LOG. Per Maudie Mercury patient has community Medicaid and they are working on switching it to [employer]. Kim reported that Heron Nay prefers a new 30 day LOG. CSW contacted Publishing rights manager clinical social work [employer] to request an [place]. Per Edwyna Ready he will approve a new 30 day LOG. Patient can return to [facility] when medically stable. CSW will continue to follow and assist as needed.   Patient's daughter [name] met with CSW and asked about advanced directives. CSW told daughter that chaplain services can assist with an advanced directive. Daughter is agreeable for patient to return to [facility]. Daughter and Heron Nay are working on changing patient's Medicaid from community to long term care.   Blima Rich, [place] [phone]

## 2015-04-20 NOTE — Progress Notes (Signed)
Destiny Cole is a 64 y.o. female  admitted to the medical service after multiple falls with a possible urinary tract infection and sepsis.  HPI: She was recently admitted to the medical service with diabetes which appears to be in poor control. She had possible urinary tract infection. She's had multiple falls recently. She's been cared for in a rehabilitation facility.  She's been complaining of significant left back and hip pain. Workup with plain film was unremarkable. CT scan did demonstrate what appeared to be a large hematoma or neoplastic mass in that area in both the abdominal wall and in the psoas muscle. It appeared be some deformity in that area on CT scan. She complains of persistent pain in that part of her body particularly with any sort of motion. The surgical service was consulted for further evaluation of this CT finding.  Past Medical History  Diagnosis Date  . Diabetes mellitus without complication   . Cerebral palsy   . Hypertension    Past Surgical History  Procedure Laterality Date  . Abdominal hysterectomy    . Mouth surgery    . Cerbral palsy     Social History   Social History  . Marital Status: Legally Separated    Spouse Name: N/A  . Number of Children: N/A  . Years of Education: N/A   Social History Main Topics  . Smoking status: Never Smoker   . Smokeless tobacco: None  . Alcohol Use: No  . Drug Use: No  . Sexual Activity: No   Other Topics Concern  . None   Social History Narrative    Review of Systems: ROS the medical workup was reviewed. The patient is really not cooperative enough at this point to provide an adequate review of systems.  PHYSICAL EXAM: BP 132/79 mmHg  Pulse 102  Temp(Src) 98 F (36.7 C) (Oral)  Resp 18  Ht 5\' 6"  (1.676 m)  Wt 212 lb 6.4 oz (96.344 kg)  BMI 34.30 kg/m2  SpO2 91%  Physical Exam  Constitutional: She appears well-developed and well-nourished. No distress.  HENT:  Head: Normocephalic and  atraumatic.  Eyes: EOM are normal. Pupils are equal, round, and reactive to light.  Neck: Normal range of motion. Neck supple.  Cardiovascular: Regular rhythm and normal heart sounds.   No murmur heard. Pulmonary/Chest: Effort normal and breath sounds normal.  Abdominal: Soft. Bowel sounds are normal.  Musculoskeletal: Normal range of motion. She exhibits tenderness.  Significant left back tenderness  Neurological:  Lethargic not well oriented  Skin: Skin is warm and dry.  Psychiatric:  Her affect was flat and lethargic. Her orientation was difficult to assess.   Her abdomen has some fullness left lower quadrant which could be a related to this abnormality and retroperitoneum. Otherwise her abdomen is unremarkable with minimal tenderness and no significant guarding.  She does have a large decubitus on her back and sacrum.  Impression/Plan: I think that reviewed her CT scan and did review the CT scan with radiologist. This abnormality could simply be a hematoma from her multiple falls. Her hemoglobin is down to 9. However, she denies any direct fall on her back and there does appear to be involvement of the abdominal wall and her psoas muscle which would be an unusual configuration for simple hematoma.  There is no gas in the area to suggest an infection from her decubitus. This abnormality does not appear to be an abscess.  In reviewing the options with radiologist there is significant  concern for possible neoplastic process. With her pain she certainly could have some soft tissue tumor in this area. Radiology recommended a contrasted abdominal and pelvic CT scan for better evaluation. This plan was discussed with the internal medicine service. We will continue to follow her.   Tiney Rouge III, MD  04/20/2015, 2:04 PM

## 2015-04-20 NOTE — Progress Notes (Signed)
°   04/20/15 1100  Clinical Encounter Type  Visited With Patient and family together (Provided Advance Dir. Edu. )  Visit Type Initial  Referral From Nurse  Spiritual Encounters  Spiritual Needs (Understanding  )  Advance Directives (For Healthcare)  Does patient have an advance directive? (Patient in process of filing out Adv. Dir.)

## 2015-04-20 NOTE — Progress Notes (Signed)
Noted consult for A1C of 14.8% and dietary education. Spoke with patient and her daughters about diabetes and outpatient regimen for diabetes control. Patient reports that she is living in a Rehab facility at this time and they are monitoring her glucose, administering insulin, and providing controlled carb modified diet. Per Consuella Lose (patient's daughter who is a LPN) depending on how patient does at Rehab will determine if patient will be allowed to live on her own again or if she will go into an ALF. During conversation with patient, her daughter Consuella Lose bluntly answered many questions I asked the patient. Discussed A1C results (14.8% on 04/19/2015) and explained that her current A1C was equivalent to an average glucose of 378 mg/dl. Consuella Lose reported that patient's previous A1C was 16.6% on 04/05/2015; therefore the A1C was improved. Patient states that since she is at the Rehab facility her diabetes is being better managed. She states that some days she does not feel like eating 3 meals a day and that she has been told that she must eat before her physical therapy. Inquired about meals and patient states that her meals are controlled but she is allowed some options but she has to follow a Carb Modified diet. Discussed carbohydrates, carbohydrate goals per day and meal, along with portion sizes. Patient reports that she does not drink sugary fluids and does not really eat desserts very often. Patient's daughters state that patient will ask them to bring her 2 or 3 cheeseburgers sometimes. However, patient states that she has asked for the cheeseburgers without any bread. Reviewed foods with high carbohydrates and encouraged patient to follow a carb modified diet. Ordered RD consult for further diet education. Discussed potential complication from uncontrolled diabetes and stressed importance of improving A1C (goal of 7% or less) and maintaining good glycemic control. Patient verbalized understanding of information  discussed and she states that she has no further questions at this time related to diabetes.   Thanks, Orlando Penner, RN, MSN, CCRN, CDE Diabetes Coordinator Inpatient Diabetes Program [phone] (Team Pager) [phone] ([facility] office) [phone] [facility] office) [phone] [facility] office)

## 2015-04-20 NOTE — Plan of Care (Signed)
Problem: Discharge Progression Outcomes Goal: Other Discharge Outcomes/Goals Outcome: Progressing Plan of care progress to goal: Pt with cerebral palsy complains with mid abdominal pain that radiates around to her back.  Daughter concerned that gallstone issue is not being considered pain trigger.   Pt having nausea.  Relived with zofran. Pt remains in bed this shift.

## 2015-04-20 NOTE — Progress Notes (Signed)
Initial Nutrition Assessment   INTERVENTION:   Meals and Snacks: Cater to patient preferences Medical Food Supplement Therapy: will send Prostat BID for added protein for wound healing, Prostat provides 15g protein and 100kcals each. Education: will educate on follow-up, noting HbA1c. RD notes pt daughter LPN at [facility] where pt is a resident and per daughter pt follows blood glucose there.    NUTRITION DIAGNOSIS:   Increased nutrient needs related to wound healing as evidenced by estimated needs.  GOAL:   Patient will meet greater than or equal to 90% of their needs  MONITOR:    (Energy Intake, Electrolyte and renal Profile, Glucose Profile, Digestive System, Skin)  REASON FOR ASSESSMENT:   Consult Diet education  ASSESSMENT:    Pt admitted with sepsis secondary to UTI. Pt with h/o cerebral palsy and stage II pressure ulcers. Noted pt recently admitted to [facility] per daughter with DKA and multiple stage II pressure ulcers.  Past Medical History  Diagnosis Date  . Diabetes mellitus without complication   . Cerebral palsy   . Hypertension    Diet Order:  Diet heart healthy/carb modified Room service appropriate?: Yes; Fluid consistency:: Thin    Current Nutrition: Pt reports good appetite, eating toast this am at breakfast.   Food/Nutrition-Related History: Pt daughter reports pt with a very good appetite PTA at [facility]. Daughter reports pt getting Pro-stat TID at [facility] and was on Glucerna but pt with a good appetite and blood sugar spikes so the Glucerna was stopped.    Medications: Novolog, NS at 164mL/hr, Levemir, Niacin  Electrolyte/Renal Profile and Glucose Profile:   Recent Labs Lab 04/16/15 0450 04/18/15 1850 04/19/15 0201 04/20/15 0429  NA 137 132* 134* 138  K 3.5 4.2 4.1 4.0  CL 100* 94* 96* 100*  CO2 BUN 23* 22* 23* 22*  CREATININE 0.42* 0.56 0.49 0.48  CALCIUM 8.2* 8.1* 8.3* 8.1*  MG 1.6*  --   --   --   GLUCOSE 214*  342* 252* 183*   Lab Results  Component Value Date   HGBA1C 14.8* 04/19/2015   Protein Profile:   Recent Labs Lab 04/16/15 0450 04/18/15 1850 04/19/15 0201  ALBUMIN 1.4* 1.4* 1.5*    Gastrointestinal Profile: Last BM:  04/18/2015   Nutrition-Focused Physical Exam Findings: Nutrition-Focused physical exam completed. Findings are WDL for fat depletion, muscle depletion, and edema.    Weight Change: Pt reports weight of 202lbs at [facility] on 8/28.   Skin:   Stage II pressure ulcer on coccyx, RD notes Stage II pressure ulcers on elbow and buttock on 8/28  Height:   Ht Readings from Last 1 Encounters:  04/19/15  (1.676 m)    Weight:   Wt Readings from Last 1 Encounters:  04/20/15 212 lb 6.4 oz (96.344 kg)    Ideal Body Weight:   59kg  BMI:  Body mass index is 34.3 kg/(m^2).  Estimated Nutritional Needs:   Kcal:  1668-1971kcals, BEE: 1167kcals, TEE: (IF 1.1-1.3)(AF 1.3) using IBW of 59kg  Protein:  70-88g protein (1.2-1.5g/kg) using IBW of 59kg  Fluid:  1475-1765mL of fluid (25-64mL/kg) using IBW of 59kg  EDUCATION NEEDS:   Education needs no appropriate at this time, will review diabetic nutrition therapy on follow   MODERATE Care Level  Leda Quail, RD, LDN Pager [phone]

## 2015-04-20 NOTE — Progress Notes (Signed)
Reviewed CT report Given finding of large chronic appearing abscess would suggest surgical or IR drainage with fluid sent for routine aerobic and anaerobic culture, fungal and afb stain and culture.  TB is a possibilty as well as actinomycosis, and fungal pathogens.

## 2015-04-20 NOTE — Consult Note (Signed)
[facility] Infectious Disease     Reason for Consult:Leukocytosis, L hip mass,   Referring Physician: Vanetta Shawl Date of Admission:  04/19/2015   Active Problems:   Sepsis  HPI: Destiny Cole is a 64 y.o. female with Cerebral palsy, Obesity, poorly controlled DM (A1c 16.6) admitted with falls, L hip and back pain, stg II sacral ulcer,  wt loss of 200#s, persistent leukocytosis and found to have UA with TNTC WBC, UCX mixed and CT with a L sided soft tissue mass.   She reports being ill for approx several months with occas NS, chills, and the recurrent pain. Has a chronic dry cough as well. She lives in a rehab facility, has no travel or animal contact. No hx of Tb or TB contacts.  Past Medical History  Diagnosis Date  . Diabetes mellitus without complication   . Cerebral palsy   . Hypertension    Past Surgical History  Procedure Laterality Date  . Abdominal hysterectomy    . Mouth surgery    . Cerbral palsy     Social History  Substance Use Topics  . Smoking status: Never Smoker   . Smokeless tobacco: None  . Alcohol Use: No   Family History  Problem Relation Age of Onset  . Breast cancer Mother   . Hypertension Mother   . Diabetes Mellitus II Maternal Aunt     Allergies:  Allergies  Allergen Reactions  . Promethazine     She has a possible respiratory reaction with this medication with RR 4 and hypoxia a few minutes after administration. Other respiratory depressants like benzos should be used with caution.  . Sulfa Antibiotics Swelling and Rash    Current antibiotics: Antibiotics Given (last 72 hours)    Date/Time Action Medication Dose Rate   04/19/15 0939 Given   cefTRIAXone (ROCEPHIN) 1 g in dextrose 5 % 50 mL IVPB 1 g 100 mL/hr   04/20/15 0744 Given   cefTRIAXone (ROCEPHIN) 1 g in dextrose 5 % 50 mL IVPB 1 g 100 mL/hr      MEDICATIONS: . aspirin  81 mg Oral Daily  . atorvastatin  40 mg Oral Daily  . busPIRone  7.5 mg Oral BID  . cefTRIAXone  (ROCEPHIN)  IV  1 g Intravenous Q24H  . cholecalciferol  1,000 Units Oral Daily  . docusate sodium  100 mg Oral BID  . feeding supplement (PRO-STAT SUGAR FREE 64)  30 mL Oral BID AC  . FLUoxetine  20 mg Oral Daily  . heparin  5,000 Units Subcutaneous 3 times per day  . insulin aspart  0-20 Units Subcutaneous TID WC  . insulin detemir  27 Units Subcutaneous QHS  . iohexol  25 mL Intravenous Q1 Hr x 2  . niacin  500 mg Oral Daily  . omega-3 acid ethyl esters  1 g Oral Daily  . sodium chloride  3 mL Intravenous Q12H    Review of Systems - 11 systems reviewed and negative per HPI   OBJECTIVE: Temp:  [98 F (36.7 C)-98.5 F (36.9 C)] 98 F (36.7 C) (09/12 1248) Pulse Rate:  [93-107] 102 (09/12 1248) Resp:  [18] 18 (09/12 0529) BP: (118-132)/(50-79) 132/79 mmHg (09/12 1248) SpO2:  [88 %-94 %] 91 % (09/12 1248) Weight:  [96.344 kg (212 lb 6.4 oz)] 96.344 kg (212 lb 6.4 oz) (09/12 0500) Physical Exam  Constitutional:  oriented to person, place, and time. Obese,  HENT: [place]/AT, PERRLA, no scleral icterus Mouth/Throat: Oropharynx is clear and moist.  No oropharyngeal exudate.  Cardiovascular: Normal rate, regular rhythm and normal heart sounds.  Pulmonary/Chest: Effort normal and breath sounds normal. No respiratory distress.  has no wheezes.  Neck - supple, no nuchal rigidity Abdominal: Soft. L post flank with marked tendenrss Lymphadenopathy: no cervical adenopathy. No axillary adenopathy Neurological: alert and oriented to person, place, and time.  Skin: Skin is warm and dry. No rash noted. No erythema.  Psychiatric: a normal mood and affect.  behavior is normal.   LABS: Results for orders placed or performed during the hospital encounter of 04/19/15 (from the past 48 hour(s))  CBC with Differential     Status: Abnormal   Collection Time: 04/19/15  2:01 AM  Result Value Ref Range   WBC 20.4 (H) 3.6 - 11.0 K/uL   RBC 3.34 (L) 3.80 - 5.20 MIL/uL   Hemoglobin 9.8 (L) 12.0 - 16.0  g/dL   HCT 30.5 (L) 35.0 - 47.0 %   MCV 91.3 80.0 - 100.0 fL   MCH 29.5 26.0 - 34.0 pg   MCHC 32.3 32.0 - 36.0 g/dL   RDW 14.6 (H) 11.5 - 14.5 %   Platelets 739 (H) 150 - 440 K/uL   Neutrophils Relative % 80 %   Neutro Abs 16.2 (H) 1.4 - 6.5 K/uL   Lymphocytes Relative 11 %   Lymphs Abs 2.3 1.0 - 3.6 K/uL   Monocytes Relative 8 %   Monocytes Absolute 1.7 (H) 0.2 - 0.9 K/uL   Eosinophils Relative 1 %   Eosinophils Absolute 0.2 0 - 0.7 K/uL   Basophils Relative 0 %   Basophils Absolute 0.1 0 - 0.1 K/uL  Comprehensive metabolic panel     Status: Abnormal   Collection Time: 04/19/15  2:01 AM  Result Value Ref Range   Sodium 134 (L) 135 - 145 mmol/L   Potassium 4.1 3.5 - 5.1 mmol/L   Chloride 96 (L) 101 - 111 mmol/L   CO2 30 22 - 32 mmol/L   Glucose, Bld 252 (H) 65 - 99 mg/dL   BUN 23 (H) 6 - 20 mg/dL   Creatinine, Ser 0.49 0.44 - 1.00 mg/dL   Calcium 8.3 (L) 8.9 - 10.3 mg/dL   Total Protein 7.1 6.5 - 8.1 g/dL   Albumin 1.5 (L) 3.5 - 5.0 g/dL   AST 19 15 - 41 U/L   ALT 11 (L) 14 - 54 U/L   Alkaline Phosphatase 204 (H) 38 - 126 U/L   Total Bilirubin 0.3 0.3 - 1.2 mg/dL   GFR calc non Af Amer >60 >60 mL/min   GFR calc Af Amer >60 >60 mL/min    Comment: (NOTE) The eGFR has been calculated using the CKD EPI equation. This calculation has not been validated in all clinical situations. eGFR's persistently <60 mL/min signify possible Chronic Kidney Disease.    Anion gap 8 5 - 15  Lipase, blood     Status: Abnormal   Collection Time: 04/19/15  2:01 AM  Result Value Ref Range   Lipase 12 (L) 22 - 51 U/L  Troponin I     Status: Abnormal   Collection Time: 04/19/15  2:01 AM  Result Value Ref Range   Troponin I 0.05 (H) <0.031 ng/mL    Comment: READ BACK AND VERIFIED WITH [facility] AT 0236 04/19/15.PMH        PERSISTENTLY INCREASED TROPONIN VALUES IN THE RANGE OF 0.04-0.49 ng/mL CAN BE SEEN IN:       -UNSTABLE ANGINA       -  CONGESTIVE HEART FAILURE       -MYOCARDITIS        -CHEST TRAUMA       -ARRYHTHMIAS       -LATE PRESENTING MYOCARDIAL INFARCTION       -COPD   CLINICAL FOLLOW-UP RECOMMENDED.   Urinalysis complete, with microscopic ([facility] only)     Status: Abnormal   Collection Time: 04/19/15  2:01 AM  Result Value Ref Range   Color, Urine YELLOW (A) YELLOW   APPearance HAZY (A) CLEAR   Glucose, UA 50 (A) NEGATIVE mg/dL   Bilirubin Urine NEGATIVE NEGATIVE   Ketones, ur NEGATIVE NEGATIVE mg/dL   Specific Gravity, Urine 1.019 1.005 - 1.030   Hgb urine dipstick NEGATIVE NEGATIVE   pH 5.0 5.0 - 8.0   Protein, ur NEGATIVE NEGATIVE mg/dL   Nitrite NEGATIVE NEGATIVE   Leukocytes, UA 1+ (A) NEGATIVE   RBC / HPF 6-30 0 - 5 RBC/hpf   WBC, UA TOO NUMEROUS TO COUNT 0 - 5 WBC/hpf   Bacteria, UA NONE SEEN NONE SEEN   Squamous Epithelial / LPF 0-5 (A) NONE SEEN   Mucous PRESENT    Budding Yeast PRESENT   Urine culture     Status: None   Collection Time: 04/19/15  2:01 AM  Result Value Ref Range   Specimen Description URINE, RANDOM    Special Requests Normal    Culture MULTIPLE SPECIES PRESENT, SUGGEST RECOLLECTION    Report Status 04/20/2015 FINAL   TSH     Status: None   Collection Time: 04/19/15  2:01 AM  Result Value Ref Range   TSH 4.053 0.350 - 4.500 uIU/mL  Culture, blood (routine x 2)     Status: None (Preliminary result)   Collection Time: 04/19/15  3:21 AM  Result Value Ref Range   Specimen Description BLOOD RIGHT HAND    Special Requests BOTTLES DRAWN AEROBIC AND ANAEROBIC 5CC    Culture NO GROWTH 1 DAY    Report Status PENDING   Culture, blood (routine x 2)     Status: None (Preliminary result)   Collection Time: 04/19/15  3:21 AM  Result Value Ref Range   Specimen Description BLOOD RIGHT WRIST    Special Requests BOTTLES DRAWN AEROBIC AND ANAEROBIC 4CC    Culture NO GROWTH 1 DAY    Report Status PENDING   Troponin I     Status: Abnormal   Collection Time: 04/19/15  5:29 AM  Result Value Ref Range   Troponin I 0.05 (H) <0.031 ng/mL     Comment: RESULTS PREVIOUSLY CALLED AT 0236 04/19/15.PMH        PERSISTENTLY INCREASED TROPONIN VALUES IN THE RANGE OF 0.04-0.49 ng/mL CAN BE SEEN IN:       -UNSTABLE ANGINA       -CONGESTIVE HEART FAILURE       -MYOCARDITIS       -CHEST TRAUMA       -ARRYHTHMIAS       -LATE PRESENTING MYOCARDIAL INFARCTION       -COPD   CLINICAL FOLLOW-UP RECOMMENDED.   Hemoglobin A1c     Status: Abnormal   Collection Time: 04/19/15  5:30 AM  Result Value Ref Range   Hgb A1c MFr Bld 14.8 (H) 4.0 - 6.0 %  Glucose, capillary     Status: Abnormal   Collection Time: 04/19/15  7:19 AM  Result Value Ref Range   Glucose-Capillary 144 (H) 65 - 99 mg/dL  Troponin I     Status: None  Collection Time: 04/19/15 11:03 AM  Result Value Ref Range   Troponin I 0.03 <0.031 ng/mL    Comment:        NO INDICATION OF MYOCARDIAL INJURY.   Glucose, capillary     Status: Abnormal   Collection Time: 04/19/15 11:09 AM  Result Value Ref Range   Glucose-Capillary 174 (H) 65 - 99 mg/dL  MRSA PCR Screening     Status: None   Collection Time: 04/19/15  1:15 PM  Result Value Ref Range   MRSA by PCR NEGATIVE NEGATIVE    Comment:        The GeneXpert MRSA Assay (FDA approved for NASAL specimens only), is one component of a comprehensive MRSA colonization surveillance program. It is not intended to diagnose MRSA infection nor to guide or monitor treatment for MRSA infections.   Glucose, capillary     Status: Abnormal   Collection Time: 04/19/15  4:21 PM  Result Value Ref Range   Glucose-Capillary 132 (H) 65 - 99 mg/dL  Troponin I     Status: None   Collection Time: 04/19/15  5:03 PM  Result Value Ref Range   Troponin I 0.03 <0.031 ng/mL    Comment:        NO INDICATION OF MYOCARDIAL INJURY.   Glucose, capillary     Status: Abnormal   Collection Time: 04/19/15  9:06 PM  Result Value Ref Range   Glucose-Capillary 174 (H) 65 - 99 mg/dL  CBC     Status: Abnormal   Collection Time: 04/20/15  4:29 AM   Result Value Ref Range   WBC 19.3 (H) 3.6 - 11.0 K/uL   RBC 3.04 (L) 3.80 - 5.20 MIL/uL   Hemoglobin 9.0 (L) 12.0 - 16.0 g/dL   HCT 28.3 (L) 35.0 - 47.0 %   MCV 93.2 80.0 - 100.0 fL   MCH 29.7 26.0 - 34.0 pg   MCHC 31.8 (L) 32.0 - 36.0 g/dL   RDW 14.5 11.5 - 14.5 %   Platelets 605 (H) 150 - 440 K/uL  Basic metabolic panel     Status: Abnormal   Collection Time: 04/20/15  4:29 AM  Result Value Ref Range   Sodium 138 135 - 145 mmol/L   Potassium 4.0 3.5 - 5.1 mmol/L   Chloride 100 (L) 101 - 111 mmol/L   CO2 30 22 - 32 mmol/L   Glucose, Bld 183 (H) 65 - 99 mg/dL   BUN 22 (H) 6 - 20 mg/dL   Creatinine, Ser 0.48 0.44 - 1.00 mg/dL   Calcium 8.1 (L) 8.9 - 10.3 mg/dL   GFR calc non Af Amer >60 >60 mL/min   GFR calc Af Amer >60 >60 mL/min    Comment: (NOTE) The eGFR has been calculated using the CKD EPI equation. This calculation has not been validated in all clinical situations. eGFR's persistently <60 mL/min signify possible Chronic Kidney Disease.    Anion gap 8 5 - 15  Glucose, capillary     Status: Abnormal   Collection Time: 04/20/15  7:19 AM  Result Value Ref Range   Glucose-Capillary 158 (H) 65 - 99 mg/dL  Glucose, capillary     Status: Abnormal   Collection Time: 04/20/15 11:38 AM  Result Value Ref Range   Glucose-Capillary 200 (H) 65 - 99 mg/dL   No components found for: ESR, C REACTIVE PROTEIN MICRO: Recent Results (from the past 720 hour(s))  MRSA PCR Screening     Status: None   Collection Time: 04/05/15  10:59 PM  Result Value Ref Range Status   MRSA by PCR NEGATIVE NEGATIVE Final    Comment:        The GeneXpert MRSA Assay (FDA approved for NASAL specimens only), is one component of a comprehensive MRSA colonization surveillance program. It is not intended to diagnose MRSA infection nor to guide or monitor treatment for MRSA infections.   Urine culture     Status: None (Preliminary result)   Collection Time: 04/18/15  7:00 PM  Result Value Ref Range  Status   Specimen Description URINE, CLEAN CATCH  Final   Special Requests NONE  Final   Culture HOLDING FOR POSSIBLE PATHOGEN  Final   Report Status PENDING  Incomplete  Urine culture     Status: None   Collection Time: 04/19/15  2:01 AM  Result Value Ref Range Status   Specimen Description URINE, RANDOM  Final   Special Requests Normal  Final   Culture MULTIPLE SPECIES PRESENT, SUGGEST RECOLLECTION  Final   Report Status 04/20/2015 FINAL  Final  Culture, blood (routine x 2)     Status: None (Preliminary result)   Collection Time: 04/19/15  3:21 AM  Result Value Ref Range Status   Specimen Description BLOOD RIGHT HAND  Final   Special Requests BOTTLES DRAWN AEROBIC AND ANAEROBIC 5CC  Final   Culture NO GROWTH 1 DAY  Final   Report Status PENDING  Incomplete  Culture, blood (routine x 2)     Status: None (Preliminary result)   Collection Time: 04/19/15  3:21 AM  Result Value Ref Range Status   Specimen Description BLOOD RIGHT WRIST  Final   Special Requests BOTTLES DRAWN AEROBIC AND ANAEROBIC 4CC  Final   Culture NO GROWTH 1 DAY  Final   Report Status PENDING  Incomplete  MRSA PCR Screening     Status: None   Collection Time: 04/19/15  1:15 PM  Result Value Ref Range Status   MRSA by PCR NEGATIVE NEGATIVE Final    Comment:        The GeneXpert MRSA Assay (FDA approved for NASAL specimens only), is one component of a comprehensive MRSA colonization surveillance program. It is not intended to diagnose MRSA infection nor to guide or monitor treatment for MRSA infections.     IMAGING: Dg Chest 1 View  04/01/2015   CLINICAL DATA:  Hyperglycemia. Unwitnessed fall today. Complains of right rib pain.  EXAM: CHEST  1 VIEW  COMPARISON:  10/29/2012  FINDINGS: The heart size and mediastinal contours are within normal limits. Both lungs are clear. The visualized skeletal structures are unremarkable.  IMPRESSION: No active disease.   Electronically Signed   By: Kerby Moors M.D.    On: 04/01/2015 21:16   Dg Chest 2 View  04/05/2015   CLINICAL DATA:  Weakness and hypertension.  Diabetic.  EXAM: CHEST  2 VIEW  COMPARISON:  04/01/2015 and 10/30/2014  FINDINGS: Patient is rotated to the right. Lungs are somewhat hypoinflated without definite consolidation or effusion. Cardiomediastinal silhouette and remainder of the exam is unchanged.  IMPRESSION: No active cardiopulmonary disease.   Electronically Signed   By: Marin Olp M.D.   On: 04/05/2015 18:55   Dg Lumbar Spine Complete  04/01/2015   CLINICAL DATA:  Hyperglycemia, fall  EXAM: LUMBAR SPINE - COMPLETE 4+ VIEW  COMPARISON:  None.  FINDINGS: There is no evidence of lumbar spine fracture. Alignment is normal. Intervertebral disc spaces are maintained.  IMPRESSION: Negative.   Electronically Signed  By: Kathreen Devoid   On: 04/01/2015 21:15   Dg Pelvis 1-2 Views  04/01/2015   CLINICAL DATA:  Hyperglycemia and unwitnessed fall today. Sacrococcygeal pain.  EXAM: PELVIS - 1-2 VIEW  COMPARISON:  None.  FINDINGS: No evidence of pelvic fracture.  No evidence of hip fracture.  IMPRESSION: Negative.   Electronically Signed   By: Nelson Chimes M.D.   On: 04/01/2015 21:16   Dg Sacrum/coccyx  04/01/2015   CLINICAL DATA:  Hyperglycemia and fall. Tail bone pain. Unwitnessed fall today.  EXAM: SACRUM AND COCCYX - 2+ VIEW  COMPARISON:  None.  FINDINGS: No evidence of sacral or coccygeal fracture. Lateral views are markedly degraded by overlying artifact because of patient positioning.  IMPRESSION: No fracture seen. Coccygeal detail is poor because of overlying artifact.   Electronically Signed   By: Nelson Chimes M.D.   On: 04/01/2015 21:16   Ct Head Wo Contrast  04/05/2015   CLINICAL DATA:  Progressive weakness.  Fell.  EXAM: CT HEAD WITHOUT CONTRAST  TECHNIQUE: Contiguous axial images were obtained from the base of the skull through the vertex without intravenous contrast.  COMPARISON:  10/28/2012, 12/12/2008.  FINDINGS: There is no  intracranial hemorrhage or extra-axial fluid collection. There is chronic encephalomalacia in the left centrum semiovale. There is mild generalized atrophy. The brain is otherwise unremarkable and unchanged. Calvarium and skullbase are intact. There is no bony abnormality.  IMPRESSION: Chronic encephalomalacia in the left centrum semiovale without significant interval change. Mild generalized atrophy. No acute findings.   Electronically Signed   By: Andreas Newport M.D.   On: 04/05/2015 21:26   Ct Pelvis Wo Contrast  04/19/2015   CLINICAL DATA:  64 year old female with a history of left hip and pelvic pain since July. History of 2 falls.  EXAM: CT PELVIS WITHOUT CONTRAST  TECHNIQUE: Multidetector CT imaging of the pelvis was performed following the standard protocol without intravenous contrast.  COMPARISON:  Plain film 04/05/2015, 04/01/2015  FINDINGS: Musculoskeletal:  Mild degenerative disc disease. No evidence of bony narrowing at the L4-S1 level.  No sacral fracture identified.  Bony pelvis intact without acute fracture. Mild degenerative changes at the bilateral sacroiliac joints.  Unremarkable appearance of the right hip, which is congruent. Minimal joint space narrowing. No subchondral sclerosis or cyst formation. Unremarkable appearance of the proximal right femur.  No evidence of left hip fracture. Unremarkable appearance of the proximal left femur. Mild joint space narrowing without subchondral sclerosis or cyst formation. Left hip is congruent.  Ill-defined intermediate density soft tissue involving the abdominal wall musculature superior to the left iliac bone. Soft tissue dimension measures 9.1 cm by 10.0 cm on axial imaging. There is disruption of the normal architecture of the abdominal wall musculature at this site. Soft tissue extends into the retroperitoneum with ill-defined margins of the left psoas muscle and retroperitoneal fat. Superior extent not entirely imaged.  Intra-abdominal/pelvic:   Unremarkable appearance of visualized bowel.  Atherosclerotic calcifications of the vasculature.  Unremarkable appearance of the urinary bladder.  Surgical changes of hysterectomy.  IMPRESSION: No acute fracture identified, specifically involving the left hip.  Mild osteoarthritis of the bilateral hips.  Abnormal soft tissue disrupting the posterior abdominal wall musculature extending caudally from the left iliac bone, incompletely imaged. Differential diagnosis includes hematoma, or alternatively soft tissue mass there also appears to be involvement of the left psoas musculature and retroperitoneum, again, incompletely imaged. Further evaluation with a contrast-enhanced abdomen/pelvis CT is recommend.  Signed,  Dulcy Fanny. Earleen Newport,  DO  [facility]  [facility]   Electronically Signed   By: Corrie Mckusick D.O.   On: 04/19/2015 10:47   Mr Brain Wo Contrast  04/07/2015   CLINICAL DATA:  64 year old female with facial droop, right side weakness. DKA. Initial encounter.  EXAM: MRI HEAD WITHOUT CONTRAST  TECHNIQUE: Multiplanar, multiecho pulse sequences of the brain and surrounding structures were obtained without intravenous contrast.  COMPARISON:  Head CTs without contrast 04/05/2015 and earlier.  FINDINGS: Study is intermittently degraded by motion artifact despite repeated imaging attempts.  Chronic cystic white matter encephalomalacia in the posterior left frontal lobe. Mild ex vacuo enlargement of the adjacent left lateral ventricle. Mild surrounding white matter gliosis. No associated hemosiderin. Associated volume loss in the posterior body of the corpus callosum. Wallerian degeneration also evident at the left midbrain (series 6, image 12).  Elsewhere cerebral volume is within normal limits for age. No restricted diffusion to suggest acute infarction. No midline shift, mass effect, evidence of mass lesion, ventriculomegaly, extra-axial collection or acute  intracranial hemorrhage. Cervicomedullary junction and pituitary are within normal limits. Major intracranial vascular flow voids are grossly preserved. Negative visualized cervical spine.  Elsewhere gray and white matter signal is within normal limits.  Visible internal auditory structures appear normal. Mastoids are clear. Trace paranasal sinus mucosal thickening. Negative orbit and scalp soft tissues. Normal bone marrow signal.  IMPRESSION: 1.  No acute intracranial abnormality. 2. 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   Electronically Signed   By: Genevie Ann M.D.   On: 04/07/2015 18:52   US Abdomen Complete  04/06/2015   CLINICAL DATA:  Nausea.  EXAM: ULTRASOUND ABDOMEN COMPLETE  COMPARISON:  None.  FINDINGS: Gallbladder: Numerous gallstones are noted in the gallbladder. The largest measures 22 mm. The gallbladder is mildly contracted. No gallbladder wall thickening or pericholecystic fluid. No sonographic Murphy sign.  Common bile duct: Diameter: 3.3 mm  Liver: Normal echogenicity without focal lesion or biliary dilatation.  IVC: Normal caliber  Pancreas: Visualized portion unremarkable.  Spleen: Normal size and echogenicity without focal lesions.  Right Kidney: Length: 14.3 cm. Normal renal cortical thickness and echogenicity without focal lesions or hydronephrosis.  Left Kidney: Length: 13.3 cm. Normal renal cortical thickness and echogenicity without focal lesions or hydronephrosis.  Abdominal aorta: Normal caliber  Other findings: No ascites  IMPRESSION: Cholelithiasis without definite sonographic findings for acute cholecystitis.  Normal caliber common bile duct.   Electronically Signed   By: Marijo Sanes M.D.   On: 04/06/2015 16:53   Dg Pelvis Portable  04/05/2015   CLINICAL DATA:  Chronic sacrococcygeal pain, with pressure sores. Recent bilateral lower extremity weakness.  EXAM: PORTABLE PELVIS 1-2 VIEWS  COMPARISON:  04/01/2015  FINDINGS: Negative  for fracture or dislocation about either hip. The pubic symphysis and sacroiliac joints appear grossly intact. There is no bone lesion or bony destruction.  IMPRESSION: Negative for acute fracture.   Electronically Signed   By: Andreas Newport M.D.   On: 04/05/2015 21:37   Dg Chest Port 1 View  04/19/2015   CLINICAL DATA:  Chest pain and left-sided abdominal pain since July 27th. Worse tonight.  EXAM: PORTABLE CHEST - 1 VIEW  COMPARISON:  04/05/2015  FINDINGS: Shallow inspiration. Cardiac enlargement with mild perihilar infiltration suggesting edema. No blunting of costophrenic angles. No pneumothorax. Increased density over the lung bases is probably due to soft tissue attenuation.  IMPRESSION: Shallow inspiration. Cardiac enlargement with suggestion  of perihilar infiltration or edema.   Electronically Signed   By: Lucienne Capers M.D.   On: 04/19/2015 03:10    Assessment:   EVOLET SALMINEN is a 64 y.o. female with cerebral palsy, Obesity, poorly controlled DM (A1c 16.6) admitted with falls, L hip and back pain, stg II sacral ulcer,  wt loss of 200#s, persistent leukocytosis and found to have UA with TNTC WBC, UCX mixed and CT with a L sided soft tissue mass.   She reports being ill for approx several months with occas NS, chills, and the recurrent pain. Has a chronic dry cough as well. She lives in a rehab facility, has no travel or animal contact. No hx of Tb or TB contacts.  Recommendations Agree with CT to further eval Would likely need bxp to determine etiology Cont ceftraixone. Check HIV and Quantiferon Gold  Thank you very much for allowing me to participate in the care of this patient. Please call with questions.   Cheral Marker. Ola Spurr, MD

## 2015-04-21 ENCOUNTER — Inpatient Hospital Stay: Payer: Medicaid Other

## 2015-04-21 DIAGNOSIS — L8990 Pressure ulcer of unspecified site, unspecified stage: Secondary | ICD-10-CM

## 2015-04-21 LAB — CBC
HCT: 25.4 % — ABNORMAL LOW (ref 35.0–47.0)
HEMOGLOBIN: 8.2 g/dL — AB (ref 12.0–16.0)
MCH: 29.5 pg (ref 26.0–34.0)
MCHC: 32.1 g/dL (ref 32.0–36.0)
MCV: 91.8 fL (ref 80.0–100.0)
Platelets: 602 10*3/uL — ABNORMAL HIGH (ref 150–440)
RBC: 2.77 MIL/uL — AB (ref 3.80–5.20)
RDW: 14.7 % — ABNORMAL HIGH (ref 11.5–14.5)
WBC: 15.8 10*3/uL — ABNORMAL HIGH (ref 3.6–11.0)

## 2015-04-21 LAB — GLUCOSE, CAPILLARY
Glucose-Capillary: 224 mg/dL — ABNORMAL HIGH (ref 65–99)
Glucose-Capillary: 225 mg/dL — ABNORMAL HIGH (ref 65–99)
Glucose-Capillary: 240 mg/dL — ABNORMAL HIGH (ref 65–99)
Glucose-Capillary: 135 mg/dL — ABNORMAL HIGH (ref 65–99)

## 2015-04-21 LAB — BASIC METABOLIC PANEL
ANION GAP: 6 (ref 5–15)
BUN: 21 mg/dL — AB (ref 6–20)
CHLORIDE: 100 mmol/L — AB (ref 101–111)
CO2: 29 mmol/L (ref 22–32)
Calcium: 7.6 mg/dL — ABNORMAL LOW (ref 8.9–10.3)
Creatinine, Ser: 0.39 mg/dL — ABNORMAL LOW (ref 0.44–1.00)
GFR calc Af Amer: >60 mL/min (ref >60)
GFR calc non Af Amer: >60 mL/min (ref >60)
Glucose, Bld: 261 mg/dL — ABNORMAL HIGH (ref 65–99)
POTASSIUM: 3.6 mmol/L (ref 3.5–5.1)
SODIUM: 135 mmol/L (ref 135–145)

## 2015-04-21 LAB — PROTIME-INR
INR: 1.14
Prothrombin Time: 14.8 seconds (ref 11.4–15.0)

## 2015-04-21 LAB — APTT: aPTT: 40 seconds — ABNORMAL HIGH (ref 24–36)

## 2015-04-21 MED ORDER — INSULIN DETEMIR 100 UNIT/ML ~~LOC~~ SOLN
30.0000 [IU] | Freq: Every day | SUBCUTANEOUS | Status: DC
  Administered 2015-04-21 – 2015-04-23 (×3): 30 [IU] via SUBCUTANEOUS
  Filled 2015-04-21 (×4): qty 0.3

## 2015-04-21 MED ORDER — FENTANYL CITRATE (PF) 100 MCG/2ML IJ SOLN
INTRAMUSCULAR | Status: AC | PRN
  Administered 2015-04-21: 50 ug via INTRAVENOUS

## 2015-04-21 MED ORDER — MIDAZOLAM HCL 5 MG/5ML IJ SOLN
INTRAMUSCULAR | Status: AC
  Filled 2015-04-21: qty 5

## 2015-04-21 MED ORDER — MIDAZOLAM HCL 5 MG/5ML IJ SOLN
INTRAMUSCULAR | Status: AC | PRN
  Administered 2015-04-21: 15:00:00 0.5 mg via INTRAVENOUS
  Administered 2015-04-21: 1 mg via INTRAVENOUS

## 2015-04-21 MED ORDER — FENTANYL CITRATE (PF) 100 MCG/2ML IJ SOLN
INTRAMUSCULAR | Status: AC
  Filled 2015-04-21: qty 4

## 2015-04-21 MED ORDER — INSULIN ASPART 100 UNIT/ML ~~LOC~~ SOLN
0.0000 [IU] | Freq: Three times a day (TID) | SUBCUTANEOUS | Status: DC
  Administered 2015-04-21 (×2): 3 [IU] via SUBCUTANEOUS
  Administered 2015-04-22: 17:00:00 7 [IU] via SUBCUTANEOUS
  Administered 2015-04-22 (×2): 4 [IU] via SUBCUTANEOUS
  Administered 2015-04-23: 3 [IU] via SUBCUTANEOUS
  Administered 2015-04-23 (×2): 4 [IU] via SUBCUTANEOUS
  Administered 2015-04-24: 12:00:00 3 [IU] via SUBCUTANEOUS
  Filled 2015-04-21: qty 3
  Filled 2015-04-21: qty 7
  Filled 2015-04-21: qty 3
  Filled 2015-04-21 (×2): qty 4
  Filled 2015-04-21 (×2): qty 3
  Filled 2015-04-21: qty 4
  Filled 2015-04-21: qty 3
  Filled 2015-04-21: qty 4

## 2015-04-21 MED ORDER — SODIUM CHLORIDE 0.9 % IV SOLN: INTRAVENOUS | Status: DC

## 2015-04-21 NOTE — Progress Notes (Signed)
PT Cancellation Note  Patient Details Name: BULAR HICKOK MRN: [id number] DOB: 1950/12/13   Cancelled Treatment:    Reason Eval/Treat Not Completed: Patient at procedure or test/unavailable (Pt off floor for procedure.)  Will re-attempt PT at a later date/time as medically appropriate.   Irving Burton Makinzi Prieur 04/21/2015, 2:48 PM [name], PT [phone]

## 2015-04-21 NOTE — Progress Notes (Signed)
Spoke to Dr Sampson Goon he said no need for isolation he does not think the patient has TB

## 2015-04-21 NOTE — Progress Notes (Signed)
[facility] - Time at [facility]   PATIENT NAME: Destiny Cole    MR#:  [id number]  DATE OF BIRTH:  [birth year]  SUBJECTIVE:  CHIEF COMPLAINT:   Chief Complaint  Patient presents with  . Chest Pain    Came with sepsis, have complain of pain in left hip. No new complains. Found to have an abscess  Around left pelvis- she is going for CT guided drainage for today.  REVIEW OF SYSTEMS:  CONSTITUTIONAL: No fever, have generalized fatigue or weakness.  EYES: No blurred or double vision.  EARS, NOSE, AND THROAT: No tinnitus or ear pain.  RESPIRATORY: No cough, shortness of breath, wheezing or hemoptysis.  CARDIOVASCULAR: No chest pain, orthopnea, edema.  GASTROINTESTINAL: No nausea, vomiting, diarrhea or abdominal pain.  GENITOURINARY: No dysuria, hematuria.  ENDOCRINE: No polyuria, nocturia,  HEMATOLOGY: No anemia, easy bruising or bleeding SKIN: No rash or lesion. MUSCULOSKELETAL: left hip joint pain or arthritis.   NEUROLOGIC: No tingling, numbness, weakness.  PSYCHIATRY: No anxiety or depression.   ROS  DRUG ALLERGIES:   Allergies  Allergen Reactions  . Promethazine     She has a possible respiratory reaction with this medication with RR 4 and hypoxia a few minutes after administration. Other respiratory depressants like benzos should be used with caution.  . Sulfa Antibiotics Swelling and Rash    VITALS:  Blood pressure 136/61, pulse 81, temperature 98.6 F (37 C), temperature source Oral, resp. rate 20, height  (1.676 m), weight 100.925 kg (222 lb 8 oz), SpO2 98 %.  PHYSICAL EXAMINATION:  GENERAL:  64 y.o.-year-old patient lying in the bed with no acute distress.  EYES: Pupils equal, round, reactive to light and accommodation. No scleral icterus. Extraocular muscles intact.  HEENT: Head atraumatic, normocephalic. Oropharynx and nasopharynx clear.  NECK:  Supple, no jugular venous distention. No thyroid enlargement, no tenderness.   LUNGS: Normal breath sounds bilaterally, no wheezing, rales,rhonchi or crepitation. No use of accessory muscles of respiration.  CARDIOVASCULAR: S1, S2 normal. No murmurs, rubs, or gallops.  ABDOMEN: Soft, nontender, nondistended. Bowel sounds present. No organomegaly or mass. Tender on left side of hip and lower back. EXTREMITIES: No pedal edema, cyanosis, or clubbing.  NEUROLOGIC: Cranial nerves II through XII are intact. Muscle strength 4/5 in left side extremities, right side extremities have weakness and contracture . Sensation intact. Gait not checked.  PSYCHIATRIC: The patient is alert and oriented x 3.  SKIN: No obvious rash, lesion, or ulcer.   Physical Exam LABORATORY PANEL:   CBC  Recent Labs Lab 04/21/15 0456  WBC 15.8*  HGB 8.2*  HCT 25.4*  PLT 602*   ------------------------------------------------------------------------------------------------------------------  Chemistries   Recent Labs Lab 04/16/15 0450  04/19/15 0201  04/21/15 0456  NA 137  < > 134*  < > 135  K 3.5  < > 4.1  < > 3.6  CL 100*  < > 96*  < > 100*  CO2 30  < > 30  < > 29  GLUCOSE 214*  < > 252*  < > 261*  BUN 23*  < > 23*  < > 21*  CREATININE 0.42*  < > 0.49  < > 0.39*  CALCIUM 8.2*  < > 8.3*  < > 7.6*  MG 1.6*  --   --   --   --   AST 18  < > 19  --   --   ALT 10*  < > 11*  --   --  ALKPHOS 183*  < > 204*  --   --   BILITOT 0.2*  < > 0.3  --   --   < > = values in this interval not displayed. ------------------------------------------------------------------------------------------------------------------  Cardiac Enzymes  Recent Labs Lab 04/19/15 1103 04/19/15 1703  TROPONINI 0.03 0.03   ------------------------------------------------------------------------------------------------------------------  RADIOLOGY:  Ct Abdomen Pelvis W Contrast  04/20/2015   CLINICAL DATA:  Left flank and upper quadrant pain for 2 months with nausea and vomiting. Urinary tract infection with  leukocytosis and poorly controlled diabetes.  EXAM: CT ABDOMEN AND PELVIS WITH CONTRAST  TECHNIQUE: Multidetector CT imaging of the abdomen and pelvis was performed using the standard protocol following bolus administration of intravenous contrast.  CONTRAST:  OMNIPAQUE IOHEXOL 300 MG/ML  SOLN  COMPARISON:  None.  FINDINGS: Lower chest and abdominal wall: There are layering bilateral pleural effusions which are small. Fluid collection discussed below is in close proximity to the posterior diaphragm, but there is no complexity to suggest empyema.  Hepatobiliary: No focal liver abnormality.Numerous layering gallstones. No indication of cholecystitis.  Pancreas: Unremarkable.  Spleen: Mild granulomatous changes.  Adrenals/Urinary Tract: The left flank abnormality on previous pelvis CT reflects a multiloculated rim enhancing fluid collection which measures up to 17 x 16 x 11 cm in maximal dimension. Fluid up lifts the left kidney and is contiguous with interpolar cortical hypoenhancement, best visualized on delayed imaging. The fluid is in the perirenal, posterior para renal, and subcutaneous left flank. Given the unusual growth through the abdominal wall, atypical infection should be considered. There is no calcification or hydronephrosis typical of renal tuberculosis. No nodular mass like areas of enhancement to suggest a cystic sarcoma. The surrounding fat and muscle is infiltrated and inflamed. No hydronephrosis. 9 mm stone the lower pole left kidney.  Unremarkable bladder.  Reproductive:Hysterectomy.  Negative adnexae.  Stomach/Bowel:  No obstruction. No appendicitis.  Vascular/Lymphatic: No acute vascular abnormality. No mass or adenopathy.  Peritoneal: No ascites or pneumoperitoneum.  Musculoskeletal: No evidence of spinal infection.  IMPRESSION: 1. The left flank mass is a 17 x 16 x 11 cm multi loculated fluid collection extending from the perirenal space through the left abdominal wall, consistent with  abscess. Given unusual growth through the abdominal wall, tuberculous abscess should also be considered. 2. Left interpolar hypo enhancement, presumed pyelonephritis, attention on follow-up imaging. 3. Nonobstructive left nephrolithiasis. 4. Small bilateral pleural effusion. The fluid collection is in close proximity to the left pleural space, but no indication of empyema. 5. Cholelithiasis.   Electronically Signed   By: Marnee Spring M.D.   On: 04/20/2015 17:09    ASSESSMENT AND PLAN:   1. Sepsis:   Blood cultures and urine cultures obtained in the emergency department. She is received 1 dose of IV Zosyn. She is hemodynamically stable. on rocephin.  2. Urinary tract infection: Likely source of sepsis, she is also having a sacral decubetus ulcer and c/o pain in lower back- CT showed some fluid collection/ possibly abscess.  called Surgery consult to help. And ID.  will get CT guided Drainage today and send labs for AFB, Fungus and Cultures..  I poke to pt's daughter- Ms.Dillard to update her.  3. Chest pain: Reproducible. Upon his elevated but most likely secondary to sepsis. Continue to follow cardiac biomarkers. Continue aspirin  4. Diabetes mellitus type 2: Continue basal insulin dosing adjusted for hospital diet. Place patient on sliding scale insulin as well. 5. Hyperlipidemia: Continue statin therapy. 6. Depression: Continue buspirone and fluoxetine  7. DVT prophylaxis: Heparin 8. GI prophylaxis: Pantoprazole  All the records are reviewed and case discussed with Care Management/Social Workerr. Management plans discussed with the patient, family and they are in agreement.  CODE STATUS: DNR  TOTAL TIME TAKING CARE OF THIS PATIENT: 45 minutes.  >50% of time spent in co-ordination of care.  Discussed with Dr. Michela Pitcher, Dr. Sampson Goon, pt's daughter on phone,.  POSSIBLE D/C IN 3-4 DAYS, DEPENDING ON CLINICAL CONDITION.   Altamese Dilling M.D on 04/21/2015   Between 7am to 6pm -  Pager - [phone]  After 6pm go to [email or web address] - password [email or web address] [facility]  [facility]  Office  [phone]  CC: Primary care physician; Egbert Garibaldi, NP

## 2015-04-21 NOTE — Progress Notes (Signed)
Inpatient Diabetes Program Recommendations  AACE/ADA: New Consensus Statement on Inpatient Glycemic Control (2013)  Target Ranges:  Prepandial:   less than 140 mg/dL      Peak postprandial:   less than 180 mg/dL (1-2 hours)      Critically ill patients:  140 - 180 mg/dL   Results for Destiny Cole, Destiny Cole (MRN [id number]) as of 04/21/2015 07:52  Ref. Range 04/20/2015 07:19 04/20/2015 11:38 04/20/2015 16:15 04/20/2015 21:29 04/21/2015 07:31  Glucose-Capillary Latest Ref Range: 65-99 mg/dL 191 (H) 478 (H) 295 (H) 281 (H) 224 (H)    Current orders for Inpatient glycemic control: Levemir 27 units QHS, Novolog 0-20 units TID with meals  Inpatient Diabetes Program Recommendations Insulin - Basal: Fasting glucose is 224 mg/dl this morning. Please consider increasing Levemir to 30 units QHS. Insulin - Meal Coverage: Please consider ordering Novolog 5 units TID with meals for meal coverage (in addition to Novolog correction scale).  Thanks, Orlando Penner, RN, MSN, CCRN, CDE Diabetes Coordinator Inpatient Diabetes Program [phone] (Team Pager from 8am to 5pm) [phone] ([facility] office) [phone] [facility] office) [phone] [facility] office)

## 2015-04-21 NOTE — Plan of Care (Signed)
Problem: Discharge Progression Outcomes Goal: Other Discharge Outcomes/Goals Outcome: Progressing Plan of care progress to goal: VSS Pt refuses to turn every 2 hours r/t severe pain. Pt remains in bed. Pt concerned about upcoming CT results.

## 2015-04-21 NOTE — Sedation Documentation (Signed)
300 ml thick "chocolate-colored" fluid aspirated by Dr. Chilton Si.  Specimen to lab.

## 2015-04-21 NOTE — Progress Notes (Signed)
   04/21/15 1200  Clinical Encounter Type  Visited With Patient and family together  Visit Type Follow-up  Referral From Nurse  Consult/Referral To Chaplain  Spiritual Encounters  Spiritual Needs Literature  Advance Directives (For Healthcare)  Does patient have an advance directive? Yes  Copy of advanced directive(s) in chart? Yes  Provided pastoral support and facilitated HCPOA.  Was able to provide notary and witnesses.  Copy made and put into patient's file.  [employer] Dinah Lupa-pager [phone]

## 2015-04-21 NOTE — Procedures (Signed)
Informed consent was obtained. Under CT guidance, 52F drainage catheter placed in left pelvic abscess. 300 mls of thick gray fluid removed and sample sent to lab. No immediate complications.

## 2015-04-21 NOTE — Progress Notes (Signed)
Patient is scheduled for an I&D today. Per Maudie Mercury admissions coordinator at [facility] they can accept patient back on a LOG however they do not have private rooms if patient goes on isolation precautions. Per Maudie Mercury they cannot accept patient back if she is on isolation precautions. CSW met with patient and her daughter [name] was at bedside. CSW explained that patient can't return to [facility] if she is on isolation precautions. CSW also explained that if a new LOG bed is needed the search will have to extend to [place], [place], [place] and [place]. Daughter and patient verbalized their understanding. CSW will continue to follow and assist as needed.   Blima Rich, [place] [phone]

## 2015-04-21 NOTE — Plan of Care (Signed)
Problem: Discharge Progression Outcomes Goal: Other Discharge Outcomes/Goals Outcome: Progressing Patient had CT guided drainage today of possible absess  300 ml thick "chocolate-colored" fluid aspirated by Dr. Chilton Si.  Specimen to lab.  JP drain in place dressing clean dry and intact Patient is tolerating diet well  Patient c/o pain several times today relieved well with prn Morphine  IV push and percocet

## 2015-04-22 LAB — GLUCOSE, CAPILLARY
Glucose-Capillary: 87 mg/dL (ref 65–99)
Glucose-Capillary: 164 mg/dL — ABNORMAL HIGH (ref 65–99)
Glucose-Capillary: 206 mg/dL — ABNORMAL HIGH (ref 65–99)
Glucose-Capillary: 186 mg/dL — ABNORMAL HIGH (ref 65–99)

## 2015-04-22 LAB — URINE CULTURE: Culture: >=100000

## 2015-04-22 LAB — QUANTIFERON IN TUBE
QFT TB AG MINUS NIL VALUE: 0.01 [IU]/mL
QUANTIFERON MITOGEN VALUE: 0.22 [IU]/mL
QUANTIFERON TB AG VALUE: 0.06 [IU]/mL
QUANTIFERON TB GOLD: UNDETERMINED
Quantiferon Nil Value: 0.05 IU/mL

## 2015-04-22 LAB — VITAMIN D 1,25 DIHYDROXY
Vitamin D 1, 25 (OH)2 Total: 36 pg/mL
Vitamin D3 1, 25 (OH)2: 36 pg/mL

## 2015-04-22 LAB — QUANTIFERON TB GOLD ASSAY (BLOOD)

## 2015-04-22 MED ORDER — ENOXAPARIN SODIUM 40 MG/0.4ML ~~LOC~~ SOLN
40.0000 mg | SUBCUTANEOUS | Status: DC
  Administered 2015-04-22 – 2015-04-23 (×2): 40 mg via SUBCUTANEOUS
  Filled 2015-04-22 (×2): qty 0.4

## 2015-04-22 NOTE — Progress Notes (Addendum)
Per MD patient will likely be ready for D/C tomorrow and will likely not be put on isolation. Clinical Child psychotherapist (CSW) made [employer] at [facility] aware of above. CSW also made Kim aware of JP drain. CSW will continue to follow and assist as needed.   Jetta Lout, LCSWA [phone]

## 2015-04-22 NOTE — Progress Notes (Signed)
[facility] CLINIC INFECTIOUS DISEASE PROGRESS NOTE Date of Admission:  04/19/2015     ID: Destiny Cole is a 64 y.o. female with  L renal absecess  Active Problems:   Sepsis   Pressure sore   Subjective: S/p drainage yest - feels a lot better today. No fevers.   ROS  Eleven systems are reviewed and negative except per hpi  Medications:  Antibiotics Given (last 72 hours)    Date/Time Action Medication Dose Rate   04/20/15 0744 Given   cefTRIAXone (ROCEPHIN) 1 g in dextrose 5 % 50 mL IVPB 1 g 100 mL/hr   04/21/15 0646 Given   cefTRIAXone (ROCEPHIN) 1 g in dextrose 5 % 50 mL IVPB 1 g 100 mL/hr   04/22/15 0658 Given   cefTRIAXone (ROCEPHIN) 1 g in dextrose 5 % 50 mL IVPB 1 g 100 mL/hr     . aspirin  81 mg Oral Daily  . atorvastatin  40 mg Oral Daily  . busPIRone  7.5 mg Oral BID  . cefTRIAXone (ROCEPHIN)  IV  1 g Intravenous Q24H  . cholecalciferol  1,000 Units Oral Daily  . docusate sodium  100 mg Oral BID  . enoxaparin (LOVENOX) injection  40 mg Subcutaneous Q24H  . feeding supplement (PRO-STAT SUGAR FREE 64)  30 mL Oral BID AC  . FLUoxetine  20 mg Oral Daily  . insulin aspart  0-20 Units Subcutaneous TID WC & HS  . insulin detemir  30 Units Subcutaneous QHS  . niacin  500 mg Oral Daily  . omega-3 acid ethyl esters  1 g Oral Daily  . sodium chloride  3 mL Intravenous Q12H    Objective: Vital signs in last 24 hours: Temp:  [97.6 F (36.4 C)-98.7 F (37.1 C)] 98.4 F (36.9 C) (09/14 1258) Pulse Rate:  [90-101] 96 (09/14 1258) Resp:  [17-20] 20 (09/14 1258) BP: (118-150)/(52-69) 127/52 mmHg (09/14 1258) SpO2:  [91 %-93 %] 93 % (09/14 1258) Weight:  [96.253 kg (212 lb 3.2 oz)] 96.253 kg (212 lb 3.2 oz) (09/14 0547) Constitutional: oriented to person, place, and time. Obese,  HENT: [place]/AT, PERRLA, no scleral icterus Mouth/Throat: Oropharynx is clear and moist. No oropharyngeal exudate.  Cardiovascular: Normal rate, regular rhythm and normal heart sounds.   Pulmonary/Chest: Effort normal and breath sounds normal. No respiratory distress. has no wheezes.  Neck - supple, no nuchal rigidity Abdominal: Soft. L post flank with mild ttp - she has L drain in place with thick white drainage Lymphadenopathy: no cervical adenopathy. No axillary adenopathy Neurological: alert and oriented to person, place, and time.  Skin: Skin is warm and dry. No rash noted. No erythema.  Psychiatric: a normal mood and affect. behavior is normal.   Lab Results  Recent Labs  04/20/15 0429 04/21/15 0456  WBC 19.3* 15.8*  HGB 9.0* 8.2*  HCT 28.3* 25.4*  NA 138 135  K 4.0 3.6  CL 100* 100*  CO2 30 29  BUN 22* 21*  CREATININE 0.48 0.39*    Microbiology:  Results for orders placed or performed during the hospital encounter of 04/19/15  Urine culture     Status: None   Collection Time: 04/19/15  2:01 AM  Result Value Ref Range Status   Specimen Description URINE, RANDOM  Final   Special Requests Normal  Final   Culture MULTIPLE SPECIES PRESENT, SUGGEST RECOLLECTION  Final   Report Status 04/20/2015 FINAL  Final  Culture, blood (routine x 2)     Status: None (Preliminary  result)   Collection Time: 04/19/15  3:21 AM  Result Value Ref Range Status   Specimen Description BLOOD RIGHT HAND  Final   Special Requests BOTTLES DRAWN AEROBIC AND ANAEROBIC 5CC  Final   Culture NO GROWTH 3 DAYS  Final   Report Status PENDING  Incomplete  Culture, blood (routine x 2)     Status: None (Preliminary result)   Collection Time: 04/19/15  3:21 AM  Result Value Ref Range Status   Specimen Description BLOOD RIGHT WRIST  Final   Special Requests BOTTLES DRAWN AEROBIC AND ANAEROBIC 4CC  Final   Culture NO GROWTH 3 DAYS  Final   Report Status PENDING  Incomplete  MRSA PCR Screening     Status: None   Collection Time: 04/19/15  1:15 PM  Result Value Ref Range Status   MRSA by PCR NEGATIVE NEGATIVE Final    Comment:        The GeneXpert MRSA Assay (FDA approved for  NASAL specimens only), is one component of a comprehensive MRSA colonization surveillance program. It is not intended to diagnose MRSA infection nor to guide or monitor treatment for MRSA infections.   Body fluid culture     Status: None (Preliminary result)   Collection Time: 04/21/15  3:40 PM  Result Value Ref Range Status   Specimen Description PERITONEAL  Final   Special Requests NONE  Final   Gram Stain PENDING  Incomplete   Culture   Final    MODERATE GROWTH GRAM NEGATIVE RODS IDENTIFICATION AND SUSCEPTIBILITIES TO FOLLOW    Report Status PENDING  Incomplete    Studies/Results: Ct Abdomen Pelvis W Contrast  04/20/2015   CLINICAL DATA:  Left flank and upper quadrant pain for 2 months with nausea and vomiting. Urinary tract infection with leukocytosis and poorly controlled diabetes.  EXAM: CT ABDOMEN AND PELVIS WITH CONTRAST  TECHNIQUE: Multidetector CT imaging of the abdomen and pelvis was performed using the standard protocol following bolus administration of intravenous contrast.  CONTRAST:  OMNIPAQUE IOHEXOL 300 MG/ML  SOLN  COMPARISON:  None.  FINDINGS: Lower chest and abdominal wall: There are layering bilateral pleural effusions which are small. Fluid collection discussed below is in close proximity to the posterior diaphragm, but there is no complexity to suggest empyema.  Hepatobiliary: No focal liver abnormality.Numerous layering gallstones. No indication of cholecystitis.  Pancreas: Unremarkable.  Spleen: Mild granulomatous changes.  Adrenals/Urinary Tract: The left flank abnormality on previous pelvis CT reflects a multiloculated rim enhancing fluid collection which measures up to 17 x 16 x 11 cm in maximal dimension. Fluid up lifts the left kidney and is contiguous with interpolar cortical hypoenhancement, best visualized on delayed imaging. The fluid is in the perirenal, posterior para renal, and subcutaneous left flank. Given the unusual growth through the abdominal  wall, atypical infection should be considered. There is no calcification or hydronephrosis typical of renal tuberculosis. No nodular mass like areas of enhancement to suggest a cystic sarcoma. The surrounding fat and muscle is infiltrated and inflamed. No hydronephrosis. 9 mm stone the lower pole left kidney.  Unremarkable bladder.  Reproductive:Hysterectomy.  Negative adnexae.  Stomach/Bowel:  No obstruction. No appendicitis.  Vascular/Lymphatic: No acute vascular abnormality. No mass or adenopathy.  Peritoneal: No ascites or pneumoperitoneum.  Musculoskeletal: No evidence of spinal infection.  IMPRESSION: 1. The left flank mass is a 17 x 16 x 11 cm multi loculated fluid collection extending from the perirenal space through the left abdominal wall, consistent with abscess. Given unusual  growth through the abdominal wall, tuberculous abscess should also be considered. 2. Left interpolar hypo enhancement, presumed pyelonephritis, attention on follow-up imaging. 3. Nonobstructive left nephrolithiasis. 4. Small bilateral pleural effusion. The fluid collection is in close proximity to the left pleural space, but no indication of empyema. 5. Cholelithiasis.   Electronically Signed   By: Marnee Spring M.D.   On: 04/20/2015 17:09   Ct Image Guided Drainage By Percutaneous Catheter  04/21/2015   CLINICAL DATA:  Left better renal abscess.  EXAM: CT GUIDED DRAINAGE OF left pararenal ABSCESS  ANESTHESIA/SEDATION: 1.5 Mg IV Versed 50 mcg IV Fentanyl  Total Moderate Sedation Time:  25 minutes  PROCEDURE: The procedure, risks, benefits, and alternatives were explained to the patient. Questions regarding the procedure were encouraged and answered. The patient understands and consents to the procedure.  The left lower quadrant of the abdomen was prepped with chlorhexidinein a sterile fashion, and a sterile drape was applied covering the operative field. A sterile gown and sterile gloves were used for the procedure. Local  anesthesia was provided with 1% Lidocaine.  Under CT guidance, 18 gauge trocar was directed into the fluid collection which bridges the abdominal wall using anterior approach. Guidewire was placed followed by 10 French drainage catheter. 300 mL of thick gray fluid was aspirated and sample was sent to the lab. The catheter was internally and externally secured, and hooked up to JP suction bulb drainage. Appropriate dressing was applied.  COMPLICATIONS: None immediate.  FINDINGS: Large abscess seen extending from left para renal space, through the left lateral abdominal wall and into the subcutaneous tissues.  IMPRESSION: Under CT guidance, percutaneous placement of 10 French drainage catheter into complex fluid collection extending from left inferior renal space into subcutaneous tissues of left lateral flank region. 300 mL of thick gray fluid was aspirated and sample was sent to lab.   Electronically Signed   By: Lupita Raider, M.D.   On: 04/21/2015 16:27    Assessment/Plan: JRUE YAMBAO is a 64 y.o. female with cerebral palsy, obesity, poorly controlled DM (A1c 16.6) admitted with falls, L hip and back pain, stg II sacral ulcer, wt loss of 200#s, persistent leukocytosis and found to have UA with TNTC WBC, UCX mixed and CT with a L sided soft tissue mass. She reports being ill for approx several months with occas NS, chills, and the recurrent pain. Has a chronic dry cough as well. She lives in a rehab facility. Had drain placed 9/13 with 300 cc fluid removed. Cx is + for GNR.  Recommendations Likely a renal abscess. Cont ceftriaxone Check cbc in am Final abx based on culture results. Agree with IR to leave drain in for 2 weeks then reimage unless worsens.  Thank you very much for the consult. Will follow with you.  Ioannis Schuh   04/22/2015, 4:08 PM

## 2015-04-22 NOTE — Plan of Care (Signed)
Problem: Discharge Progression Outcomes Goal: Other Discharge Outcomes/Goals Outcome: Progressing Plan of care progress to goal: VSS. Pt continues to have pain, but JP drain placed on 9/13 to drain abscess has improved her anxiety. Pain controlled with morphine and tylenol. Little drainage from JP bulb suction.

## 2015-04-22 NOTE — Plan of Care (Signed)
Problem: Discharge Progression Outcomes Goal: Other Discharge Outcomes/Goals Outcome: Progressing Patient is A&O today and in general feeling much better that on 9/13 VSS JP drain intact and draining or small amount of tan milky fluid into bulb  Flushed with 10cc normal saline (input)and got 16 CC in bulb as output Pain c/o headache x1 today relieved well with prn Tylenol and left sided ABD/back pain x1 in am relieved well with 1 mg morphine IVP Bedrest discontinued

## 2015-04-22 NOTE — Progress Notes (Signed)
[facility] - [place] at [facility]   PATIENT NAME: Destiny Cole    MR#:  [id number]  DATE OF BIRTH:  [birth year]  SUBJECTIVE:  CHIEF COMPLAINT:   Chief Complaint  Patient presents with  . Chest Pain    Came with sepsis, have complain of pain in left hip. No new complains. Found to have an abscess  Around left pelvis. CT-guided drain placed with 300 ML of gray-brown liquid drained. Cultures pending. Afebrile. Hip pain improved.  REVIEW OF SYSTEMS:  CONSTITUTIONAL: No fever, have generalized fatigue or weakness.  EYES: No blurred or double vision.  EARS, NOSE, AND THROAT: No tinnitus or ear pain.  RESPIRATORY: No cough, shortness of breath, wheezing or hemoptysis.  CARDIOVASCULAR: No chest pain, orthopnea, edema.  GASTROINTESTINAL: No nausea, vomiting, diarrhea or abdominal pain.  GENITOURINARY: No dysuria, hematuria.  ENDOCRINE: No polyuria, nocturia,  HEMATOLOGY: No anemia, easy bruising or bleeding SKIN: No rash or lesion. MUSCULOSKELETAL: left hip joint pain. NEUROLOGIC: No tingling, numbness, weakness.  PSYCHIATRY: No anxiety or depression.   ROS  DRUG ALLERGIES:   Allergies  Allergen Reactions  . Promethazine     She has a possible respiratory reaction with this medication with RR 4 and hypoxia a few minutes after administration. Other respiratory depressants like benzos should be used with caution.  . Sulfa Antibiotics Swelling and Rash    VITALS:  Blood pressure 118/56, pulse 95, temperature 97.8 F (36.6 C), temperature source Oral, resp. rate 17, height  (1.676 m), weight 96.253 kg (212 lb 3.2 oz), SpO2 93 %.  PHYSICAL EXAMINATION:  GENERAL:  64 y.o.-year-old patient lying in the bed with no acute distress.  EYES: Pupils equal, round, reactive to light and accommodation. No scleral icterus. Extraocular muscles intact.  HEENT: Head atraumatic, normocephalic. Oropharynx and nasopharynx clear.  NECK:  Supple, no jugular venous  distention. No thyroid enlargement, no tenderness.  LUNGS: Normal breath sounds bilaterally, no wheezing, rales,rhonchi or crepitation. No use of accessory muscles of respiration.  CARDIOVASCULAR: S1, S2 normal. No murmurs, rubs, or gallops.  ABDOMEN: Soft, nontender, nondistended. Bowel sounds present. No organomegaly or mass. Tender on left side of hip and lower back. EXTREMITIES: No pedal edema, cyanosis, or clubbing. Drain around left hip. NEUROLOGIC: Cranial nerves II through XII are intact. Muscle strength 4/5 in left side extremities, right side extremities have weakness and contracture . Sensation intact. Gait not checked.  PSYCHIATRIC: The patient is alert and oriented x 3.  SKIN: No obvious rash, lesion, or ulcer.   Physical Exam LABORATORY PANEL:   CBC  Recent Labs Lab 04/21/15 0456  WBC 15.8*  HGB 8.2*  HCT 25.4*  PLT 602*   ------------------------------------------------------------------------------------------------------------------  Chemistries   Recent Labs Lab 04/16/15 0450  04/19/15 0201  04/21/15 0456  NA 137  < > 134*  < > 135  K 3.5  < > 4.1  < > 3.6  CL 100*  < > 96*  < > 100*  CO2 30  < > 30  < > 29  GLUCOSE 214*  < > 252*  < > 261*  BUN 23*  < > 23*  < > 21*  CREATININE 0.42*  < > 0.49  < > 0.39*  CALCIUM 8.2*  < > 8.3*  < > 7.6*  MG 1.6*  --   --   --   --   AST 18  < > 19  --   --   ALT 10*  < >  11*  --   --   ALKPHOS 183*  < > 204*  --   --   BILITOT 0.2*  < > 0.3  --   --   < > = values in this interval not displayed. ------------------------------------------------------------------------------------------------------------------  Cardiac Enzymes  Recent Labs Lab 04/19/15 1103 04/19/15 1703  TROPONINI 0.03 0.03   ------------------------------------------------------------------------------------------------------------------  RADIOLOGY:  Ct Abdomen Pelvis W Contrast  04/20/2015   CLINICAL DATA:  Left flank and upper quadrant  pain for 2 months with nausea and vomiting. Urinary tract infection with leukocytosis and poorly controlled diabetes.  EXAM: CT ABDOMEN AND PELVIS WITH CONTRAST  TECHNIQUE: Multidetector CT imaging of the abdomen and pelvis was performed using the standard protocol following bolus administration of intravenous contrast.  CONTRAST:  OMNIPAQUE IOHEXOL 300 MG/ML  SOLN  COMPARISON:  None.  FINDINGS: Lower chest and abdominal wall: There are layering bilateral pleural effusions which are small. Fluid collection discussed below is in close proximity to the posterior diaphragm, but there is no complexity to suggest empyema.  Hepatobiliary: No focal liver abnormality.Numerous layering gallstones. No indication of cholecystitis.  Pancreas: Unremarkable.  Spleen: Mild granulomatous changes.  Adrenals/Urinary Tract: The left flank abnormality on previous pelvis CT reflects a multiloculated rim enhancing fluid collection which measures up to 17 x 16 x 11 cm in maximal dimension. Fluid up lifts the left kidney and is contiguous with interpolar cortical hypoenhancement, best visualized on delayed imaging. The fluid is in the perirenal, posterior para renal, and subcutaneous left flank. Given the unusual growth through the abdominal wall, atypical infection should be considered. There is no calcification or hydronephrosis typical of renal tuberculosis. No nodular mass like areas of enhancement to suggest a cystic sarcoma. The surrounding fat and muscle is infiltrated and inflamed. No hydronephrosis. 9 mm stone the lower pole left kidney.  Unremarkable bladder.  Reproductive:Hysterectomy.  Negative adnexae.  Stomach/Bowel:  No obstruction. No appendicitis.  Vascular/Lymphatic: No acute vascular abnormality. No mass or adenopathy.  Peritoneal: No ascites or pneumoperitoneum.  Musculoskeletal: No evidence of spinal infection.  IMPRESSION: 1. The left flank mass is a 17 x 16 x 11 cm multi loculated fluid collection extending  from the perirenal space through the left abdominal wall, consistent with abscess. Given unusual growth through the abdominal wall, tuberculous abscess should also be considered. 2. Left interpolar hypo enhancement, presumed pyelonephritis, attention on follow-up imaging. 3. Nonobstructive left nephrolithiasis. 4. Small bilateral pleural effusion. The fluid collection is in close proximity to the left pleural space, but no indication of empyema. 5. Cholelithiasis.   Electronically Signed   By: Marnee Spring M.D.   On: 04/20/2015 17:09   Ct Image Guided Drainage By Percutaneous Catheter  04/21/2015   CLINICAL DATA:  Left better renal abscess.  EXAM: CT GUIDED DRAINAGE OF left pararenal ABSCESS  ANESTHESIA/SEDATION: 1.5 Mg IV Versed 50 mcg IV Fentanyl  Total Moderate Sedation Time:  25 minutes  PROCEDURE: The procedure, risks, benefits, and alternatives were explained to the patient. Questions regarding the procedure were encouraged and answered. The patient understands and consents to the procedure.  The left lower quadrant of the abdomen was prepped with chlorhexidinein a sterile fashion, and a sterile drape was applied covering the operative field. A sterile gown and sterile gloves were used for the procedure. Local anesthesia was provided with 1% Lidocaine.  Under CT guidance, 18 gauge trocar was directed into the fluid collection which bridges the abdominal wall using anterior approach. Guidewire was placed followed by  10 French drainage catheter. 300 mL of thick gray fluid was aspirated and sample was sent to the lab. The catheter was internally and externally secured, and hooked up to JP suction bulb drainage. Appropriate dressing was applied.  COMPLICATIONS: None immediate.  FINDINGS: Large abscess seen extending from left para renal space, through the left lateral abdominal wall and into the subcutaneous tissues.  IMPRESSION: Under CT guidance, percutaneous placement of 10 French drainage catheter into  complex fluid collection extending from left inferior renal space into subcutaneous tissues of left lateral flank region. 300 mL of thick gray fluid was aspirated and sample was sent to lab.   Electronically Signed   By: Lupita Raider, M.D.   On: 04/21/2015 16:27    ASSESSMENT AND PLAN:   1. Sepsis:  With pelvic abscess His most drainage. Cultures have gram-negative rods. Patient is on ceftriaxone. We will wait for final culture results.  2. Urinary tract infection:  On IV antibiotics. Cultures negative.  3. Elevated troponin likely from demand ischemia. Stable. No chest pain.  4. Diabetes mellitus type 2: Continue basal insulin dosing adjusted for hospital diet. Place patient on sliding scale insulin as well.  5. Hyperlipidemia: Continue statin therapy.  6. Depression: Continue buspirone and fluoxetine  7. DVT prophylaxis: Heparin  All the records are reviewed and case discussed with Care Management/Social Workerr. Management plans discussed with the patient, family and they are in agreement.  CODE STATUS: DNR  TOTAL TIME TAKING CARE OF THIS PATIENT: 40 minutes.    POSSIBLE D/C IN 1-2 DAYS, DEPENDING ON CLINICAL CONDITION.  Discharge once cultures from drain available.   Milagros Loll R M.D on 04/22/2015   Between 7am to 6pm - Pager - [phone]  After 6pm go to [email or web address] - password [email or web address] [facility]  [facility]  Office  [phone]  CC: Primary care physician; Egbert Garibaldi, NP

## 2015-04-22 NOTE — Progress Notes (Signed)
Interventional Radiology Progress Note    64 year old female admit with flank pain, evidence of UTI, and subsequent CT imaging revealing multi-spacial abscess of the left perirenal, retroperitoneal, abdominal wall, and subcutaneous tissues.    63F drain placed 04/21/2015 with 300cc of purulent fluid aspirated for Cx.    No recorded output in the chart today.    Patient reports ongoing, unchanged-since-drain 7/10 flank pain.  No fevers or chills overnight.    Discussed the need for continued drainage and re-assessment in approximately 2 weeks.  Repeating CT before 2 weeks likely will not change management.  Once selected Abx have been initiated after culture finalization, would allow 2 weeks to monitor improvement of retroperitoneal component.  If clinical status changes with deterioration, ongoing chills/rigors/fevers and leukocytosis, additional drain may be required.    Patient understands need for drain maintenance, though states that will need assistance given decreased faculties of right hand.    Recommendations:  - Do not submerge - Follow up culture - Twice a day saline 10cc flushes of drain - Repeat CT scan no sooner than 2 weeks (as above) as long as no clinical deterioration. - May need analgesics/NSAIDs for flank pain, which is most likely not related to the drain but instead the infection.   Signed,  Yvone Neu. Loreta Ave, DO

## 2015-04-23 ENCOUNTER — Inpatient Hospital Stay: Payer: Medicaid Other

## 2015-04-23 LAB — CYTOLOGY - NON PAP

## 2015-04-23 LAB — GLUCOSE, CAPILLARY
Glucose-Capillary: 72 mg/dL (ref 65–99)
Glucose-Capillary: 150 mg/dL — ABNORMAL HIGH (ref 65–99)
Glucose-Capillary: 176 mg/dL — ABNORMAL HIGH (ref 65–99)
Glucose-Capillary: 199 mg/dL — ABNORMAL HIGH (ref 65–99)

## 2015-04-23 LAB — C DIFFICILE QUICK SCREEN W PCR REFLEX
C DIFFICLE (CDIFF) ANTIGEN: NEGATIVE
C Diff interpretation: NEGATIVE
C Diff toxin: NEGATIVE

## 2015-04-23 LAB — CBC
HCT: 26.2 % — ABNORMAL LOW (ref 35.0–47.0)
Hemoglobin: 8.2 g/dL — ABNORMAL LOW (ref 12.0–16.0)
MCH: 28.9 pg (ref 26.0–34.0)
MCHC: 31.4 g/dL — ABNORMAL LOW (ref 32.0–36.0)
MCV: 91.9 fL (ref 80.0–100.0)
PLATELETS: 617 10*3/uL — AB (ref 150–440)
RBC: 2.85 MIL/uL — AB (ref 3.80–5.20)
RDW: 14.5 % (ref 11.5–14.5)
WBC: 13.9 10*3/uL — AB (ref 3.6–11.0)

## 2015-04-23 MED ORDER — DOCUSATE SODIUM 100 MG PO CAPS: 100.0000 mg | ORAL_CAPSULE | Freq: Two times a day (BID) | ORAL | Status: DC | PRN

## 2015-04-23 MED ORDER — OXYCODONE-ACETAMINOPHEN 5-325 MG PO TABS: 1.0000 | ORAL_TABLET | Freq: Four times a day (QID) | ORAL | Status: DC | PRN

## 2015-04-23 NOTE — Progress Notes (Addendum)
Per MD cultures are still pending and patient will likely be ready tomorrow 04/24/15. MD also reported that patient will need a PICC line and will require 1 IV antibiotic.Clinical Child psychotherapist (CSW) made [employer] [facility] aware of above. CSW also contacted patient's daughter Amil Amen and made her aware of above. CSW will continue to follow and assist as needed.   Jetta Lout, LCSWA [phone]

## 2015-04-23 NOTE — Plan of Care (Signed)
Problem: Discharge Progression Outcomes Goal: Other Discharge Outcomes/Goals Outcome: Progressing Plan of care progress to goal:  Patient c/o left sided pain radiating towards back, relief noted with pain medication. JP in place - draining with no complications. Cardiac monitoring continues. [facility] called to have PICC placed for 2 weeks of outpatient antibiotics. Possible discharge tomorrow.

## 2015-04-23 NOTE — Progress Notes (Signed)
ID E note Pt remains afebrile. WBC decreased. Culture still pending but growing GNR  Rec At this point would place picc (ordered) and plan on 2 weeks IV abx (to be based on culture results but likely ceftraixone since clinically responding to this) Will plan reimage per IR in 2 weeks and base further abx recs on results of imaging.

## 2015-04-23 NOTE — Progress Notes (Addendum)
Nutrition Follow-up  INTERVENTION:   Meals and Snacks: Cater to patient preferences Education: Spoke with pt regarding diabetic nutrition therapy. Pt able to recite counting carbohydrates to writer and provide multiple examples of carbohydrate rich foods. Pt c/o of foods often served at [employer] for starchy vegetables. RD provided pt with 'Plate Method' way to observe and monitor carbohydrate intake at meals with portions. Pt reports her blood sugars are often >200 at [facility] even fasting, but monitors daily. Expect good compliance.    NUTRITION DIAGNOSIS:   Increased nutrient needs related to wound healing as evidenced by estimated needs.  GOAL:   Patient will meet greater than or equal to 90% of their needs  MONITOR:    (Energy Intake, Electrolyte and renal Profile, Glucose Profile, Digestive System, Skin)   ASSESSMENT:   Pt admitted with sepsis secondary to UTI. Pt with h/o cerebral palsy and stage II pressure ulcers. Noted pt recently admitted to [facility] per daughter with DKA and multiple stage II pressure ulcers.   Diet Order:  Diet Heart Room service appropriate?: Yes; Fluid consistency:: Thin    Current Nutrition: Pt reports eating cereal with coffee this am and ate 100% of hamburger last night. Tolerating meals well.    Medications: Colace, Prostat, Levemir, Omega-3  Electrolyte/Renal Profile and Glucose Profile:   Recent Labs Lab 04/19/15 0201 04/20/15 0429 04/21/15 0456  NA 134* 138 135  K 4.1 4.0 3.6  CL 96* 100* 100*  CO2 BUN 23* 22* 21*  CREATININE 0.49 0.48 0.39*  CALCIUM 8.3* 8.1* 7.6*  GLUCOSE 252* 183* 261*   Protein Profile:   Recent Labs Lab 04/18/15 1850 04/19/15 0201  ALBUMIN 1.4* 1.5*    Gastrointestinal Profile: Last BM: 04/23/2015  Filed Weights   04/21/15 0500 04/22/15 0547 04/23/15 0503  Weight: 222 lb 8 oz (100.925 kg) 212 lb 3.2 oz (96.253 kg) 219 lb 4.8 oz (99.474 kg)     BMI:  Body  mass index is 35.41 kg/(m^2).  Estimated Nutritional Needs:   Kcal:  1668-1971kcals, BEE: 1167kcals, TEE: (IF 1.1-1.3)(AF 1.3) using IBW of 59kg  Protein:  70-88g protein (1.2-1.5g/kg) using IBW of 59kg  Fluid:  1475-1798mL of fluid (25-27mL/kg) using IBW of 59kg  EDUCATION NEEDS:   Education needs addressed  LOW Care Level  Leda Quail, RD, LDN Pager [phone]

## 2015-04-23 NOTE — Progress Notes (Signed)
[facility] - [place] at [facility]   PATIENT NAME: Destiny Cole    MR#:  [id number]  DATE OF BIRTH:  06/08/1951  SUBJECTIVE:  CHIEF COMPLAINT:   Chief Complaint  Patient presents with  . Chest Pain    Came with sepsis, have complain of pain in left hip. No new complains. Found to have an abscess  Around left pelvis. CT-guided drain placed with 300 ML of gray-brown liquid drained. Cultures pending. Afebrile. Hip pain slowly improving  REVIEW OF SYSTEMS:  CONSTITUTIONAL: No fever, have generalized fatigue or weakness.  EYES: No blurred or double vision.  EARS, NOSE, AND THROAT: No tinnitus or ear pain.  RESPIRATORY: No cough, shortness of breath, wheezing or hemoptysis.  CARDIOVASCULAR: No chest pain, orthopnea, edema.  GASTROINTESTINAL: No nausea, vomiting, diarrhea or abdominal pain.  GENITOURINARY: No dysuria, hematuria.  ENDOCRINE: No polyuria, nocturia,  HEMATOLOGY: No anemia, easy bruising or bleeding SKIN: No rash or lesion. MUSCULOSKELETAL: left hip joint pain. NEUROLOGIC: No tingling, numbness, weakness.  PSYCHIATRY: No anxiety or depression.   ROS  DRUG ALLERGIES:   Allergies  Allergen Reactions  . Promethazine     She has a possible respiratory reaction with this medication with RR 4 and hypoxia a few minutes after administration. Other respiratory depressants like benzos should be used with caution.  . Sulfa Antibiotics Swelling and Rash    VITALS:  Blood pressure 125/74, pulse 96, temperature 98.7 F (37.1 C), temperature source Oral, resp. rate 20, height 5\' 6"  (1.676 m), weight 99.474 kg (219 lb 4.8 oz), SpO2 93 %.  PHYSICAL EXAMINATION:  GENERAL:  64 y.o.-year-old patient lying in the bed with no acute distress.  EYES: Pupils equal, round, reactive to light and accommodation. No scleral icterus. Extraocular muscles intact.  HEENT: Head atraumatic, normocephalic. Oropharynx and nasopharynx clear.  NECK:  Supple, no jugular  venous distention. No thyroid enlargement, no tenderness.  LUNGS: Normal breath sounds bilaterally, no wheezing, rales,rhonchi or crepitation. No use of accessory muscles of respiration.  CARDIOVASCULAR: S1, S2 normal. No murmurs, rubs, or gallops.  ABDOMEN: Soft, nontender, nondistended. Bowel sounds present. No organomegaly or mass. Tender on left side of hip and lower back. EXTREMITIES: No pedal edema, cyanosis, or clubbing. Drain around left hip. NEUROLOGIC: Cranial nerves II through XII are intact. Muscle strength 4/5 in left side extremities, right side extremities have weakness and contracture . Sensation intact. Gait not checked.  PSYCHIATRIC: The patient is alert and oriented x 3.  SKIN: No obvious rash, lesion, or ulcer.   Physical Exam LABORATORY PANEL:   CBC  Recent Labs Lab 04/23/15 0458  WBC 13.9*  HGB 8.2*  HCT 26.2*  PLT 617*   ------------------------------------------------------------------------------------------------------------------  Chemistries   Recent Labs Lab 04/19/15 0201  04/21/15 0456  NA 134*  < > 135  K 4.1  < > 3.6  CL 96*  < > 100*  CO2 30  < > 29  GLUCOSE 252*  < > 261*  BUN 23*  < > 21*  CREATININE 0.49  < > 0.39*  CALCIUM 8.3*  < > 7.6*  AST 19  --   --   ALT 11*  --   --   ALKPHOS 204*  --   --   BILITOT 0.3  --   --   < > = values in this interval not displayed. ------------------------------------------------------------------------------------------------------------------  Cardiac Enzymes  Recent Labs Lab 04/19/15 1103 04/19/15 1703  TROPONINI 0.03 0.03   ------------------------------------------------------------------------------------------------------------------  RADIOLOGY:  Ct Image Guided Drainage By Percutaneous Catheter  04/21/2015   CLINICAL DATA:  Left better renal abscess.  EXAM: CT GUIDED DRAINAGE OF left pararenal ABSCESS  ANESTHESIA/SEDATION: 1.5 Mg IV Versed 50 mcg IV Fentanyl  Total Moderate Sedation  Time:  25 minutes  PROCEDURE: The procedure, risks, benefits, and alternatives were explained to the patient. Questions regarding the procedure were encouraged and answered. The patient understands and consents to the procedure.  The left lower quadrant of the abdomen was prepped with chlorhexidinein a sterile fashion, and a sterile drape was applied covering the operative field. A sterile gown and sterile gloves were used for the procedure. Local anesthesia was provided with 1% Lidocaine.  Under CT guidance, 18 gauge trocar was directed into the fluid collection which bridges the abdominal wall using anterior approach. Guidewire was placed followed by 10 French drainage catheter. 300 mL of thick gray fluid was aspirated and sample was sent to the lab. The catheter was internally and externally secured, and hooked up to JP suction bulb drainage. Appropriate dressing was applied.  COMPLICATIONS: None immediate.  FINDINGS: Large abscess seen extending from left para renal space, through the left lateral abdominal wall and into the subcutaneous tissues.  IMPRESSION: Under CT guidance, percutaneous placement of 10 French drainage catheter into complex fluid collection extending from left inferior renal space into subcutaneous tissues of left lateral flank region. 300 mL of thick gray fluid was aspirated and sample was sent to lab.   Electronically Signed   By: Lupita Raider, M.D.   On: 04/21/2015 16:27    ASSESSMENT AND PLAN:   1. Sepsis:  With pelvic abscess s/p drainage. Cultures have gram-negative rods. Patient is on ceftriaxone. We will wait for final culture results. Get PICC for IV abx 2 weeks as OP  2. Urinary tract infection:  On IV antibiotics. Cultures negative.  3. Elevated troponin likely from demand ischemia. Stable. No chest pain.  4. Diabetes mellitus type 2: Continue basal insulin dosing adjusted for hospital diet. Place patient on sliding scale insulin as well.  5. Hyperlipidemia:  Continue statin therapy.  6. Depression: Continue buspirone and fluoxetine  7. DVT prophylaxis: Heparin  All the records are reviewed and case discussed with Care Management/Social Workerr. Management plans discussed with the patient, family and they are in agreement.  CODE STATUS: DNR  TOTAL TIME TAKING CARE OF THIS PATIENT: 40 minutes.   POSSIBLE D/C TOMORROW DEPENDING ON CLINICAL CONDITION. DISCUSSED WITH SW.   Milagros Loll R M.D on 04/23/2015   Between 7am to 6pm - Pager - [phone]  After 6pm go to [email or web address] - password [email or web address] [facility]  [facility]  Office  [phone]  CC: Primary care physician; Egbert Garibaldi, NP

## 2015-04-23 NOTE — Plan of Care (Signed)
Problem: Discharge Progression Outcomes Goal: Other Discharge Outcomes/Goals Outcome: Progressing Plan of care progress to goal: Pain: Percocet and Morphine given for c/o left abdominal pain that radiates to back. Pt able to rest and get some sleep after pain medication administration. Hemodynamics: Pt with JP drain to left abdomen. Irrigated with 10 mls per MD order around 2200. This am approximately 8 mls of tan drainage was discarded. Activity: Pt from snf. Pt has been using the bedpan with no desire to get OOB. Pt does have an order for Physical Therapy. Pt needs to be encouraged/motivated to get OOB and get moving. Pt for possible discharge tomorrow back to the [facility] [place].

## 2015-04-23 NOTE — Discharge Instructions (Addendum)
°  DIET:  Diabetic diet  DISCHARGE CONDITION:  Stable  ACTIVITY:  Activity as tolerated  OXYGEN:  Home Oxygen: No.   Oxygen Delivery: room air  DISCHARGE LOCATION:  nursing home   If you experience worsening of your admission symptoms, develop shortness of breath, life threatening emergency, suicidal or homicidal thoughts you must seek medical attention immediately by calling 911 or calling your MD immediately  if symptoms less severe.  You Must read complete instructions/literature along with all the possible adverse reactions/side effects for all the Medicines you take and that have been prescribed to you. Take any new Medicines after you have completely understood and accpet all the possible adverse reactions/side effects.   Please note  You were cared for by a hospitalist during your hospital stay. If you have any questions about your discharge medications or the care you received while you were in the hospital after you are discharged, you can call the unit and asked to speak with the hospitalist on call if the hospitalist that took care of you is not available. Once you are discharged, your primary care physician will handle any further medical issues. Please note that NO REFILLS for any discharge medications will be authorized once you are discharged, as it is imperative that you return to your primary care physician (or establish a relationship with a primary care physician if you do not have one) for your aftercare needs so that they can reassess your need for medications and monitor your lab values.  Follow up with Dr. Ardelle Anton - Interventional radiology within 1 week for JP drain.  You need CT scan of Abdomen and pelvis with contrast in 2 weeks to follow up on your abcsess.  Ceftriaxone for 12 days starting 04/25/2015.

## 2015-04-23 NOTE — Progress Notes (Signed)
PT Cancellation Note  Patient Details Name: Destiny Cole MRN: [id number] DOB: Nov 03, 1950   Cancelled Treatment:    Reason Eval/Treat Not Completed: Patient declined, no reason specified (Pt reports she just received morphine in preparation for PICC line and did not want to move (although she promised to get up with nursing later today after PICC line placement).)  Will re-attempt PT treatment session at a later date/time as able.   [name] 04/23/2015, 4:22 PM [name], PT [phone]

## 2015-04-24 LAB — GLUCOSE, CAPILLARY
Glucose-Capillary: 124 mg/dL — ABNORMAL HIGH (ref 65–99)
Glucose-Capillary: 82 mg/dL (ref 65–99)
Glucose-Capillary: 125 mg/dL — ABNORMAL HIGH (ref 65–99)
Glucose-Capillary: 154 mg/dL — ABNORMAL HIGH (ref 65–99)

## 2015-04-24 LAB — CULTURE, BLOOD (ROUTINE X 2)
CULTURE: NO GROWTH
Culture: NO GROWTH

## 2015-04-24 MED ORDER — LOPERAMIDE HCL 2 MG PO CAPS
2.0000 mg | ORAL_CAPSULE | ORAL | Status: DC | PRN
  Administered 2015-04-24: 2 mg via ORAL
  Filled 2015-04-24: qty 1

## 2015-04-24 MED ORDER — LOPERAMIDE HCL 2 MG PO CAPS: 2.0000 mg | ORAL_CAPSULE | ORAL | Status: DC | PRN

## 2015-04-24 MED ORDER — DEXTROSE 5 % IV SOLN: 1.0000 g | INTRAVENOUS | Status: DC

## 2015-04-24 MED ORDER — LOPERAMIDE HCL 2 MG PO CAPS
4.0000 mg | ORAL_CAPSULE | Freq: Once | ORAL | Status: AC
  Administered 2015-04-24: 4 mg via ORAL
  Filled 2015-04-24: qty 2

## 2015-04-24 NOTE — Progress Notes (Signed)
Patient discharged to [facility] via EMS. Brewer,Kimberly S, RN  

## 2015-04-24 NOTE — Progress Notes (Signed)
Patient is medically stable for D/C to [facility] today. Per Kim admissions coordinator at [facility] patient is going to room 304-B. RN will call report at [phone] and arrange EMS for transport. Zack clinical social work Chiropodist approved a new 30 day LOG. Clinical Child psychotherapist (CSW) faxed LOG request to [place] today. CSW prepared D/C packet and sent D/C Summary to Kim via carefinder. Patient is aware of above. CSW contacted patient's daughter Amil Amen and made her aware of above. Please reconsult if future social work needs arise. CSW signing off.   Jetta Lout, LCSWA [phone]

## 2015-04-24 NOTE — Discharge Summary (Signed)
[place] [facility] [place] NAME: Destiny Cole    MR#:  [id number]  DATE OF BIRTH:  October 01, 1950  DATE OF ADMISSION:  04/19/2015 ADMITTING PHYSICIAN: Harrie Foreman, MD  DATE OF DISCHARGE: No discharge date for patient encounter.  PRIMARY CARE PHYSICIAN: Imelda Pillow, NP    ADMISSION DIAGNOSIS:  Leukocytosis [D72.829] UTI (lower urinary tract infection) [N39.0] Sepsis, due to unspecified organism [A41.9] Chest pain, unspecified chest pain type [R07.9]  DISCHARGE DIAGNOSIS:  Active Problems:   Sepsis   Pressure sore   SECONDARY DIAGNOSIS:   Past Medical History  Diagnosis Date  . Diabetes mellitus without complication   . Cerebral palsy   . Hypertension   . CHF (congestive heart failure)      ADMITTING HISTORY  Chief Complaint: Chest pain HPI: Patient presents to the hospital from her rehabilitation facility following a recent admission for DKA and mechanical falls. Past medical history significant for cerebral palsy and diabetes mellitus type 2. Tonight the patient states that her chest has been hurting intermittently for 3 days. Her chest primarily hurts when nursing home staff is trying to move her or when she is ambulating by leaning on the handles of her walker. The patient also complains of abdominal pain. She states on her last admission doctors explained that she had a "pelvic infection". Notably the patient denies sexual activity. Laboratory evaluation in the emergency room tonight shows urinary tract infection. The patient admits to feeling chills as well as sweats. She admits to "dry heaves"for at least 2 days as well. The patient has been taking Levaquin for 2 days and had been on oxygen by nasal cannula at the rehabilitation facility although she denies cough or shortness of breath. Due to her myriad complaints and comorbidities emergency department staff called for admission.   HOSPITAL COURSE:   1. Sepsis:   With pelvic abscess s/p drainage. PICC placed 04/23/2015. Bcx negative. 300 ml drained from pelvic area. On ceftriaxone for 2 weeks. E coli pansensitive on wound cx.  2. Urinary tract infection:  On IV antibiotics. Cultures negative.  3. Elevated troponin likely from demand ischemia. Stable. No chest pain.  4. Diabetes mellitus type 2: Continue basal insulin dosing adjusted for hospital diet. Place patient on sliding scale insulin as well.  5. Hyperlipidemia: Continue statin therapy.  6. Depression: Continue buspirone and fluoxetine  7. DVT prophylaxis: Heparin  Stable for d/c home.  F/u with IR, ID.  Repeat Ct abd/pelvis with cont in 2 weeks.    CONSULTS OBTAINED:  Treatment Team:  Jeanie Cooks, MD Adrian Prows, MD  DRUG ALLERGIES:   Allergies  Allergen Reactions  . Promethazine     She has a possible respiratory reaction with this medication with RR 4 and hypoxia a few minutes after administration. Other respiratory depressants like benzos should be used with caution.  . Sulfa Antibiotics Swelling and Rash    DISCHARGE MEDICATIONS:   Current Discharge Medication List    START taking these medications   Details  cefTRIAXone 1 g in dextrose 5 % 50 mL Inject 1 g into the vein daily. Qty: 12 Units, Refills: 0    loperamide (IMODIUM) 2 MG capsule Take 1 capsule (2 mg total) by mouth as needed for diarrhea or loose stools. Qty: 30 capsule, Refills: 0    oxyCODONE-acetaminophen (PERCOCET/ROXICET) 5-325 MG per tablet Take 1 tablet by mouth every 6 (six) hours as needed for moderate pain. Qty: 30 tablet, Refills:  0      CONTINUE these medications which have NOT CHANGED   Details  aspirin 81 MG chewable tablet Chew 81 mg by mouth daily.    atorvastatin (LIPITOR) 40 MG tablet Take 40 mg by mouth daily.    busPIRone (BUSPAR) 7.5 MG tablet Take 1 tablet (7.5 mg total) by mouth 2 (two) times daily. Qty: 30 tablet, Refills: 0    Cholecalciferol (VITAMIN  D3 PO) Take 1 tablet by mouth daily. 100 units/tablet    FLUoxetine (PROZAC) 20 MG capsule Take 1 capsule (20 mg total) by mouth daily. Qty: 30 capsule, Refills: 0    Inositol Niacinate (NO FLUSH NIACIN) 500 MG TABS Take 1 tablet by mouth daily.    insulin aspart (NOVOLOG) cartridge Correction coverage: Sensitive (thin, NPO, renal)     CBG < 70: implement hypoglycemia protocol    CBG 70 - 120: 0 units    CBG 121 - 150: 1 unit    CBG 151 - 200: 2 units    CBG 201 - 250: 3 units    CBG 251 - 300: 5 units    CBG 301 - 350: 7 units    CBG 351 - 400 9 units    CBG > 400 call MD and obtain STAT lab verification Qty: 15 mL, Refills: 1    insulin detemir (LEVEMIR) 100 UNIT/ML injection Inject 36 Units into the skin at bedtime.    Neo-Bacit-Poly-Lidocaine (FIRST AID PLUS LIDOCAINE EX) Apply 1 application topically as needed (apply to broken skin areas).    Omega-3 Fatty Acids (FISH OIL) 1200 MG CAPS Take 1 capsule by mouth daily.    ondansetron (ZOFRAN) 4 MG tablet Take 1 tablet (4 mg total) by mouth every 8 (eight) hours as needed for nausea. Qty: 20 tablet, Refills: 0    pantoprazole (PROTONIX) 40 MG tablet Take 1 tablet (40 mg total) by mouth daily. Qty: 30 tablet, Refills: 0    potassium chloride (K-DUR) 10 MEQ tablet Take 1 tablet (10 mEq total) by mouth daily. Qty: 30 tablet, Refills: 0      STOP taking these medications     levofloxacin (LEVAQUIN) 500 MG tablet          Today    VITAL SIGNS:  Blood pressure 154/66, pulse 86, temperature 98 F (36.7 C), temperature source Oral, resp. rate 18, height 5' 6"  (1.676 m), weight 99.565 kg (219 lb 8 oz), SpO2 93 %.  I/O:   Intake/Output Summary (Last 24 hours) at 04/24/15 1012 Last data filed at 04/24/15 0727  Gross per 24 hour  Intake    140 ml  Output    462 ml  Net   -322 ml    PHYSICAL EXAMINATION:  Physical Exam  GENERAL:  64 y.o.-year-old patient lying in the bed with no acute distress.  Obese LUNGS: Normal breath sounds bilaterally, no wheezing, rales,rhonchi or crepitation. No use of accessory muscles of respiration.  CARDIOVASCULAR: S1, S2 normal. No murmurs, rubs, or gallops.  ABDOMEN: Soft, non-tender, non-distended. Bowel sounds present. No organomegaly or mass.  NEUROLOGIC: Moves all 4 extremities. PSYCHIATRIC: The patient is alert and oriented x 3.  SKIN: No obvious rash, lesion, or ulcer.  JP drain left flank. Milky fluid.  DATA REVIEW:   CBC  Recent Labs Lab 04/23/15 0458  WBC 13.9*  HGB 8.2*  HCT 26.2*  PLT 617*    Chemistries   Recent Labs Lab 04/19/15 0201  04/21/15 0456  NA 134*  < > 135  K 4.1  < > 3.6  CL 96*  < > 100*  CO2 30  < > 29  GLUCOSE 252*  < > 261*  BUN 23*  < > 21*  CREATININE 0.49  < > 0.39*  CALCIUM 8.3*  < > 7.6*  AST 19  --   --   ALT 11*  --   --   ALKPHOS 204*  --   --   BILITOT 0.3  --   --   < > = values in this interval not displayed.  Cardiac Enzymes  Recent Labs Lab 04/19/15 1703  TROPONINI 0.03    Microbiology Results  Results for orders placed or performed during the hospital encounter of 04/19/15  Urine culture     Status: None   Collection Time: 04/19/15  2:01 AM  Result Value Ref Range Status   Specimen Description URINE, RANDOM  Final   Special Requests Normal  Final   Culture MULTIPLE SPECIES PRESENT, SUGGEST RECOLLECTION  Final   Report Status 04/20/2015 FINAL  Final  Culture, blood (routine x 2)     Status: None   Collection Time: 04/19/15  3:21 AM  Result Value Ref Range Status   Specimen Description BLOOD RIGHT HAND  Final   Special Requests BOTTLES DRAWN AEROBIC AND ANAEROBIC 5CC  Final   Culture NO GROWTH 5 DAYS  Final   Report Status 04/24/2015 FINAL  Final  Culture, blood (routine x 2)     Status: None   Collection Time: 04/19/15  3:21 AM  Result Value Ref Range Status   Specimen Description BLOOD RIGHT WRIST  Final   Special Requests BOTTLES DRAWN AEROBIC AND ANAEROBIC 4CC   Final   Culture NO GROWTH 5 DAYS  Final   Report Status 04/24/2015 FINAL  Final  MRSA PCR Screening     Status: None   Collection Time: 04/19/15  1:15 PM  Result Value Ref Range Status   MRSA by PCR NEGATIVE NEGATIVE Final    Comment:        The GeneXpert MRSA Assay (FDA approved for NASAL specimens only), is one component of a comprehensive MRSA colonization surveillance program. It is not intended to diagnose MRSA infection nor to guide or monitor treatment for MRSA infections.   Body fluid culture     Status: None (Preliminary result)   Collection Time: 04/21/15  3:40 PM  Result Value Ref Range Status   Specimen Description PERITONEAL  Final   Special Requests NONE  Final   Gram Stain MODERATE WBC SEEN FEW GRAM NEGATIVE RODS   Final   Culture MODERATE GROWTH ESCHERICHIA COLI  Final   Report Status PENDING  Incomplete   Organism ID, Bacteria ESCHERICHIA COLI  Final      Susceptibility   Escherichia coli - MIC*    AMPICILLIN >=32 RESISTANT Resistant     CEFAZOLIN <=4 SENSITIVE Sensitive     CEFTRIAXONE <=1 SENSITIVE Sensitive     CIPROFLOXACIN <=0.25 SENSITIVE Sensitive     GENTAMICIN <=1 SENSITIVE Sensitive     IMIPENEM <=0.25 SENSITIVE Sensitive     NITROFURANTOIN <=16 SENSITIVE Sensitive     TRIMETH/SULFA <=20 SENSITIVE Sensitive     * MODERATE GROWTH ESCHERICHIA COLI  C difficile quick scan w PCR reflex     Status: None   Collection Time: 04/23/15  8:30 PM  Result Value Ref Range Status   C Diff antigen NEGATIVE NEGATIVE Final   C Diff toxin NEGATIVE NEGATIVE Final   C  Diff interpretation Negative for C. difficile  Final    RADIOLOGY:  Dg Chest Port 1 View  04/23/2015   CLINICAL DATA:  Central catheter placement  EXAM: PORTABLE CHEST - 1 VIEW  COMPARISON:  Study obtained earlier in the day  FINDINGS: Central catheter tip is in the superior vena cava slightly proximal to the cavoatrial junction. No pneumothorax. There is interstitial edema in both mid and lower  lung zones. There is no airspace consolidation. Heart is borderline enlarged with mild pulmonary venous hypertension. No adenopathy.  IMPRESSION: Central catheter tip in superior vena cava. No pneumothorax. Evidence of a degree of congestive heart failure. No change in the appearance of the lungs and cardiac silhouette compared to earlier in the day.   Electronically Signed   By: Lowella Grip III M.D.   On: 04/23/2015 19:19   Dg Chest Port 1 View  04/23/2015   CLINICAL DATA:  Patient status post PICC line placement.  EXAM: PORTABLE CHEST - 1 VIEW  COMPARISON:  Chest radiograph 04/19/2015  FINDINGS: Interval insertion of left upper extremity PICC line visualized to the level of the right atrium, tip is not clearly delineated on current evaluation. Stable cardiac and mediastinal contours. Re- demonstrated heterogeneous opacities left mid lower lung as well as peripheral right mid lung. No definite pleural effusion.  IMPRESSION: Interval insertion left upper extremity PICC line which is visualized to the level of the right atrium, the tip is not clearly delineated on current evaluation. Consider correlation with PA and lateral chest radiograph.  Re- demonstrated left mid and lower lung as well as right mid lung opacities which may represent edema or infection.   Electronically Signed   By: Lovey Newcomer M.D.   On: 04/23/2015 18:46      Follow up with PCP in 1 week.  Management plans discussed with the patient, family and they are in agreement.  CODE STATUS:     Code Status Orders        Start     Ordered   04/19/15 0517  Do not attempt resuscitation (DNR)   Continuous    Question Answer Comment  In the event of cardiac or respiratory ARREST Do not call a "code blue"   In the event of cardiac or respiratory ARREST Do not perform Intubation, CPR, defibrillation or ACLS   In the event of cardiac or respiratory ARREST Use medication by any route, position, wound care, and other measures to relive  pain and suffering. May use oxygen, suction and manual treatment of airway obstruction as needed for comfort.      04/19/15 0516      TOTAL TIME TAKING CARE OF THIS PATIENT ON DAY OF DISCHARGE: more than 30 minutes.    Hillary Bow R M.D on 04/24/2015 at 10:12 AM  Between 7am to 6pm - Pager - [phone]  After 6pm go to [email or web address] - password [email or web address] [facility]  Office  [phone]  CC: Primary care physician; Imelda Pillow, NP

## 2015-04-24 NOTE — Progress Notes (Signed)
Report called to [facility] at [facility]. EMS called for transport. Awaiting transportation. Bo Mcclintock, RN

## 2015-04-24 NOTE — Plan of Care (Signed)
Problem: Discharge Progression Outcomes Goal: Discharge plan in place and appropriate Outcome: Progressing Individualization of care Pt from the [facility]. Likes to be called Destiny Cole. Multiple medical conditions, controlled by home medications. Goal: Other Discharge Outcomes/Goals Outcome: Progressing Plan of care progress to goal: Hemodynamics: Vital signs stable. Complications resolved: Pt had PICC line placed yesterday for long term antibiotic therapy. Barriers to progression: Pt with some nausea (mostly dry heaves) and diarrhea. Stool sent for Cdiff which was negative. Imodium given 4 mg once and also ordered prn. Furthermore, pt has order for PT but has been refusing to get OOB during the night. Utilizes the bedpan for toileting with assist x 2.  Zofran given IVP once with improvement.

## 2015-04-25 DIAGNOSIS — E119 Type 2 diabetes mellitus without complications: Secondary | ICD-10-CM | POA: Diagnosis not present

## 2015-04-25 DIAGNOSIS — E785 Hyperlipidemia, unspecified: Secondary | ICD-10-CM | POA: Diagnosis not present

## 2015-04-25 LAB — GLUCOSE, CAPILLARY
GLUCOSE-CAPILLARY: 120 mg/dL — AB (ref 65–99)
Glucose-Capillary: 183 mg/dL — ABNORMAL HIGH (ref 65–99)

## 2015-04-25 LAB — BODY FLUID CULTURE

## 2015-04-26 DIAGNOSIS — E119 Type 2 diabetes mellitus without complications: Secondary | ICD-10-CM | POA: Diagnosis not present

## 2015-04-26 LAB — GLUCOSE, CAPILLARY
GLUCOSE-CAPILLARY: 207 mg/dL — AB (ref 65–99)
Glucose-Capillary: 177 mg/dL — ABNORMAL HIGH (ref 65–99)

## 2015-04-27 DIAGNOSIS — E119 Type 2 diabetes mellitus without complications: Secondary | ICD-10-CM | POA: Diagnosis not present

## 2015-04-27 LAB — GLUCOSE, CAPILLARY
Glucose-Capillary: 139 mg/dL — ABNORMAL HIGH (ref 65–99)
Glucose-Capillary: 255 mg/dL — ABNORMAL HIGH (ref 65–99)

## 2015-04-28 DIAGNOSIS — E119 Type 2 diabetes mellitus without complications: Secondary | ICD-10-CM | POA: Diagnosis not present

## 2015-04-28 LAB — GLUCOSE, CAPILLARY
GLUCOSE-CAPILLARY: 129 mg/dL — AB (ref 65–99)
Glucose-Capillary: 210 mg/dL — ABNORMAL HIGH (ref 65–99)
Glucose-Capillary: 173 mg/dL — ABNORMAL HIGH (ref 65–99)

## 2015-04-29 DIAGNOSIS — E119 Type 2 diabetes mellitus without complications: Secondary | ICD-10-CM | POA: Diagnosis not present

## 2015-04-29 LAB — GLUCOSE, CAPILLARY
GLUCOSE-CAPILLARY: 113 mg/dL — AB (ref 65–99)
Glucose-Capillary: 182 mg/dL — ABNORMAL HIGH (ref 65–99)

## 2015-04-30 DIAGNOSIS — E119 Type 2 diabetes mellitus without complications: Secondary | ICD-10-CM | POA: Diagnosis not present

## 2015-04-30 LAB — GLUCOSE, CAPILLARY
GLUCOSE-CAPILLARY: 160 mg/dL — AB (ref 65–99)
GLUCOSE-CAPILLARY: 128 mg/dL — AB (ref 65–99)

## 2015-05-01 ENCOUNTER — Other Ambulatory Visit: Payer: Self-pay | Admitting: Internal Medicine

## 2015-05-01 DIAGNOSIS — N739 Female pelvic inflammatory disease, unspecified: Secondary | ICD-10-CM

## 2015-05-01 DIAGNOSIS — A419 Sepsis, unspecified organism: Secondary | ICD-10-CM

## 2015-05-01 DIAGNOSIS — E119 Type 2 diabetes mellitus without complications: Secondary | ICD-10-CM | POA: Diagnosis not present

## 2015-05-03 ENCOUNTER — Emergency Department: Payer: Medicaid Other

## 2015-05-03 ENCOUNTER — Emergency Department
Admission: EM | Admit: 2015-05-03 | Discharge: 2015-05-03 | Disposition: A | Discharge status: Home / Self Care | Payer: Medicaid Other | Attending: Emergency Medicine | Admitting: Emergency Medicine

## 2015-05-03 ENCOUNTER — Encounter: Payer: Self-pay | Admitting: Emergency Medicine

## 2015-05-03 DIAGNOSIS — E119 Type 2 diabetes mellitus without complications: Secondary | ICD-10-CM | POA: Insufficient documentation

## 2015-05-03 DIAGNOSIS — K651 Peritoneal abscess: Secondary | ICD-10-CM | POA: Diagnosis not present

## 2015-05-03 DIAGNOSIS — Z794 Long term (current) use of insulin: Secondary | ICD-10-CM | POA: Diagnosis not present

## 2015-05-03 DIAGNOSIS — Y838 Other surgical procedures as the cause of abnormal reaction of the patient, or of later complication, without mention of misadventure at the time of the procedure: Secondary | ICD-10-CM | POA: Diagnosis not present

## 2015-05-03 DIAGNOSIS — IMO0001 Reserved for inherently not codable concepts without codable children: Secondary | ICD-10-CM

## 2015-05-03 DIAGNOSIS — I10 Essential (primary) hypertension: Secondary | ICD-10-CM | POA: Diagnosis not present

## 2015-05-03 DIAGNOSIS — Z7982 Long term (current) use of aspirin: Secondary | ICD-10-CM | POA: Insufficient documentation

## 2015-05-03 DIAGNOSIS — T814XXA Infection following a procedure, initial encounter: Secondary | ICD-10-CM | POA: Diagnosis not present

## 2015-05-03 DIAGNOSIS — Z79899 Other long term (current) drug therapy: Secondary | ICD-10-CM | POA: Insufficient documentation

## 2015-05-03 LAB — COMPREHENSIVE METABOLIC PANEL
ALK PHOS: 144 U/L — AB (ref 38–126)
ALT: 14 U/L (ref 14–54)
AST: 20 U/L (ref 15–41)
Albumin: 2 g/dL — ABNORMAL LOW (ref 3.5–5.0)
Anion gap: 13 (ref 5–15)
BILIRUBIN TOTAL: 0.6 mg/dL (ref 0.3–1.2)
BUN: 17 mg/dL (ref 6–20)
CHLORIDE: 91 mmol/L — AB (ref 101–111)
CO2: 28 mmol/L (ref 22–32)
CREATININE: 0.47 mg/dL (ref 0.44–1.00)
Calcium: 8.5 mg/dL — ABNORMAL LOW (ref 8.9–10.3)
GFR calc Af Amer: >60 mL/min (ref >60)
Glucose, Bld: 185 mg/dL — ABNORMAL HIGH (ref 65–99)
Potassium: 4.1 mmol/L (ref 3.5–5.1)
Sodium: 132 mmol/L — ABNORMAL LOW (ref 135–145)
Total Protein: 7.2 g/dL (ref 6.5–8.1)

## 2015-05-03 LAB — CBC WITH DIFFERENTIAL/PLATELET
Basophils Absolute: 0 10*3/uL (ref 0–0.1)
Basophils Relative: 0 %
EOS PCT: 1 %
Eosinophils Absolute: 0.2 10*3/uL (ref 0–0.7)
HEMATOCRIT: 31.5 % — AB (ref 35.0–47.0)
HEMOGLOBIN: 10.1 g/dL — AB (ref 12.0–16.0)
LYMPHS PCT: 7 %
Lymphs Abs: 1.6 10*3/uL (ref 1.0–3.6)
MCH: 28.6 pg (ref 26.0–34.0)
MCHC: 32.2 g/dL (ref 32.0–36.0)
MCV: 88.9 fL (ref 80.0–100.0)
MONOS PCT: 6 %
Monocytes Absolute: 1.4 10*3/uL — ABNORMAL HIGH (ref 0.2–0.9)
NEUTROS PCT: 86 %
Neutro Abs: 20 10*3/uL — ABNORMAL HIGH (ref 1.4–6.5)
PLATELETS: 972 10*3/uL — AB (ref 150–440)
RBC: 3.54 MIL/uL — AB (ref 3.80–5.20)
RDW: 15.5 % — ABNORMAL HIGH (ref 11.5–14.5)
WBC: 23.2 10*3/uL — AB (ref 3.6–11.0)

## 2015-05-03 LAB — URINALYSIS COMPLETE WITH MICROSCOPIC (ARMC ONLY)
BILIRUBIN URINE: NEGATIVE
Bacteria, UA: NONE SEEN
Glucose, UA: NEGATIVE mg/dL
LEUKOCYTES UA: NEGATIVE
Nitrite: NEGATIVE
PH: 7 (ref 5.0–8.0)
Protein, ur: NEGATIVE mg/dL
SPECIFIC GRAVITY, URINE: 1.057 — AB (ref 1.005–1.030)

## 2015-05-03 LAB — APTT: APTT: 38 s — AB (ref 24–36)

## 2015-05-03 LAB — PROTIME-INR
INR: 1.14
Prothrombin Time: 14.8 seconds (ref 11.4–15.0)

## 2015-05-03 LAB — GLUCOSE, CAPILLARY
Glucose-Capillary: 163 mg/dL — ABNORMAL HIGH (ref 65–99)
Glucose-Capillary: 216 mg/dL — ABNORMAL HIGH (ref 65–99)
Glucose-Capillary: 185 mg/dL — ABNORMAL HIGH (ref 65–99)
Glucose-Capillary: 208 mg/dL — ABNORMAL HIGH (ref 65–99)
Glucose-Capillary: 81 mg/dL (ref 65–99)
Glucose-Capillary: 133 mg/dL — ABNORMAL HIGH (ref 65–99)
Glucose-Capillary: 212 mg/dL — ABNORMAL HIGH (ref 65–99)

## 2015-05-03 MED ORDER — LORAZEPAM 2 MG/ML IJ SOLN
0.5000 mg | Freq: Once | INTRAMUSCULAR | Status: AC
  Administered 2015-05-03: 0.5 mg via INTRAVENOUS

## 2015-05-03 MED ORDER — IOHEXOL 300 MG/ML  SOLN
100.0000 mL | Freq: Once | INTRAMUSCULAR | Status: AC | PRN
  Administered 2015-05-03: 100 mL via INTRAVENOUS

## 2015-05-03 MED ORDER — LORAZEPAM 2 MG/ML IJ SOLN
INTRAMUSCULAR | Status: AC
  Filled 2015-05-03: qty 1

## 2015-05-03 NOTE — Discharge Instructions (Signed)
[name] your infection has significantly decreased in size. He did have an elevated white blood cell count today but your heart rate and blood pressure are normal need to have a fever. You need to continue your IV antibiotics and I would like you to see your physician in the next day. He developed dizziness fever nausea vomiting or have any concerns youcan always return to the emergency department    Percutaneous Abscess Drain An abscess is a collection of infected fluid inside the body. Your health care provider may decide to remove or drain the infected fluid from the area by placing a thin needle into the abscess. Usually, a small tube is left in place to drain the abscess fluid. The abscess fluid may take a few days to drain. LET [facility] CARE PROVIDER KNOW ABOUT:  Any allergies you have.  All medicines you are taking, including vitamins, herbs, eye drops, creams, and over-the-counter medicines. This includes steroid medicines by mouth or cream.  Previous problems you or members of your family have had with the use of anesthetics.  Any blood disorders you have.  Previous surgeries you have had.  Possibility of pregnancy, if this applies.  Medical conditions you have.  Any history of smoking. RISKS AND COMPLICATIONS Generally, this is a safe procedure. However, problems can occur and include:   Infection.  Allergic reaction to materials used (such as contrast dye).  Damage to a nearby organ or tissue.  Bleeding.  Blockage of a tube placed to drain the abscess, requiring placement of a new drainage tube.  A need to repeat the procedure.  Failure of the procedure to adequately drain the abscess, requiring an open surgical procedure to do so. BEFORE THE PROCEDURE   Ask your health care provider about:  Changing or stopping your regular medicines. This is especially important if you are taking diabetes medicines or blood thinners.  Taking medicines such as aspirin  and ibuprofen. These medicines can thin your blood. Do not take these medicines before your procedure if your health care provider asks you not to.  Your health care provider may do some blood or urine tests. These will help your health care provider learn how well your kidneys and liver are working and how well your blood clots.  Do not eat or drink anything after midnight on the night before the procedure or as directed by your health care provider.  Make arrangements for someone to drive you home after the procedure.  PROCEDURE   An IV tube will be placed in your arm. Medicine will be able to flow directly into your body through this tube.  You will lie on an X-ray table.  Your heart rate, blood pressure, and breathing will be monitored.   Your oxygen level will also be watched during the procedure. Supplemental oxygen may be given if necessary.  The skin around the area where the drainage tube (catheter) will be placed will be cleaned and numbed.  A small cut (incision) will then be made to insert the drainage tube. The drainage tube will be inserted using X-ray or CT scan to help direct where it should be placed.  The drainage tube will be guided into the abscess to drain the infected fluid.  The drainage tube may stay in place and be connected to a bag outside your body. It will stay until the fluid has stopped draining and the infection is gone. AFTER THE PROCEDURE  You will be taken to a recovery area  where you will stay until the medicines have worn off.  You will stay in bed for several hours.  Your progress will be monitored.   Your blood pressure and pulse will be checked often.   The area of the incision will be checked often.  You may have some pain or feel sick. Tell your health care provider.  As you begin to feel better, you may be given ice, fluids, and food.   When you can walk, drink, eat, and use the bathroom, you may be able to go home. Document  Released: 12/09/2013 Document Reviewed: 09/13/2013 [facility] Patient Information 2015 [place], [place]. This information is not intended to replace advice given to you by your health care provider. Make sure you discuss any questions you have with your health care provider.

## 2015-05-03 NOTE — ED Notes (Signed)
Pt to ED via EMS from [facility], pt is currently being treated for sepsis has j-p drain in place to left side of abd. And PICC line to left upper arm, states the j-p drain has come out some and warm to touch around insertion site, pt a&o x 4, c/o pain to entire left side of abd.

## 2015-05-03 NOTE — ED Notes (Signed)
Called [facility] and spoke with Eden Emms and advised of pt's return, Destiny Cole advised that she was pt's daughter, verbalized understanding of discharge instructions

## 2015-05-03 NOTE — ED Provider Notes (Signed)
[facility] Emergency Department Provider Note  ____________________________________________  Time seen: On arrival  I have reviewed the triage vital signs and the nursing notes.   HISTORY  Chief Complaint Flank pain   HPI Destiny Cole is a 64 y.o. female who presents with complaints of worsening left flank pain. She describes the pain as moderate to severe and is sharp in nature. Patient was admitted to the hospital approximately 2 weeks ago at which time she was discovered to have a pelvic abscess. She had an IR procedure with a drain placed. She was discharged to [facility] with a PICC line and is receiving IV antibiotics. She reports that she has developed worsening left flank pain over the last week. She denies fevers chills. She is concerned that her infection is worsening     Past Medical History  Diagnosis Date  . Diabetes mellitus without complication   . Cerebral palsy   . Hypertension   . CHF (congestive heart failure)     Patient Active Problem List   Diagnosis Date Noted  . Pressure sore 04/21/2015  . Sepsis 04/19/2015  . Pressure ulcer 04/06/2015  . Facial droop 04/06/2015  . Leukocytosis 04/06/2015  . Anxiety state 04/06/2015  . Failure to thrive in adult 04/05/2015  . DKA, type 2 04/05/2015  . Hyperlipidemia 04/05/2015  . Diabetic ketoacidosis without coma associated with diabetes mellitus due to underlying condition   . Physical deconditioning   . Acute respiratory depression with hypoxia - Resolved 10/30/2012  . Nausea, vomiting and fever - likely secondary to viral gastroenteritis 10/28/2012  . Diabetes mellitus without complication   . Cerebral palsy   . Hypertension     Past Surgical History  Procedure Laterality Date  . Abdominal hysterectomy    . Mouth surgery    . Cerbral palsy      Current Outpatient Rx  Name  Route  Sig  Dispense  Refill  . aspirin 81 MG chewable tablet   Oral   Chew 81 mg by mouth daily.        Marland Kitchen atorvastatin (LIPITOR) 40 MG tablet   Oral   Take 40 mg by mouth daily.         . busPIRone (BUSPAR) 7.5 MG tablet   Oral   Take 1 tablet (7.5 mg total) by mouth 2 (two) times daily.   30 tablet   0   . cefTRIAXone 1 g in dextrose 5 % 50 mL   Intravenous   Inject 1 g into the vein daily.   12 Units   0     1 gm IV daily - 12 days. Starting 04/25/2015   . Cholecalciferol (VITAMIN D3 PO)   Oral   Take 1 tablet by mouth daily. 100 units/tablet         . FLUoxetine (PROZAC) 20 MG capsule   Oral   Take 1 capsule (20 mg total) by mouth daily.   30 capsule   0   . Inositol Niacinate (NO FLUSH NIACIN) 500 MG TABS   Oral   Take 1 tablet by mouth daily.         . insulin aspart (NOVOLOG) cartridge      Correction coverage: Sensitive (thin, NPO, renal)     CBG < 70: implement hypoglycemia protocol    CBG 70 - 120: 0 units    CBG 121 - 150: 1 unit    CBG 151 - 200: 2 units    CBG 201 -  250: 3 units    CBG 251 - 300: 5 units    CBG 301 - 350: 7 units    CBG 351 - 400 9 units    CBG > 400 call MD and obtain STAT lab verification   15 mL   1   . insulin detemir (LEVEMIR) 100 UNIT/ML injection   Subcutaneous   Inject 36 Units into the skin at bedtime.         Marland Kitchen loperamide (IMODIUM) 2 MG capsule   Oral   Take 1 capsule (2 mg total) by mouth as needed for diarrhea or loose stools.   30 capsule   0   . Neo-Bacit-Poly-Lidocaine (FIRST AID PLUS LIDOCAINE EX)   Apply externally   Apply 1 application topically as needed (apply to broken skin areas).         . Omega-3 Fatty Acids (FISH OIL) 1200 MG CAPS   Oral   Take 1 capsule by mouth daily.         . ondansetron (ZOFRAN) 4 MG tablet   Oral   Take 1 tablet (4 mg total) by mouth every 8 (eight) hours as needed for nausea. Patient not taking: Reported on 04/02/2015   20 tablet   0   . oxyCODONE-acetaminophen (PERCOCET/ROXICET) 5-325 MG per tablet   Oral   Take 1 tablet by mouth every  6 (six) hours as needed for moderate pain.   30 tablet   0   . pantoprazole (PROTONIX) 40 MG tablet   Oral   Take 1 tablet (40 mg total) by mouth daily. Patient not taking: Reported on 04/02/2015   30 tablet   0   . potassium chloride (K-DUR) 10 MEQ tablet   Oral   Take 1 tablet (10 mEq total) by mouth daily. Patient not taking: Reported on 04/02/2015   30 tablet   0     Allergies Promethazine and Sulfa antibiotics  Family History  Problem Relation Age of Onset  . Breast cancer Mother   . Hypertension Mother   . Diabetes Mellitus II Maternal Aunt     Social History Social History  Substance Use Topics  . Smoking status: Never Smoker   . Smokeless tobacco: None  . Alcohol Use: No    Review of Systems  Constitutional: Negative for fever. Eyes: Negative for visual changes. ENT: Negative for sore throat Cardiovascular: Negative for chest pain. Respiratory: Negative for shortness of breath. Gastrointestinal: Left flank pain Genitourinary: Negative for dysuria. Musculoskeletal: Negative for back pain. Skin: Negative for rash. Neurological: Negative for headaches Psychiatric: No anxiety    ____________________________________________   PHYSICAL EXAM:  VITAL SIGNS: ED Triage Vitals  Enc Vitals Group     BP 05/03/15 1455 127/81 mmHg     Pulse Rate 05/03/15 1455 94     Resp 05/03/15 1455 18     Temp 05/03/15 1455 97.8 F (36.6 C)     Temp Source 05/03/15 1455 Oral     SpO2 05/03/15 1455 91 %     Weight 05/03/15 1455 204 lb (92.534 kg)     Height 05/03/15 1455  (1.549 m)     Head Cir --      Peak Flow --      Pain Score 05/03/15 1456 8     Pain Loc --      Pain Edu? --      Excl. in GC? --      Constitutional: Alert and oriented. No distress Eyes: Conjunctivae  are normal.  ENT   Head: Normocephalic and atraumatic.   Mouth/Throat: Mucous membranes are moist. Cardiovascular: Normal rate, regular rhythm. Normal and symmetric distal  pulses are present in all extremities. No murmurs, rubs, or gallops. Respiratory: Normal respiratory effort without tachypnea nor retractions. Breath sounds are clear and equal bilaterally.  Gastrointestinal: Patient with tenderness to palpation along the left superior posterior flank. There is a JP drain with thick yellow fluid and it. There is no leakage around the insertion site although the patient reports there has been. No distention. There is no CVA tenderness. Genitourinary: deferred Musculoskeletal: Nontender with normal range of motion in all extremities. No lower extremity tenderness nor edema. Neurologic:  Normal speech and language. No gross focal neurologic deficits are appreciated. Skin:  Skin is warm, dry and intact. No rash noted. Psychiatric: Mood and affect are normal. Patient exhibits appropriate insight and judgment.  ____________________________________________    LABS (pertinent positives/negatives)  Labs Reviewed  CBC WITH DIFFERENTIAL/PLATELET - Abnormal; Notable for the following:    WBC 23.2 (*)    RBC 3.54 (*)    Hemoglobin 10.1 (*)    HCT 31.5 (*)    RDW 15.5 (*)    Platelets 972 (*)    Neutro Abs 20.0 (*)    Monocytes Absolute 1.4 (*)    All other components within normal limits  COMPREHENSIVE METABOLIC PANEL - Abnormal; Notable for the following:    Sodium 132 (*)    Chloride 91 (*)    Glucose, Bld 185 (*)    Calcium 8.5 (*)    Albumin 2.0 (*)    Alkaline Phosphatase 144 (*)    All other components within normal limits  APTT - Abnormal; Notable for the following:    aPTT 38 (*)    All other components within normal limits  CULTURE, BLOOD (ROUTINE X 2)  CULTURE, BLOOD (ROUTINE X 2)  PROTIME-INR  PATHOLOGIST SMEAR REVIEW  URINALYSIS COMPLETEWITH MICROSCOPIC ([facility] ONLY)    ____________________________________________   EKG  None  ____________________________________________    RADIOLOGY I have personally reviewed any xrays that were  ordered on this patient: CT scan shows improvement and pelvic abscess and JP drain is appropriately placed  ____________________________________________   PROCEDURES  Procedure(s) performed: none  Critical Care performed:None  ____________________________________________   INITIAL IMPRESSION / ASSESSMENT AND PLAN / ED COURSE  Pertinent labs & imaging results that were available during my care of the patient were reviewed by me and considered in my medical decision making (see chart for details).  Patient with worsening left flank pain. She has a history of left pelvic abscess. Certainly there is concern that this could be worsening as opposed to improving. We'll obtain blood work and imaging as needed. And reevaluate  ----------------------------------------- 6:26 PM on 05/03/2015 -----------------------------------------  CT scan shows improvement in fluid collection with JP drain in appropriate place. Patient's heart rate and blood pressure have been stable throughout her stay. The only abnormalities on her lab work is significantly elevated white blood cell count however her white blood cell count has been consistently elevated which is not present given her pelvic abscess. She has a PICC line and is receiving IV antibiotics at the skilled nursing facility. Do not see an indication to bring her into the hospital at this time. She is not had any fevers chills nausea or vomiting and her vital signs are stable. I discussed this with the patient and she agrees to this plan. She was just concerned that her  abscess was worsening but I does not seem to be the case.  ____________________________________________   FINAL CLINICAL IMPRESSION(S) / ED DIAGNOSES  Final diagnoses:  Intra-abdominal abscess post-procedure, initial encounter     Jene Every, MD 05/03/15 1911

## 2015-05-03 NOTE — ED Notes (Signed)
I&O cath preformed, pt tolerated well, urine sample sent to lab

## 2015-05-04 DIAGNOSIS — E119 Type 2 diabetes mellitus without complications: Secondary | ICD-10-CM | POA: Diagnosis not present

## 2015-05-04 LAB — GLUCOSE, CAPILLARY
GLUCOSE-CAPILLARY: 173 mg/dL — AB (ref 65–99)
GLUCOSE-CAPILLARY: 194 mg/dL — AB (ref 65–99)
GLUCOSE-CAPILLARY: 227 mg/dL — AB (ref 65–99)
Glucose-Capillary: 140 mg/dL — ABNORMAL HIGH (ref 65–99)

## 2015-05-04 LAB — PATHOLOGIST SMEAR REVIEW

## 2015-05-05 ENCOUNTER — Telehealth: Payer: Self-pay | Admitting: Surgery

## 2015-05-05 DIAGNOSIS — E119 Type 2 diabetes mellitus without complications: Secondary | ICD-10-CM | POA: Diagnosis not present

## 2015-05-05 LAB — GLUCOSE, CAPILLARY
GLUCOSE-CAPILLARY: 145 mg/dL — AB (ref 65–99)
GLUCOSE-CAPILLARY: 146 mg/dL — AB (ref 65–99)
GLUCOSE-CAPILLARY: 154 mg/dL — AB (ref 65–99)

## 2015-05-05 LAB — COMPREHENSIVE METABOLIC PANEL
ALT: 11 U/L — AB (ref 14–54)
ANION GAP: 9 (ref 5–15)
AST: 17 U/L (ref 15–41)
Albumin: 1.9 g/dL — ABNORMAL LOW (ref 3.5–5.0)
Alkaline Phosphatase: 98 U/L (ref 38–126)
BUN: 17 mg/dL (ref 6–20)
CALCIUM: 8.5 mg/dL — AB (ref 8.9–10.3)
CHLORIDE: 95 mmol/L — AB (ref 101–111)
CO2: 30 mmol/L (ref 22–32)
CREATININE: 0.31 mg/dL — AB (ref 0.44–1.00)
Glucose, Bld: 162 mg/dL — ABNORMAL HIGH (ref 65–99)
Potassium: 3.8 mmol/L (ref 3.5–5.1)
Sodium: 134 mmol/L — ABNORMAL LOW (ref 135–145)
Total Bilirubin: 0.5 mg/dL (ref 0.3–1.2)
Total Protein: 6.5 g/dL (ref 6.5–8.1)

## 2015-05-05 LAB — CBC WITH DIFFERENTIAL/PLATELET
Basophils Absolute: 0.1 10*3/uL (ref 0–0.1)
Basophils Relative: 0 %
EOS PCT: 2 %
Eosinophils Absolute: 0.3 10*3/uL (ref 0–0.7)
HCT: 29.4 % — ABNORMAL LOW (ref 35.0–47.0)
Hemoglobin: 9.5 g/dL — ABNORMAL LOW (ref 12.0–16.0)
LYMPHS ABS: 2 10*3/uL (ref 1.0–3.6)
LYMPHS PCT: 15 %
MCH: 28.9 pg (ref 26.0–34.0)
MCHC: 32.4 g/dL (ref 32.0–36.0)
MCV: 89.3 fL (ref 80.0–100.0)
MONO ABS: 1.1 10*3/uL — AB (ref 0.2–0.9)
MONOS PCT: 8 %
Neutro Abs: 10.2 10*3/uL — ABNORMAL HIGH (ref 1.4–6.5)
Neutrophils Relative %: 75 %
PLATELETS: 879 10*3/uL — AB (ref 150–440)
RBC: 3.3 MIL/uL — AB (ref 3.80–5.20)
RDW: 15.6 % — AB (ref 11.5–14.5)
WBC: 13.7 10*3/uL — ABNORMAL HIGH (ref 3.6–11.0)

## 2015-05-05 NOTE — Telephone Encounter (Signed)
Pam called from [facility] place where patient stays and needed to know if patient still needs to have CT done, because patient had one done a few days ago because of her JP drian, please call her and let her know   #[phone]

## 2015-05-05 NOTE — Telephone Encounter (Signed)
Moved CT to 05/12/15 at 0930am in [facility] to re-evaluate and then will follow-up with Dr. Excell Seltzer in office on 05/13/15.  Called [facility] back, spoke with Pam and explained the situation above. She readback all instructions and date, time, and location of both appointments.

## 2015-05-06 DIAGNOSIS — E119 Type 2 diabetes mellitus without complications: Secondary | ICD-10-CM | POA: Diagnosis not present

## 2015-05-06 LAB — GLUCOSE, CAPILLARY
GLUCOSE-CAPILLARY: 172 mg/dL — AB (ref 65–99)
Glucose-Capillary: 168 mg/dL — ABNORMAL HIGH (ref 65–99)
Glucose-Capillary: 165 mg/dL — ABNORMAL HIGH (ref 65–99)

## 2015-05-07 ENCOUNTER — Ambulatory Visit: Payer: Medicaid Other

## 2015-05-07 DIAGNOSIS — E119 Type 2 diabetes mellitus without complications: Secondary | ICD-10-CM | POA: Diagnosis not present

## 2015-05-07 LAB — GLUCOSE, CAPILLARY
GLUCOSE-CAPILLARY: 99 mg/dL (ref 65–99)
Glucose-Capillary: 120 mg/dL — ABNORMAL HIGH (ref 65–99)

## 2015-05-08 ENCOUNTER — Other Ambulatory Visit: Payer: Self-pay | Admitting: Internal Medicine

## 2015-05-08 ENCOUNTER — Ambulatory Visit
Admission: RE | Admit: 2015-05-08 | Discharge: 2015-05-08 | Disposition: A | Discharge status: Home / Self Care | Payer: Medicaid Other | Source: Ambulatory Visit | Attending: Internal Medicine | Admitting: Internal Medicine

## 2015-05-08 DIAGNOSIS — N739 Female pelvic inflammatory disease, unspecified: Secondary | ICD-10-CM

## 2015-05-08 DIAGNOSIS — E119 Type 2 diabetes mellitus without complications: Secondary | ICD-10-CM | POA: Diagnosis not present

## 2015-05-08 DIAGNOSIS — N732 Unspecified parametritis and pelvic cellulitis: Secondary | ICD-10-CM | POA: Insufficient documentation

## 2015-05-08 LAB — GLUCOSE, CAPILLARY
GLUCOSE-CAPILLARY: 184 mg/dL — AB (ref 65–99)
GLUCOSE-CAPILLARY: 233 mg/dL — AB (ref 65–99)
GLUCOSE-CAPILLARY: 279 mg/dL — AB (ref 65–99)
GLUCOSE-CAPILLARY: 110 mg/dL — AB (ref 65–99)
Glucose-Capillary: 137 mg/dL — ABNORMAL HIGH (ref 65–99)

## 2015-05-08 MED ORDER — SODIUM CHLORIDE 0.9 % IV SOLN
INTRAVENOUS | Status: DC
  Administered 2015-05-08: 13:00:00 via INTRAVENOUS

## 2015-05-08 MED ORDER — FENTANYL CITRATE (PF) 100 MCG/2ML IJ SOLN
INTRAMUSCULAR | Status: AC
  Filled 2015-05-08: qty 2

## 2015-05-08 MED ORDER — HYDROCODONE-ACETAMINOPHEN 5-325 MG PO TABS
ORAL_TABLET | ORAL | Status: AC
  Filled 2015-05-08: qty 1

## 2015-05-08 MED ORDER — HYDROCODONE-ACETAMINOPHEN 5-325 MG PO TABS
1.0000 | ORAL_TABLET | ORAL | Status: DC | PRN
  Administered 2015-05-08: 1 via ORAL
  Filled 2015-05-08 (×2): qty 1

## 2015-05-08 MED ORDER — MIDAZOLAM HCL 5 MG/5ML IJ SOLN
INTRAMUSCULAR | Status: AC | PRN
  Administered 2015-05-08: 1 mg via INTRAVENOUS
  Administered 2015-05-08: 0.5 mg via INTRAVENOUS

## 2015-05-08 MED ORDER — MIDAZOLAM HCL 5 MG/5ML IJ SOLN
INTRAMUSCULAR | Status: AC
  Filled 2015-05-08: qty 5

## 2015-05-08 MED ORDER — FENTANYL CITRATE (PF) 100 MCG/2ML IJ SOLN
25.0000 ug | Freq: Once | INTRAMUSCULAR | Status: AC
  Administered 2015-05-08: 25 ug via INTRAVENOUS

## 2015-05-08 MED ORDER — FENTANYL CITRATE (PF) 100 MCG/2ML IJ SOLN
INTRAMUSCULAR | Status: AC | PRN
  Administered 2015-05-08: 50 ug via INTRAVENOUS

## 2015-05-08 MED ORDER — FENTANYL CITRATE (PF) 100 MCG/2ML IJ SOLN
INTRAMUSCULAR | Status: DC
Start: 2015-05-08 — End: 2015-05-09
  Filled 2015-05-08: qty 4

## 2015-05-08 MED ORDER — HEPARIN SOD (PORK) LOCK FLUSH 10 UNIT/ML IV SOLN
INTRAVENOUS | Status: DC
Start: 2015-05-08 — End: 2015-05-09
  Filled 2015-05-08: qty 1

## 2015-05-08 NOTE — Procedures (Signed)
Under CT guidance, 26F drainage catheter placed into right sided abscess. 380 mls of thick tan fluid aspirated. Hooked up to JP suction bulb drainage. No immediate complication.

## 2015-05-08 NOTE — Sedation Documentation (Signed)
#  14 Fr. Multipurpose catheter inserted without difficulty.  380 ml thick "chocolate" reddish brown drainage aspirated.

## 2015-05-08 NOTE — Procedures (Signed)
Procedure and risks discussed with patient. Informed consent obtained. Will perform CT-guided drainage of left retroperitoneal abscess.

## 2015-05-09 ENCOUNTER — Encounter
Admission: RE | Admit: 2015-05-09 | Discharge: 2015-05-09 | Disposition: A | Discharge status: Home / Self Care | Payer: Medicaid Other | Source: Ambulatory Visit | Attending: Internal Medicine | Admitting: Internal Medicine

## 2015-05-09 DIAGNOSIS — L02211 Cutaneous abscess of abdominal wall: Secondary | ICD-10-CM | POA: Insufficient documentation

## 2015-05-09 DIAGNOSIS — Z794 Long term (current) use of insulin: Secondary | ICD-10-CM | POA: Insufficient documentation

## 2015-05-09 DIAGNOSIS — E119 Type 2 diabetes mellitus without complications: Secondary | ICD-10-CM | POA: Insufficient documentation

## 2015-05-09 LAB — CULTURE, BLOOD (ROUTINE X 2)
Culture: NO GROWTH
Culture: NO GROWTH

## 2015-05-09 LAB — GLUCOSE, CAPILLARY
GLUCOSE-CAPILLARY: 302 mg/dL — AB (ref 65–99)
Glucose-Capillary: 178 mg/dL — ABNORMAL HIGH (ref 65–99)

## 2015-05-10 DIAGNOSIS — L02211 Cutaneous abscess of abdominal wall: Secondary | ICD-10-CM | POA: Diagnosis not present

## 2015-05-10 LAB — GLUCOSE, CAPILLARY
GLUCOSE-CAPILLARY: 187 mg/dL — AB (ref 65–99)
Glucose-Capillary: 169 mg/dL — ABNORMAL HIGH (ref 65–99)

## 2015-05-11 DIAGNOSIS — L02211 Cutaneous abscess of abdominal wall: Secondary | ICD-10-CM | POA: Diagnosis not present

## 2015-05-12 ENCOUNTER — Ambulatory Visit: Payer: Medicaid Other

## 2015-05-12 LAB — GLUCOSE, CAPILLARY
GLUCOSE-CAPILLARY: 237 mg/dL — AB (ref 65–99)
GLUCOSE-CAPILLARY: 292 mg/dL — AB (ref 65–99)
GLUCOSE-CAPILLARY: 319 mg/dL — AB (ref 65–99)
Glucose-Capillary: 247 mg/dL — ABNORMAL HIGH (ref 65–99)
Glucose-Capillary: 316 mg/dL — ABNORMAL HIGH (ref 65–99)
Glucose-Capillary: 176 mg/dL — ABNORMAL HIGH (ref 65–99)
Glucose-Capillary: 247 mg/dL — ABNORMAL HIGH (ref 65–99)

## 2015-05-13 ENCOUNTER — Ambulatory Visit: Payer: Medicaid Other | Admitting: Surgery

## 2015-05-13 DIAGNOSIS — L02211 Cutaneous abscess of abdominal wall: Secondary | ICD-10-CM | POA: Diagnosis not present

## 2015-05-13 LAB — GLUCOSE, CAPILLARY
GLUCOSE-CAPILLARY: 310 mg/dL — AB (ref 65–99)
Glucose-Capillary: 226 mg/dL — ABNORMAL HIGH (ref 65–99)
Glucose-Capillary: 252 mg/dL — ABNORMAL HIGH (ref 65–99)
Glucose-Capillary: 279 mg/dL — ABNORMAL HIGH (ref 65–99)

## 2015-05-14 DIAGNOSIS — L02211 Cutaneous abscess of abdominal wall: Secondary | ICD-10-CM | POA: Diagnosis not present

## 2015-05-14 LAB — CBC WITH DIFFERENTIAL/PLATELET
BASOS PCT: 1 %
Basophils Absolute: 0.1 10*3/uL (ref 0–0.1)
EOS ABS: 0.3 10*3/uL (ref 0–0.7)
EOS PCT: 4 %
HCT: 34.8 % — ABNORMAL LOW (ref 35.0–47.0)
Hemoglobin: 11.3 g/dL — ABNORMAL LOW (ref 12.0–16.0)
LYMPHS ABS: 2.1 10*3/uL (ref 1.0–3.6)
Lymphocytes Relative: 28 %
MCH: 29.1 pg (ref 26.0–34.0)
MCHC: 32.4 g/dL (ref 32.0–36.0)
MCV: 89.9 fL (ref 80.0–100.0)
MONO ABS: 0.6 10*3/uL (ref 0.2–0.9)
MONOS PCT: 7 %
Neutro Abs: 4.5 10*3/uL (ref 1.4–6.5)
Neutrophils Relative %: 60 %
Platelets: 617 10*3/uL — ABNORMAL HIGH (ref 150–440)
RBC: 3.87 MIL/uL (ref 3.80–5.20)
RDW: 16.1 % — AB (ref 11.5–14.5)
WBC: 7.5 10*3/uL (ref 3.6–11.0)

## 2015-05-14 LAB — COMPREHENSIVE METABOLIC PANEL
ALBUMIN: 2.9 g/dL — AB (ref 3.5–5.0)
ALK PHOS: 80 U/L (ref 38–126)
ALT: 15 U/L (ref 14–54)
AST: 18 U/L (ref 15–41)
Anion gap: 9 (ref 5–15)
BUN: 25 mg/dL — AB (ref 6–20)
CALCIUM: 9.1 mg/dL (ref 8.9–10.3)
CHLORIDE: 98 mmol/L — AB (ref 101–111)
CO2: 29 mmol/L (ref 22–32)
CREATININE: 0.35 mg/dL — AB (ref 0.44–1.00)
GFR calc non Af Amer: >60 mL/min (ref >60)
GLUCOSE: 228 mg/dL — AB (ref 65–99)
Potassium: 3.7 mmol/L (ref 3.5–5.1)
SODIUM: 136 mmol/L (ref 135–145)
Total Bilirubin: 0.2 mg/dL — ABNORMAL LOW (ref 0.3–1.2)
Total Protein: 7.1 g/dL (ref 6.5–8.1)

## 2015-05-14 LAB — GLUCOSE, CAPILLARY
GLUCOSE-CAPILLARY: 280 mg/dL — AB (ref 65–99)
GLUCOSE-CAPILLARY: 301 mg/dL — AB (ref 65–99)
Glucose-Capillary: 209 mg/dL — ABNORMAL HIGH (ref 65–99)

## 2015-05-15 DIAGNOSIS — L02211 Cutaneous abscess of abdominal wall: Secondary | ICD-10-CM | POA: Diagnosis not present

## 2015-05-15 LAB — GLUCOSE, CAPILLARY
GLUCOSE-CAPILLARY: 189 mg/dL — AB (ref 65–99)
GLUCOSE-CAPILLARY: 242 mg/dL — AB (ref 65–99)
Glucose-Capillary: 320 mg/dL — ABNORMAL HIGH (ref 65–99)
Glucose-Capillary: 297 mg/dL — ABNORMAL HIGH (ref 65–99)

## 2015-05-16 DIAGNOSIS — L02211 Cutaneous abscess of abdominal wall: Secondary | ICD-10-CM | POA: Diagnosis not present

## 2015-05-16 LAB — GLUCOSE, CAPILLARY
GLUCOSE-CAPILLARY: 346 mg/dL — AB (ref 65–99)
Glucose-Capillary: 176 mg/dL — ABNORMAL HIGH (ref 65–99)
Glucose-Capillary: 214 mg/dL — ABNORMAL HIGH (ref 65–99)
Glucose-Capillary: 230 mg/dL — ABNORMAL HIGH (ref 65–99)

## 2015-05-17 DIAGNOSIS — L02211 Cutaneous abscess of abdominal wall: Secondary | ICD-10-CM | POA: Diagnosis not present

## 2015-05-17 LAB — GLUCOSE, CAPILLARY
Glucose-Capillary: 209 mg/dL — ABNORMAL HIGH (ref 65–99)
Glucose-Capillary: 391 mg/dL — ABNORMAL HIGH (ref 65–99)
Glucose-Capillary: 339 mg/dL — ABNORMAL HIGH (ref 65–99)
Glucose-Capillary: 284 mg/dL — ABNORMAL HIGH (ref 65–99)

## 2015-05-18 DIAGNOSIS — L02211 Cutaneous abscess of abdominal wall: Secondary | ICD-10-CM | POA: Diagnosis not present

## 2015-05-18 LAB — GLUCOSE, CAPILLARY
GLUCOSE-CAPILLARY: 224 mg/dL — AB (ref 65–99)
GLUCOSE-CAPILLARY: 226 mg/dL — AB (ref 65–99)
Glucose-Capillary: 321 mg/dL — ABNORMAL HIGH (ref 65–99)
Glucose-Capillary: 299 mg/dL — ABNORMAL HIGH (ref 65–99)

## 2015-05-19 DIAGNOSIS — L02211 Cutaneous abscess of abdominal wall: Secondary | ICD-10-CM | POA: Diagnosis not present

## 2015-05-19 LAB — GLUCOSE, CAPILLARY
GLUCOSE-CAPILLARY: 270 mg/dL — AB (ref 65–99)
GLUCOSE-CAPILLARY: 279 mg/dL — AB (ref 65–99)

## 2015-05-20 DIAGNOSIS — L02211 Cutaneous abscess of abdominal wall: Secondary | ICD-10-CM | POA: Diagnosis not present

## 2015-05-20 LAB — GLUCOSE, CAPILLARY
GLUCOSE-CAPILLARY: 235 mg/dL — AB (ref 65–99)
GLUCOSE-CAPILLARY: 430 mg/dL — AB (ref 65–99)
GLUCOSE-CAPILLARY: 354 mg/dL — AB (ref 65–99)
Glucose-Capillary: 353 mg/dL — ABNORMAL HIGH (ref 65–99)

## 2015-05-21 DIAGNOSIS — L02211 Cutaneous abscess of abdominal wall: Secondary | ICD-10-CM | POA: Diagnosis not present

## 2015-05-21 LAB — GLUCOSE, CAPILLARY: Glucose-Capillary: 199 mg/dL — ABNORMAL HIGH (ref 65–99)

## 2015-05-22 DIAGNOSIS — L02211 Cutaneous abscess of abdominal wall: Secondary | ICD-10-CM | POA: Diagnosis not present

## 2015-05-22 LAB — GLUCOSE, CAPILLARY
GLUCOSE-CAPILLARY: 307 mg/dL — AB (ref 65–99)
GLUCOSE-CAPILLARY: 309 mg/dL — AB (ref 65–99)
GLUCOSE-CAPILLARY: 244 mg/dL — AB (ref 65–99)
GLUCOSE-CAPILLARY: 299 mg/dL — AB (ref 65–99)
Glucose-Capillary: 231 mg/dL — ABNORMAL HIGH (ref 65–99)
Glucose-Capillary: 296 mg/dL — ABNORMAL HIGH (ref 65–99)

## 2015-05-23 DIAGNOSIS — L02211 Cutaneous abscess of abdominal wall: Secondary | ICD-10-CM | POA: Diagnosis not present

## 2015-05-23 LAB — GLUCOSE, CAPILLARY
Glucose-Capillary: 213 mg/dL — ABNORMAL HIGH (ref 65–99)
Glucose-Capillary: 252 mg/dL — ABNORMAL HIGH (ref 65–99)

## 2015-05-24 DIAGNOSIS — L02211 Cutaneous abscess of abdominal wall: Secondary | ICD-10-CM | POA: Diagnosis not present

## 2015-05-24 LAB — GLUCOSE, CAPILLARY
GLUCOSE-CAPILLARY: 203 mg/dL — AB (ref 65–99)
Glucose-Capillary: 201 mg/dL — ABNORMAL HIGH (ref 65–99)

## 2015-05-25 DIAGNOSIS — L02211 Cutaneous abscess of abdominal wall: Secondary | ICD-10-CM | POA: Diagnosis not present

## 2015-05-25 LAB — GLUCOSE, CAPILLARY
GLUCOSE-CAPILLARY: 229 mg/dL — AB (ref 65–99)
GLUCOSE-CAPILLARY: 266 mg/dL — AB (ref 65–99)
Glucose-Capillary: 272 mg/dL — ABNORMAL HIGH (ref 65–99)

## 2015-05-26 DIAGNOSIS — L02211 Cutaneous abscess of abdominal wall: Secondary | ICD-10-CM | POA: Diagnosis not present

## 2015-05-26 LAB — GLUCOSE, CAPILLARY
GLUCOSE-CAPILLARY: 305 mg/dL — AB (ref 65–99)
Glucose-Capillary: 249 mg/dL — ABNORMAL HIGH (ref 65–99)

## 2015-05-27 DIAGNOSIS — L02211 Cutaneous abscess of abdominal wall: Secondary | ICD-10-CM | POA: Diagnosis not present

## 2015-05-27 LAB — GLUCOSE, CAPILLARY
GLUCOSE-CAPILLARY: 317 mg/dL — AB (ref 65–99)
GLUCOSE-CAPILLARY: 281 mg/dL — AB (ref 65–99)
GLUCOSE-CAPILLARY: 225 mg/dL — AB (ref 65–99)
GLUCOSE-CAPILLARY: 233 mg/dL — AB (ref 65–99)
Glucose-Capillary: 291 mg/dL — ABNORMAL HIGH (ref 65–99)

## 2015-05-28 DIAGNOSIS — L02211 Cutaneous abscess of abdominal wall: Secondary | ICD-10-CM | POA: Diagnosis not present

## 2015-05-28 LAB — CBC WITH DIFFERENTIAL/PLATELET
BASOS ABS: 0.2 10*3/uL — AB (ref 0–0.1)
Basophils Relative: 3 %
EOS PCT: 6 %
Eosinophils Absolute: 0.4 10*3/uL (ref 0–0.7)
HCT: 35.6 % (ref 35.0–47.0)
Hemoglobin: 11.5 g/dL — ABNORMAL LOW (ref 12.0–16.0)
LYMPHS ABS: 1.4 10*3/uL (ref 1.0–3.6)
LYMPHS PCT: 25 %
MCH: 29.2 pg (ref 26.0–34.0)
MCHC: 32.2 g/dL (ref 32.0–36.0)
MCV: 90.7 fL (ref 80.0–100.0)
MONOS PCT: 6 %
Monocytes Absolute: 0.4 10*3/uL (ref 0.2–0.9)
Neutro Abs: 3.4 10*3/uL (ref 1.4–6.5)
Neutrophils Relative %: 60 %
PLATELETS: 319 10*3/uL (ref 150–440)
RBC: 3.92 MIL/uL (ref 3.80–5.20)
RDW: 17.1 % — ABNORMAL HIGH (ref 11.5–14.5)
WBC: 5.8 10*3/uL (ref 3.6–11.0)

## 2015-05-28 LAB — COMPREHENSIVE METABOLIC PANEL
ALT: 180 U/L — AB (ref 14–54)
AST: 75 U/L — AB (ref 15–41)
Albumin: 3.2 g/dL — ABNORMAL LOW (ref 3.5–5.0)
Alkaline Phosphatase: 86 U/L (ref 38–126)
Anion gap: 6 (ref 5–15)
BUN: 29 mg/dL — AB (ref 6–20)
CHLORIDE: 102 mmol/L (ref 101–111)
CO2: 28 mmol/L (ref 22–32)
CREATININE: 0.38 mg/dL — AB (ref 0.44–1.00)
Calcium: 9.1 mg/dL (ref 8.9–10.3)
GFR calc Af Amer: >60 mL/min (ref >60)
GFR calc non Af Amer: >60 mL/min (ref >60)
GLUCOSE: 229 mg/dL — AB (ref 65–99)
Potassium: 3.6 mmol/L (ref 3.5–5.1)
SODIUM: 136 mmol/L (ref 135–145)
Total Bilirubin: 0.5 mg/dL (ref 0.3–1.2)
Total Protein: 6.8 g/dL (ref 6.5–8.1)

## 2015-05-28 LAB — GLUCOSE, CAPILLARY
Glucose-Capillary: 197 mg/dL — ABNORMAL HIGH (ref 65–99)
Glucose-Capillary: 210 mg/dL — ABNORMAL HIGH (ref 65–99)
Glucose-Capillary: 229 mg/dL — ABNORMAL HIGH (ref 65–99)

## 2015-05-29 DIAGNOSIS — L02211 Cutaneous abscess of abdominal wall: Secondary | ICD-10-CM | POA: Diagnosis not present

## 2015-05-30 LAB — GLUCOSE, CAPILLARY
GLUCOSE-CAPILLARY: 250 mg/dL — AB (ref 65–99)
GLUCOSE-CAPILLARY: 242 mg/dL — AB (ref 65–99)
GLUCOSE-CAPILLARY: 218 mg/dL — AB (ref 65–99)
Glucose-Capillary: 220 mg/dL — ABNORMAL HIGH (ref 65–99)
Glucose-Capillary: 215 mg/dL — ABNORMAL HIGH (ref 65–99)
Glucose-Capillary: 283 mg/dL — ABNORMAL HIGH (ref 65–99)

## 2015-05-31 DIAGNOSIS — L02211 Cutaneous abscess of abdominal wall: Secondary | ICD-10-CM | POA: Diagnosis not present

## 2015-05-31 LAB — GLUCOSE, CAPILLARY
Glucose-Capillary: 303 mg/dL — ABNORMAL HIGH (ref 65–99)
Glucose-Capillary: 223 mg/dL — ABNORMAL HIGH (ref 65–99)
Glucose-Capillary: 262 mg/dL — ABNORMAL HIGH (ref 65–99)

## 2015-06-01 DIAGNOSIS — L02211 Cutaneous abscess of abdominal wall: Secondary | ICD-10-CM | POA: Diagnosis not present

## 2015-06-01 LAB — GLUCOSE, CAPILLARY
GLUCOSE-CAPILLARY: 272 mg/dL — AB (ref 65–99)
GLUCOSE-CAPILLARY: 306 mg/dL — AB (ref 65–99)

## 2015-06-02 DIAGNOSIS — L02211 Cutaneous abscess of abdominal wall: Secondary | ICD-10-CM | POA: Diagnosis not present

## 2015-06-02 LAB — GLUCOSE, CAPILLARY
Glucose-Capillary: 294 mg/dL — ABNORMAL HIGH (ref 65–99)
Glucose-Capillary: 271 mg/dL — ABNORMAL HIGH (ref 65–99)

## 2015-06-03 DIAGNOSIS — L02211 Cutaneous abscess of abdominal wall: Secondary | ICD-10-CM | POA: Diagnosis not present

## 2015-06-03 LAB — GLUCOSE, CAPILLARY
GLUCOSE-CAPILLARY: 351 mg/dL — AB (ref 65–99)
Glucose-Capillary: 272 mg/dL — ABNORMAL HIGH (ref 65–99)

## 2015-06-04 DIAGNOSIS — L02211 Cutaneous abscess of abdominal wall: Secondary | ICD-10-CM | POA: Diagnosis not present

## 2015-06-04 LAB — CBC WITH DIFFERENTIAL/PLATELET
BASOS ABS: 0.1 10*3/uL (ref 0–0.1)
BASOS PCT: 1 %
EOS PCT: 5 %
Eosinophils Absolute: 0.3 10*3/uL (ref 0–0.7)
HCT: 35.9 % (ref 35.0–47.0)
Hemoglobin: 11.9 g/dL — ABNORMAL LOW (ref 12.0–16.0)
Lymphocytes Relative: 38 %
Lymphs Abs: 2.2 10*3/uL (ref 1.0–3.6)
MCH: 30.3 pg (ref 26.0–34.0)
MCHC: 33.3 g/dL (ref 32.0–36.0)
MCV: 91 fL (ref 80.0–100.0)
MONO ABS: 0.7 10*3/uL (ref 0.2–0.9)
Monocytes Relative: 11 %
NEUTROS ABS: 2.6 10*3/uL (ref 1.4–6.5)
Neutrophils Relative %: 45 %
PLATELETS: 312 10*3/uL (ref 150–440)
RBC: 3.95 MIL/uL (ref 3.80–5.20)
RDW: 16.9 % — AB (ref 11.5–14.5)
WBC: 5.8 10*3/uL (ref 3.6–11.0)

## 2015-06-04 LAB — COMPREHENSIVE METABOLIC PANEL
ALBUMIN: 3.3 g/dL — AB (ref 3.5–5.0)
ALK PHOS: 83 U/L (ref 38–126)
ALT: 30 U/L (ref 14–54)
ANION GAP: 8 (ref 5–15)
AST: 15 U/L (ref 15–41)
BUN: 26 mg/dL — ABNORMAL HIGH (ref 6–20)
CHLORIDE: 102 mmol/L (ref 101–111)
CO2: 29 mmol/L (ref 22–32)
Calcium: 9 mg/dL (ref 8.9–10.3)
Creatinine, Ser: 0.32 mg/dL — ABNORMAL LOW (ref 0.44–1.00)
GFR calc Af Amer: >60 mL/min (ref >60)
GFR calc non Af Amer: >60 mL/min (ref >60)
GLUCOSE: 224 mg/dL — AB (ref 65–99)
POTASSIUM: 3.4 mmol/L — AB (ref 3.5–5.1)
SODIUM: 139 mmol/L (ref 135–145)
Total Bilirubin: 0.4 mg/dL (ref 0.3–1.2)
Total Protein: 6.8 g/dL (ref 6.5–8.1)

## 2015-06-04 LAB — GLUCOSE, CAPILLARY
GLUCOSE-CAPILLARY: 337 mg/dL — AB (ref 65–99)
GLUCOSE-CAPILLARY: 212 mg/dL — AB (ref 65–99)
GLUCOSE-CAPILLARY: 309 mg/dL — AB (ref 65–99)
GLUCOSE-CAPILLARY: 224 mg/dL — AB (ref 65–99)
Glucose-Capillary: 279 mg/dL — ABNORMAL HIGH (ref 65–99)

## 2015-06-05 DIAGNOSIS — L02211 Cutaneous abscess of abdominal wall: Secondary | ICD-10-CM | POA: Diagnosis not present

## 2015-06-05 LAB — GLUCOSE, CAPILLARY
GLUCOSE-CAPILLARY: 308 mg/dL — AB (ref 65–99)
GLUCOSE-CAPILLARY: 298 mg/dL — AB (ref 65–99)
Glucose-Capillary: 252 mg/dL — ABNORMAL HIGH (ref 65–99)

## 2015-06-06 DIAGNOSIS — L02211 Cutaneous abscess of abdominal wall: Secondary | ICD-10-CM | POA: Diagnosis not present

## 2015-06-06 LAB — GLUCOSE, CAPILLARY
Glucose-Capillary: 221 mg/dL — ABNORMAL HIGH (ref 65–99)
Glucose-Capillary: 333 mg/dL — ABNORMAL HIGH (ref 65–99)

## 2015-06-07 DIAGNOSIS — L02211 Cutaneous abscess of abdominal wall: Secondary | ICD-10-CM | POA: Diagnosis not present

## 2015-06-07 LAB — GLUCOSE, CAPILLARY: GLUCOSE-CAPILLARY: 194 mg/dL — AB (ref 65–99)

## 2015-06-08 DIAGNOSIS — L02211 Cutaneous abscess of abdominal wall: Secondary | ICD-10-CM | POA: Diagnosis not present

## 2015-06-09 ENCOUNTER — Encounter
Admission: RE | Admit: 2015-06-09 | Discharge: 2015-06-09 | Disposition: A | Discharge status: Home / Self Care | Payer: Medicaid Other | Source: Ambulatory Visit | Attending: Internal Medicine | Admitting: Internal Medicine

## 2015-06-09 ENCOUNTER — Other Ambulatory Visit: Payer: Self-pay | Admitting: Infectious Diseases

## 2015-06-09 DIAGNOSIS — Z79899 Other long term (current) drug therapy: Secondary | ICD-10-CM | POA: Insufficient documentation

## 2015-06-09 DIAGNOSIS — K651 Peritoneal abscess: Secondary | ICD-10-CM | POA: Insufficient documentation

## 2015-06-09 LAB — GLUCOSE, CAPILLARY
GLUCOSE-CAPILLARY: 280 mg/dL — AB (ref 65–99)
GLUCOSE-CAPILLARY: 303 mg/dL — AB (ref 65–99)
Glucose-Capillary: 231 mg/dL — ABNORMAL HIGH (ref 65–99)
Glucose-Capillary: 334 mg/dL — ABNORMAL HIGH (ref 65–99)
Glucose-Capillary: 296 mg/dL — ABNORMAL HIGH (ref 65–99)

## 2015-06-10 ENCOUNTER — Other Ambulatory Visit: Payer: Self-pay | Admitting: Infectious Diseases

## 2015-06-10 DIAGNOSIS — K651 Peritoneal abscess: Secondary | ICD-10-CM | POA: Diagnosis not present

## 2015-06-10 DIAGNOSIS — Z9889 Other specified postprocedural states: Secondary | ICD-10-CM

## 2015-06-10 DIAGNOSIS — Z79899 Other long term (current) drug therapy: Secondary | ICD-10-CM | POA: Diagnosis present

## 2015-06-10 LAB — GLUCOSE, CAPILLARY
Glucose-Capillary: 245 mg/dL — ABNORMAL HIGH (ref 65–99)
Glucose-Capillary: 281 mg/dL — ABNORMAL HIGH (ref 65–99)

## 2015-06-11 ENCOUNTER — Other Ambulatory Visit
Admission: RE | Admit: 2015-06-11 | Discharge: 2015-06-11 | Disposition: A | Discharge status: Home / Self Care | Payer: Medicaid Other | Source: Skilled Nursing Facility | Attending: Internal Medicine | Admitting: Internal Medicine

## 2015-06-11 DIAGNOSIS — K651 Peritoneal abscess: Secondary | ICD-10-CM | POA: Diagnosis not present

## 2015-06-11 DIAGNOSIS — L02211 Cutaneous abscess of abdominal wall: Secondary | ICD-10-CM | POA: Diagnosis not present

## 2015-06-11 LAB — COMPREHENSIVE METABOLIC PANEL
ALBUMIN: 3.3 g/dL — AB (ref 3.5–5.0)
ALK PHOS: 69 U/L (ref 38–126)
ALT: 18 U/L (ref 14–54)
ANION GAP: 7 (ref 5–15)
AST: 14 U/L — AB (ref 15–41)
BUN: 25 mg/dL — AB (ref 6–20)
CALCIUM: 9.2 mg/dL (ref 8.9–10.3)
CO2: 28 mmol/L (ref 22–32)
Chloride: 105 mmol/L (ref 101–111)
GLUCOSE: 166 mg/dL — AB (ref 65–99)
Potassium: 3.5 mmol/L (ref 3.5–5.1)
SODIUM: 140 mmol/L (ref 135–145)
Total Bilirubin: 0.4 mg/dL (ref 0.3–1.2)
Total Protein: 6.7 g/dL (ref 6.5–8.1)

## 2015-06-11 LAB — CBC WITH DIFFERENTIAL/PLATELET
BASOS ABS: 0.1 10*3/uL (ref 0–0.1)
BASOS PCT: 1 %
EOS ABS: 0.2 10*3/uL (ref 0–0.7)
Eosinophils Relative: 4 %
HCT: 36.7 % (ref 35.0–47.0)
HEMOGLOBIN: 12.2 g/dL (ref 12.0–16.0)
Lymphocytes Relative: 40 %
Lymphs Abs: 2.2 10*3/uL (ref 1.0–3.6)
MCH: 30.1 pg (ref 26.0–34.0)
MCHC: 33.2 g/dL (ref 32.0–36.0)
MCV: 90.5 fL (ref 80.0–100.0)
Monocytes Absolute: 0.7 10*3/uL (ref 0.2–0.9)
Monocytes Relative: 13 %
NEUTROS PCT: 42 %
Neutro Abs: 2.3 10*3/uL (ref 1.4–6.5)
Platelets: 274 10*3/uL (ref 150–440)
RBC: 4.06 MIL/uL (ref 3.80–5.20)
RDW: 16.4 % — ABNORMAL HIGH (ref 11.5–14.5)
WBC: 5.6 10*3/uL (ref 3.6–11.0)

## 2015-06-11 LAB — GLUCOSE, CAPILLARY
Glucose-Capillary: 304 mg/dL — ABNORMAL HIGH (ref 65–99)
Glucose-Capillary: 184 mg/dL — ABNORMAL HIGH (ref 65–99)

## 2015-06-12 DIAGNOSIS — K651 Peritoneal abscess: Secondary | ICD-10-CM | POA: Diagnosis not present

## 2015-06-13 DIAGNOSIS — K651 Peritoneal abscess: Secondary | ICD-10-CM | POA: Diagnosis not present

## 2015-06-13 LAB — GLUCOSE, CAPILLARY
GLUCOSE-CAPILLARY: 320 mg/dL — AB (ref 65–99)
GLUCOSE-CAPILLARY: 330 mg/dL — AB (ref 65–99)
Glucose-Capillary: 406 mg/dL — ABNORMAL HIGH (ref 65–99)
Glucose-Capillary: 283 mg/dL — ABNORMAL HIGH (ref 65–99)

## 2015-06-14 LAB — GLUCOSE, CAPILLARY
GLUCOSE-CAPILLARY: 343 mg/dL — AB (ref 65–99)
GLUCOSE-CAPILLARY: 361 mg/dL — AB (ref 65–99)
GLUCOSE-CAPILLARY: 408 mg/dL — AB (ref 65–99)
GLUCOSE-CAPILLARY: 351 mg/dL — AB (ref 65–99)
Glucose-Capillary: 290 mg/dL — ABNORMAL HIGH (ref 65–99)
Glucose-Capillary: 279 mg/dL — ABNORMAL HIGH (ref 65–99)
Glucose-Capillary: 275 mg/dL — ABNORMAL HIGH (ref 65–99)
Glucose-Capillary: 328 mg/dL — ABNORMAL HIGH (ref 65–99)

## 2015-06-15 ENCOUNTER — Ambulatory Visit
Admission: RE | Admit: 2015-06-15 | Discharge: 2015-06-15 | Disposition: A | Discharge status: Home / Self Care | Payer: Medicaid Other | Source: Ambulatory Visit | Attending: Internal Medicine | Admitting: Internal Medicine

## 2015-06-15 ENCOUNTER — Ambulatory Visit
Admission: RE | Admit: 2015-06-15 | Discharge: 2015-06-15 | Disposition: A | Discharge status: Home / Self Care | Payer: Medicaid Other | Source: Ambulatory Visit | Attending: Infectious Diseases | Admitting: Infectious Diseases

## 2015-06-15 ENCOUNTER — Other Ambulatory Visit: Payer: Self-pay | Admitting: Infectious Diseases

## 2015-06-15 DIAGNOSIS — Z9889 Other specified postprocedural states: Secondary | ICD-10-CM

## 2015-06-15 DIAGNOSIS — Z09 Encounter for follow-up examination after completed treatment for conditions other than malignant neoplasm: Secondary | ICD-10-CM | POA: Diagnosis present

## 2015-06-15 DIAGNOSIS — Z9689 Presence of other specified functional implants: Secondary | ICD-10-CM | POA: Insufficient documentation

## 2015-06-15 DIAGNOSIS — K651 Peritoneal abscess: Secondary | ICD-10-CM | POA: Diagnosis not present

## 2015-06-15 DIAGNOSIS — K8020 Calculus of gallbladder without cholecystitis without obstruction: Secondary | ICD-10-CM | POA: Insufficient documentation

## 2015-06-15 DIAGNOSIS — N151 Renal and perinephric abscess: Secondary | ICD-10-CM | POA: Diagnosis present

## 2015-06-15 DIAGNOSIS — A419 Sepsis, unspecified organism: Secondary | ICD-10-CM

## 2015-06-15 DIAGNOSIS — N739 Female pelvic inflammatory disease, unspecified: Secondary | ICD-10-CM

## 2015-06-15 LAB — GLUCOSE, CAPILLARY
Glucose-Capillary: 227 mg/dL — ABNORMAL HIGH (ref 65–99)
Glucose-Capillary: 264 mg/dL — ABNORMAL HIGH (ref 65–99)

## 2015-06-15 MED ORDER — IOHEXOL 300 MG/ML  SOLN
100.0000 mL | Freq: Once | INTRAMUSCULAR | Status: AC | PRN
  Administered 2015-06-15: 100 mL via INTRAVENOUS

## 2015-06-16 DIAGNOSIS — K651 Peritoneal abscess: Secondary | ICD-10-CM | POA: Diagnosis not present

## 2015-06-16 LAB — GLUCOSE, CAPILLARY
GLUCOSE-CAPILLARY: 272 mg/dL — AB (ref 65–99)
GLUCOSE-CAPILLARY: 216 mg/dL — AB (ref 65–99)

## 2015-06-17 ENCOUNTER — Ambulatory Visit
Admission: RE | Admit: 2015-06-17 | Discharge: 2015-06-17 | Disposition: A | Discharge status: Home / Self Care | Payer: Medicaid Other | Source: Ambulatory Visit | Attending: Infectious Diseases | Admitting: Infectious Diseases

## 2015-06-17 DIAGNOSIS — Z4803 Encounter for change or removal of drains: Secondary | ICD-10-CM | POA: Diagnosis present

## 2015-06-17 DIAGNOSIS — Z7982 Long term (current) use of aspirin: Secondary | ICD-10-CM | POA: Insufficient documentation

## 2015-06-17 DIAGNOSIS — L02211 Cutaneous abscess of abdominal wall: Secondary | ICD-10-CM | POA: Insufficient documentation

## 2015-06-17 DIAGNOSIS — Z9889 Other specified postprocedural states: Secondary | ICD-10-CM | POA: Diagnosis not present

## 2015-06-17 DIAGNOSIS — G809 Cerebral palsy, unspecified: Secondary | ICD-10-CM | POA: Diagnosis not present

## 2015-06-17 DIAGNOSIS — Z803 Family history of malignant neoplasm of breast: Secondary | ICD-10-CM | POA: Insufficient documentation

## 2015-06-17 DIAGNOSIS — Z888 Allergy status to other drugs, medicaments and biological substances status: Secondary | ICD-10-CM | POA: Insufficient documentation

## 2015-06-17 DIAGNOSIS — Z833 Family history of diabetes mellitus: Secondary | ICD-10-CM | POA: Diagnosis not present

## 2015-06-17 DIAGNOSIS — Z90710 Acquired absence of both cervix and uterus: Secondary | ICD-10-CM | POA: Insufficient documentation

## 2015-06-17 DIAGNOSIS — Z79899 Other long term (current) drug therapy: Secondary | ICD-10-CM | POA: Insufficient documentation

## 2015-06-17 DIAGNOSIS — B9620 Unspecified Escherichia coli [E. coli] as the cause of diseases classified elsewhere: Secondary | ICD-10-CM | POA: Diagnosis not present

## 2015-06-17 DIAGNOSIS — I10 Essential (primary) hypertension: Secondary | ICD-10-CM | POA: Diagnosis not present

## 2015-06-17 DIAGNOSIS — Z8249 Family history of ischemic heart disease and other diseases of the circulatory system: Secondary | ICD-10-CM | POA: Diagnosis not present

## 2015-06-17 DIAGNOSIS — Z882 Allergy status to sulfonamides status: Secondary | ICD-10-CM | POA: Diagnosis not present

## 2015-06-17 DIAGNOSIS — Z794 Long term (current) use of insulin: Secondary | ICD-10-CM | POA: Insufficient documentation

## 2015-06-17 DIAGNOSIS — E119 Type 2 diabetes mellitus without complications: Secondary | ICD-10-CM | POA: Insufficient documentation

## 2015-06-17 MED ORDER — SODIUM CHLORIDE 0.9 % IJ SOLN
INTRAMUSCULAR | Status: AC
  Filled 2015-06-17: qty 6

## 2015-06-17 MED ORDER — FENTANYL CITRATE (PF) 100 MCG/2ML IJ SOLN
INTRAMUSCULAR | Status: AC
  Filled 2015-06-17: qty 4

## 2015-06-17 MED ORDER — FENTANYL CITRATE (PF) 100 MCG/2ML IJ SOLN
INTRAMUSCULAR | Status: AC | PRN
  Administered 2015-06-17: 50 ug via INTRAVENOUS

## 2015-06-17 MED ORDER — SODIUM CHLORIDE 0.9 % IV SOLN: INTRAVENOUS | Status: DC

## 2015-06-17 MED ORDER — MIDAZOLAM HCL 5 MG/5ML IJ SOLN
INTRAMUSCULAR | Status: AC
  Filled 2015-06-17: qty 5

## 2015-06-17 MED ORDER — HEPARIN SOD (PORK) LOCK FLUSH 100 UNIT/ML IV SOLN
INTRAVENOUS | Status: AC
  Filled 2015-06-17: qty 5

## 2015-06-17 MED ORDER — MIDAZOLAM HCL 5 MG/5ML IJ SOLN
INTRAMUSCULAR | Status: AC | PRN
  Administered 2015-06-17 (×2): 1 mg via INTRAVENOUS

## 2015-06-18 DIAGNOSIS — K651 Peritoneal abscess: Secondary | ICD-10-CM | POA: Diagnosis not present

## 2015-06-18 LAB — COMPREHENSIVE METABOLIC PANEL
ALBUMIN: 3.6 g/dL (ref 3.5–5.0)
ALK PHOS: 84 U/L (ref 38–126)
ALT: 12 U/L — ABNORMAL LOW (ref 14–54)
ANION GAP: 11 (ref 5–15)
AST: 17 U/L (ref 15–41)
BUN: 30 mg/dL — AB (ref 6–20)
CALCIUM: 9.3 mg/dL (ref 8.9–10.3)
CO2: 27 mmol/L (ref 22–32)
Chloride: 102 mmol/L (ref 101–111)
Creatinine, Ser: 0.41 mg/dL — ABNORMAL LOW (ref 0.44–1.00)
GFR calc Af Amer: >60 mL/min (ref >60)
GFR calc non Af Amer: >60 mL/min (ref >60)
GLUCOSE: 223 mg/dL — AB (ref 65–99)
Potassium: 2.9 mmol/L — CL (ref 3.5–5.1)
SODIUM: 140 mmol/L (ref 135–145)
Total Bilirubin: 0.5 mg/dL (ref 0.3–1.2)
Total Protein: 7 g/dL (ref 6.5–8.1)

## 2015-06-18 LAB — CBC WITH DIFFERENTIAL/PLATELET
BASOS ABS: 0.1 10*3/uL (ref 0–0.1)
BASOS PCT: 1 %
EOS ABS: 0.2 10*3/uL (ref 0–0.7)
Eosinophils Relative: 2 %
HCT: 37.6 % (ref 35.0–47.0)
HEMOGLOBIN: 12.3 g/dL (ref 12.0–16.0)
Lymphocytes Relative: 40 %
Lymphs Abs: 2.7 10*3/uL (ref 1.0–3.6)
MCH: 30.1 pg (ref 26.0–34.0)
MCHC: 32.7 g/dL (ref 32.0–36.0)
MCV: 91.9 fL (ref 80.0–100.0)
Monocytes Absolute: 0.8 10*3/uL (ref 0.2–0.9)
Monocytes Relative: 12 %
NEUTROS ABS: 3.1 10*3/uL (ref 1.4–6.5)
NEUTROS PCT: 45 %
Platelets: 346 10*3/uL (ref 150–440)
RBC: 4.09 MIL/uL (ref 3.80–5.20)
RDW: 16 % — ABNORMAL HIGH (ref 11.5–14.5)
WBC: 6.8 10*3/uL (ref 3.6–11.0)

## 2015-06-18 LAB — GLUCOSE, CAPILLARY
GLUCOSE-CAPILLARY: 219 mg/dL — AB (ref 65–99)
GLUCOSE-CAPILLARY: 343 mg/dL — AB (ref 65–99)
GLUCOSE-CAPILLARY: 232 mg/dL — AB (ref 65–99)
Glucose-Capillary: 167 mg/dL — ABNORMAL HIGH (ref 65–99)
Glucose-Capillary: 199 mg/dL — ABNORMAL HIGH (ref 65–99)
Glucose-Capillary: 334 mg/dL — ABNORMAL HIGH (ref 65–99)
Glucose-Capillary: 367 mg/dL — ABNORMAL HIGH (ref 65–99)

## 2015-06-19 DIAGNOSIS — K651 Peritoneal abscess: Secondary | ICD-10-CM | POA: Diagnosis not present

## 2015-06-19 LAB — GLUCOSE, CAPILLARY
Glucose-Capillary: 166 mg/dL — ABNORMAL HIGH (ref 65–99)
Glucose-Capillary: 267 mg/dL — ABNORMAL HIGH (ref 65–99)
Glucose-Capillary: 318 mg/dL — ABNORMAL HIGH (ref 65–99)

## 2015-06-20 DIAGNOSIS — K651 Peritoneal abscess: Secondary | ICD-10-CM | POA: Diagnosis not present

## 2015-06-20 LAB — GLUCOSE, CAPILLARY
GLUCOSE-CAPILLARY: 233 mg/dL — AB (ref 65–99)
GLUCOSE-CAPILLARY: 297 mg/dL — AB (ref 65–99)
GLUCOSE-CAPILLARY: 267 mg/dL — AB (ref 65–99)

## 2015-06-21 DIAGNOSIS — K651 Peritoneal abscess: Secondary | ICD-10-CM | POA: Diagnosis not present

## 2015-06-21 LAB — GLUCOSE, CAPILLARY
Glucose-Capillary: 207 mg/dL — ABNORMAL HIGH (ref 65–99)
Glucose-Capillary: 253 mg/dL — ABNORMAL HIGH (ref 65–99)

## 2015-06-22 DIAGNOSIS — K651 Peritoneal abscess: Secondary | ICD-10-CM | POA: Diagnosis not present

## 2015-06-23 LAB — GLUCOSE, CAPILLARY
GLUCOSE-CAPILLARY: 256 mg/dL — AB (ref 65–99)
GLUCOSE-CAPILLARY: 257 mg/dL — AB (ref 65–99)
Glucose-Capillary: 325 mg/dL — ABNORMAL HIGH (ref 65–99)
Glucose-Capillary: 234 mg/dL — ABNORMAL HIGH (ref 65–99)
Glucose-Capillary: 252 mg/dL — ABNORMAL HIGH (ref 65–99)
Glucose-Capillary: 301 mg/dL — ABNORMAL HIGH (ref 65–99)
Glucose-Capillary: 251 mg/dL — ABNORMAL HIGH (ref 65–99)

## 2015-06-24 DIAGNOSIS — K651 Peritoneal abscess: Secondary | ICD-10-CM | POA: Diagnosis not present

## 2015-06-24 LAB — GLUCOSE, CAPILLARY
Glucose-Capillary: 221 mg/dL — ABNORMAL HIGH (ref 65–99)
Glucose-Capillary: 298 mg/dL — ABNORMAL HIGH (ref 65–99)
Glucose-Capillary: 265 mg/dL — ABNORMAL HIGH (ref 65–99)

## 2015-06-25 ENCOUNTER — Other Ambulatory Visit: Payer: Self-pay | Admitting: Infectious Diseases

## 2015-06-25 DIAGNOSIS — IMO0002 Reserved for concepts with insufficient information to code with codable children: Secondary | ICD-10-CM

## 2015-06-25 DIAGNOSIS — K651 Peritoneal abscess: Secondary | ICD-10-CM | POA: Diagnosis not present

## 2015-06-25 LAB — GLUCOSE, CAPILLARY
GLUCOSE-CAPILLARY: 232 mg/dL — AB (ref 65–99)
GLUCOSE-CAPILLARY: 226 mg/dL — AB (ref 65–99)

## 2015-06-26 DIAGNOSIS — K651 Peritoneal abscess: Secondary | ICD-10-CM | POA: Diagnosis not present

## 2015-06-27 DIAGNOSIS — K651 Peritoneal abscess: Secondary | ICD-10-CM | POA: Diagnosis not present

## 2015-06-27 LAB — GLUCOSE, CAPILLARY
GLUCOSE-CAPILLARY: 236 mg/dL — AB (ref 65–99)
GLUCOSE-CAPILLARY: 338 mg/dL — AB (ref 65–99)
GLUCOSE-CAPILLARY: 221 mg/dL — AB (ref 65–99)
GLUCOSE-CAPILLARY: 253 mg/dL — AB (ref 65–99)

## 2015-06-28 LAB — GLUCOSE, CAPILLARY
GLUCOSE-CAPILLARY: 257 mg/dL — AB (ref 65–99)
GLUCOSE-CAPILLARY: 248 mg/dL — AB (ref 65–99)
GLUCOSE-CAPILLARY: 277 mg/dL — AB (ref 65–99)
GLUCOSE-CAPILLARY: 193 mg/dL — AB (ref 65–99)
GLUCOSE-CAPILLARY: 211 mg/dL — AB (ref 65–99)
GLUCOSE-CAPILLARY: 223 mg/dL — AB (ref 65–99)
Glucose-Capillary: 310 mg/dL — ABNORMAL HIGH (ref 65–99)

## 2015-06-29 DIAGNOSIS — K651 Peritoneal abscess: Secondary | ICD-10-CM | POA: Diagnosis not present

## 2015-06-29 LAB — GLUCOSE, CAPILLARY
Glucose-Capillary: 232 mg/dL — ABNORMAL HIGH (ref 65–99)
Glucose-Capillary: 199 mg/dL — ABNORMAL HIGH (ref 65–99)
Glucose-Capillary: 340 mg/dL — ABNORMAL HIGH (ref 65–99)

## 2015-06-30 DIAGNOSIS — K651 Peritoneal abscess: Secondary | ICD-10-CM | POA: Diagnosis not present

## 2015-06-30 LAB — GLUCOSE, CAPILLARY
Glucose-Capillary: 271 mg/dL — ABNORMAL HIGH (ref 65–99)
Glucose-Capillary: 255 mg/dL — ABNORMAL HIGH (ref 65–99)

## 2015-07-01 DIAGNOSIS — K651 Peritoneal abscess: Secondary | ICD-10-CM | POA: Diagnosis not present

## 2015-07-01 LAB — GLUCOSE, CAPILLARY
Glucose-Capillary: 291 mg/dL — ABNORMAL HIGH (ref 65–99)
Glucose-Capillary: 205 mg/dL — ABNORMAL HIGH (ref 65–99)
Glucose-Capillary: 266 mg/dL — ABNORMAL HIGH (ref 65–99)

## 2015-07-02 DIAGNOSIS — K651 Peritoneal abscess: Secondary | ICD-10-CM | POA: Diagnosis not present

## 2015-07-02 LAB — GLUCOSE, CAPILLARY
GLUCOSE-CAPILLARY: 194 mg/dL — AB (ref 65–99)
Glucose-Capillary: 206 mg/dL — ABNORMAL HIGH (ref 65–99)
Glucose-Capillary: 316 mg/dL — ABNORMAL HIGH (ref 65–99)
Glucose-Capillary: 306 mg/dL — ABNORMAL HIGH (ref 65–99)

## 2015-07-03 DIAGNOSIS — K651 Peritoneal abscess: Secondary | ICD-10-CM | POA: Diagnosis not present

## 2015-07-03 LAB — GLUCOSE, CAPILLARY
Glucose-Capillary: 123 mg/dL — ABNORMAL HIGH (ref 65–99)
Glucose-Capillary: 198 mg/dL — ABNORMAL HIGH (ref 65–99)
Glucose-Capillary: 219 mg/dL — ABNORMAL HIGH (ref 65–99)
Glucose-Capillary: 253 mg/dL — ABNORMAL HIGH (ref 65–99)

## 2015-07-04 DIAGNOSIS — K651 Peritoneal abscess: Secondary | ICD-10-CM | POA: Diagnosis not present

## 2015-07-04 LAB — GLUCOSE, CAPILLARY
GLUCOSE-CAPILLARY: 168 mg/dL — AB (ref 65–99)
Glucose-Capillary: 199 mg/dL — ABNORMAL HIGH (ref 65–99)

## 2015-07-05 DIAGNOSIS — K651 Peritoneal abscess: Secondary | ICD-10-CM | POA: Diagnosis not present

## 2015-07-05 LAB — GLUCOSE, CAPILLARY
GLUCOSE-CAPILLARY: 52 mg/dL — AB (ref 65–99)
GLUCOSE-CAPILLARY: 199 mg/dL — AB (ref 65–99)

## 2015-07-06 DIAGNOSIS — K651 Peritoneal abscess: Secondary | ICD-10-CM | POA: Diagnosis not present

## 2015-07-07 DIAGNOSIS — K651 Peritoneal abscess: Secondary | ICD-10-CM | POA: Diagnosis not present

## 2015-07-07 LAB — GLUCOSE, CAPILLARY
GLUCOSE-CAPILLARY: 186 mg/dL — AB (ref 65–99)
GLUCOSE-CAPILLARY: 187 mg/dL — AB (ref 65–99)
Glucose-Capillary: 165 mg/dL — ABNORMAL HIGH (ref 65–99)
Glucose-Capillary: 264 mg/dL — ABNORMAL HIGH (ref 65–99)

## 2015-07-08 ENCOUNTER — Ambulatory Visit
Admission: RE | Admit: 2015-07-08 | Discharge: 2015-07-08 | Disposition: A | Discharge status: Home / Self Care | Payer: Medicaid Other | Source: Ambulatory Visit | Attending: Infectious Diseases | Admitting: Infectious Diseases

## 2015-07-08 DIAGNOSIS — K651 Peritoneal abscess: Secondary | ICD-10-CM | POA: Insufficient documentation

## 2015-07-08 DIAGNOSIS — N200 Calculus of kidney: Secondary | ICD-10-CM | POA: Insufficient documentation

## 2015-07-08 DIAGNOSIS — K8020 Calculus of gallbladder without cholecystitis without obstruction: Secondary | ICD-10-CM | POA: Insufficient documentation

## 2015-07-08 DIAGNOSIS — IMO0002 Reserved for concepts with insufficient information to code with codable children: Secondary | ICD-10-CM

## 2015-07-08 LAB — GLUCOSE, CAPILLARY
GLUCOSE-CAPILLARY: 214 mg/dL — AB (ref 65–99)
GLUCOSE-CAPILLARY: 263 mg/dL — AB (ref 65–99)
GLUCOSE-CAPILLARY: 304 mg/dL — AB (ref 65–99)
Glucose-Capillary: 225 mg/dL — ABNORMAL HIGH (ref 65–99)
Glucose-Capillary: 217 mg/dL — ABNORMAL HIGH (ref 65–99)

## 2015-07-08 MED ORDER — IOHEXOL 300 MG/ML  SOLN
100.0000 mL | Freq: Once | INTRAMUSCULAR | Status: AC | PRN
  Administered 2015-07-08: 100 mL via INTRAVENOUS

## 2015-07-09 ENCOUNTER — Encounter
Admission: RE | Admit: 2015-07-09 | Discharge: 2015-07-09 | Disposition: A | Discharge status: Home / Self Care | Payer: Medicaid Other | Source: Ambulatory Visit | Attending: Internal Medicine | Admitting: Internal Medicine

## 2015-07-09 DIAGNOSIS — Z794 Long term (current) use of insulin: Secondary | ICD-10-CM | POA: Diagnosis not present

## 2015-07-09 DIAGNOSIS — E876 Hypokalemia: Secondary | ICD-10-CM | POA: Insufficient documentation

## 2015-07-09 DIAGNOSIS — E119 Type 2 diabetes mellitus without complications: Secondary | ICD-10-CM | POA: Diagnosis present

## 2015-07-09 LAB — GLUCOSE, CAPILLARY
GLUCOSE-CAPILLARY: 241 mg/dL — AB (ref 65–99)
GLUCOSE-CAPILLARY: 259 mg/dL — AB (ref 65–99)

## 2015-07-15 ENCOUNTER — Other Ambulatory Visit: Payer: Self-pay | Admitting: Infectious Diseases

## 2015-07-15 DIAGNOSIS — IMO0002 Reserved for concepts with insufficient information to code with codable children: Secondary | ICD-10-CM

## 2015-07-16 ENCOUNTER — Ambulatory Visit
Admission: RE | Admit: 2015-07-16 | Discharge: 2015-07-16 | Disposition: A | Discharge status: Home / Self Care | Payer: Medicaid Other | Source: Ambulatory Visit | Attending: Infectious Diseases | Admitting: Infectious Diseases

## 2015-07-16 DIAGNOSIS — IMO0002 Reserved for concepts with insufficient information to code with codable children: Secondary | ICD-10-CM

## 2015-07-28 DIAGNOSIS — E876 Hypokalemia: Secondary | ICD-10-CM | POA: Diagnosis not present

## 2015-07-28 LAB — COMPREHENSIVE METABOLIC PANEL
ALBUMIN: 3.7 g/dL (ref 3.5–5.0)
ALT: 27 U/L (ref 14–54)
AST: 20 U/L (ref 15–41)
Alkaline Phosphatase: 92 U/L (ref 38–126)
Anion gap: 8 (ref 5–15)
BUN: 27 mg/dL — AB (ref 6–20)
CHLORIDE: 101 mmol/L (ref 101–111)
CO2: 32 mmol/L (ref 22–32)
Calcium: 9.3 mg/dL (ref 8.9–10.3)
Creatinine, Ser: <0.3 mg/dL — ABNORMAL LOW (ref 0.44–1.00)
Glucose, Bld: 95 mg/dL (ref 65–99)
POTASSIUM: 3.7 mmol/L (ref 3.5–5.1)
SODIUM: 141 mmol/L (ref 135–145)
Total Bilirubin: 0.6 mg/dL (ref 0.3–1.2)
Total Protein: 6.9 g/dL (ref 6.5–8.1)

## 2015-07-31 DIAGNOSIS — E876 Hypokalemia: Secondary | ICD-10-CM | POA: Diagnosis not present

## 2015-07-31 LAB — GLUCOSE, CAPILLARY
GLUCOSE-CAPILLARY: 190 mg/dL — AB (ref 65–99)
GLUCOSE-CAPILLARY: 269 mg/dL — AB (ref 65–99)
Glucose-Capillary: 209 mg/dL — ABNORMAL HIGH (ref 65–99)

## 2015-08-01 DIAGNOSIS — E876 Hypokalemia: Secondary | ICD-10-CM | POA: Diagnosis not present

## 2015-08-01 LAB — GLUCOSE, CAPILLARY
Glucose-Capillary: 137 mg/dL — ABNORMAL HIGH (ref 65–99)
Glucose-Capillary: 228 mg/dL — ABNORMAL HIGH (ref 65–99)

## 2015-08-02 DIAGNOSIS — E876 Hypokalemia: Secondary | ICD-10-CM | POA: Diagnosis not present

## 2015-08-02 LAB — GLUCOSE, CAPILLARY
GLUCOSE-CAPILLARY: 259 mg/dL — AB (ref 65–99)
Glucose-Capillary: 217 mg/dL — ABNORMAL HIGH (ref 65–99)

## 2015-08-03 DIAGNOSIS — E876 Hypokalemia: Secondary | ICD-10-CM | POA: Diagnosis not present

## 2015-08-03 LAB — GLUCOSE, CAPILLARY
GLUCOSE-CAPILLARY: 184 mg/dL — AB (ref 65–99)
GLUCOSE-CAPILLARY: 258 mg/dL — AB (ref 65–99)
Glucose-Capillary: 227 mg/dL — ABNORMAL HIGH (ref 65–99)

## 2015-08-04 DIAGNOSIS — E876 Hypokalemia: Secondary | ICD-10-CM | POA: Diagnosis not present

## 2015-08-04 LAB — GLUCOSE, CAPILLARY
GLUCOSE-CAPILLARY: 283 mg/dL — AB (ref 65–99)
GLUCOSE-CAPILLARY: 187 mg/dL — AB (ref 65–99)
Glucose-Capillary: 295 mg/dL — ABNORMAL HIGH (ref 65–99)
Glucose-Capillary: 268 mg/dL — ABNORMAL HIGH (ref 65–99)

## 2015-08-05 DIAGNOSIS — E876 Hypokalemia: Secondary | ICD-10-CM | POA: Diagnosis not present

## 2015-08-05 LAB — GLUCOSE, CAPILLARY
GLUCOSE-CAPILLARY: 248 mg/dL — AB (ref 65–99)
GLUCOSE-CAPILLARY: 241 mg/dL — AB (ref 65–99)
Glucose-Capillary: 230 mg/dL — ABNORMAL HIGH (ref 65–99)

## 2015-08-06 DIAGNOSIS — E876 Hypokalemia: Secondary | ICD-10-CM | POA: Diagnosis not present

## 2015-08-06 LAB — GLUCOSE, CAPILLARY
GLUCOSE-CAPILLARY: 197 mg/dL — AB (ref 65–99)
GLUCOSE-CAPILLARY: 225 mg/dL — AB (ref 65–99)

## 2015-08-07 DIAGNOSIS — E876 Hypokalemia: Secondary | ICD-10-CM | POA: Diagnosis not present

## 2015-08-08 DIAGNOSIS — E876 Hypokalemia: Secondary | ICD-10-CM | POA: Diagnosis not present

## 2015-08-08 LAB — GLUCOSE, CAPILLARY
GLUCOSE-CAPILLARY: 176 mg/dL — AB (ref 65–99)
Glucose-Capillary: 189 mg/dL — ABNORMAL HIGH (ref 65–99)

## 2015-08-09 ENCOUNTER — Encounter
Admission: RE | Admit: 2015-08-09 | Discharge: 2015-08-09 | Disposition: A | Discharge status: Home / Self Care | Payer: Medicaid Other | Source: Ambulatory Visit | Attending: Internal Medicine | Admitting: Internal Medicine

## 2015-08-09 DIAGNOSIS — E119 Type 2 diabetes mellitus without complications: Secondary | ICD-10-CM | POA: Insufficient documentation

## 2015-08-09 DIAGNOSIS — Z794 Long term (current) use of insulin: Secondary | ICD-10-CM | POA: Insufficient documentation

## 2015-08-09 LAB — GLUCOSE, CAPILLARY
GLUCOSE-CAPILLARY: 247 mg/dL — AB (ref 65–99)
Glucose-Capillary: 144 mg/dL — ABNORMAL HIGH (ref 65–99)
Glucose-Capillary: 210 mg/dL — ABNORMAL HIGH (ref 65–99)
Glucose-Capillary: 256 mg/dL — ABNORMAL HIGH (ref 65–99)

## 2015-08-10 DIAGNOSIS — Z794 Long term (current) use of insulin: Secondary | ICD-10-CM | POA: Diagnosis not present

## 2015-08-10 DIAGNOSIS — E119 Type 2 diabetes mellitus without complications: Secondary | ICD-10-CM | POA: Diagnosis not present

## 2015-08-10 LAB — GLUCOSE, CAPILLARY
GLUCOSE-CAPILLARY: 205 mg/dL — AB (ref 65–99)
GLUCOSE-CAPILLARY: 185 mg/dL — AB (ref 65–99)
GLUCOSE-CAPILLARY: 258 mg/dL — AB (ref 65–99)

## 2015-08-11 DIAGNOSIS — E119 Type 2 diabetes mellitus without complications: Secondary | ICD-10-CM | POA: Diagnosis not present

## 2015-08-11 LAB — GLUCOSE, CAPILLARY
Glucose-Capillary: 212 mg/dL — ABNORMAL HIGH (ref 65–99)
Glucose-Capillary: 219 mg/dL — ABNORMAL HIGH (ref 65–99)

## 2015-08-12 DIAGNOSIS — E119 Type 2 diabetes mellitus without complications: Secondary | ICD-10-CM | POA: Diagnosis not present

## 2015-08-12 LAB — GLUCOSE, CAPILLARY
GLUCOSE-CAPILLARY: 212 mg/dL — AB (ref 65–99)
Glucose-Capillary: 186 mg/dL — ABNORMAL HIGH (ref 65–99)
Glucose-Capillary: 232 mg/dL — ABNORMAL HIGH (ref 65–99)

## 2015-08-13 DIAGNOSIS — E119 Type 2 diabetes mellitus without complications: Secondary | ICD-10-CM | POA: Diagnosis not present

## 2015-08-13 LAB — GLUCOSE, CAPILLARY: Glucose-Capillary: 139 mg/dL — ABNORMAL HIGH (ref 65–99)

## 2015-08-14 DIAGNOSIS — E119 Type 2 diabetes mellitus without complications: Secondary | ICD-10-CM | POA: Diagnosis not present

## 2015-08-14 LAB — GLUCOSE, CAPILLARY
GLUCOSE-CAPILLARY: 153 mg/dL — AB (ref 65–99)
GLUCOSE-CAPILLARY: 250 mg/dL — AB (ref 65–99)
GLUCOSE-CAPILLARY: 235 mg/dL — AB (ref 65–99)
GLUCOSE-CAPILLARY: 204 mg/dL — AB (ref 65–99)
Glucose-Capillary: 273 mg/dL — ABNORMAL HIGH (ref 65–99)

## 2015-08-15 DIAGNOSIS — E119 Type 2 diabetes mellitus without complications: Secondary | ICD-10-CM | POA: Diagnosis not present

## 2015-08-15 LAB — GLUCOSE, CAPILLARY
GLUCOSE-CAPILLARY: 160 mg/dL — AB (ref 65–99)
GLUCOSE-CAPILLARY: 174 mg/dL — AB (ref 65–99)
GLUCOSE-CAPILLARY: 229 mg/dL — AB (ref 65–99)
Glucose-Capillary: 246 mg/dL — ABNORMAL HIGH (ref 65–99)

## 2015-08-16 DIAGNOSIS — E119 Type 2 diabetes mellitus without complications: Secondary | ICD-10-CM | POA: Diagnosis not present

## 2015-08-16 LAB — GLUCOSE, CAPILLARY
GLUCOSE-CAPILLARY: 230 mg/dL — AB (ref 65–99)
Glucose-Capillary: 242 mg/dL — ABNORMAL HIGH (ref 65–99)

## 2015-08-17 DIAGNOSIS — E119 Type 2 diabetes mellitus without complications: Secondary | ICD-10-CM | POA: Diagnosis not present

## 2015-08-18 DIAGNOSIS — E119 Type 2 diabetes mellitus without complications: Secondary | ICD-10-CM | POA: Diagnosis not present

## 2015-08-18 LAB — GLUCOSE, CAPILLARY
GLUCOSE-CAPILLARY: 224 mg/dL — AB (ref 65–99)
GLUCOSE-CAPILLARY: 175 mg/dL — AB (ref 65–99)
Glucose-Capillary: 204 mg/dL — ABNORMAL HIGH (ref 65–99)
Glucose-Capillary: 288 mg/dL — ABNORMAL HIGH (ref 65–99)

## 2015-08-19 LAB — GLUCOSE, CAPILLARY
GLUCOSE-CAPILLARY: 177 mg/dL — AB (ref 65–99)
GLUCOSE-CAPILLARY: 203 mg/dL — AB (ref 65–99)
GLUCOSE-CAPILLARY: 115 mg/dL — AB (ref 65–99)
GLUCOSE-CAPILLARY: 210 mg/dL — AB (ref 65–99)
Glucose-Capillary: 211 mg/dL — ABNORMAL HIGH (ref 65–99)
Glucose-Capillary: 265 mg/dL — ABNORMAL HIGH (ref 65–99)

## 2015-08-20 DIAGNOSIS — E119 Type 2 diabetes mellitus without complications: Secondary | ICD-10-CM | POA: Diagnosis not present

## 2015-08-20 LAB — GLUCOSE, CAPILLARY
GLUCOSE-CAPILLARY: 176 mg/dL — AB (ref 65–99)
Glucose-Capillary: 141 mg/dL — ABNORMAL HIGH (ref 65–99)
Glucose-Capillary: 225 mg/dL — ABNORMAL HIGH (ref 65–99)

## 2015-08-21 DIAGNOSIS — E119 Type 2 diabetes mellitus without complications: Secondary | ICD-10-CM | POA: Diagnosis not present

## 2015-08-21 LAB — GLUCOSE, CAPILLARY
GLUCOSE-CAPILLARY: 212 mg/dL — AB (ref 65–99)
Glucose-Capillary: 160 mg/dL — ABNORMAL HIGH (ref 65–99)

## 2015-08-24 DIAGNOSIS — E119 Type 2 diabetes mellitus without complications: Secondary | ICD-10-CM | POA: Diagnosis not present

## 2015-08-24 LAB — GLUCOSE, CAPILLARY
GLUCOSE-CAPILLARY: 133 mg/dL — AB (ref 65–99)
GLUCOSE-CAPILLARY: 172 mg/dL — AB (ref 65–99)
GLUCOSE-CAPILLARY: 190 mg/dL — AB (ref 65–99)
GLUCOSE-CAPILLARY: 143 mg/dL — AB (ref 65–99)
GLUCOSE-CAPILLARY: 150 mg/dL — AB (ref 65–99)
GLUCOSE-CAPILLARY: 210 mg/dL — AB (ref 65–99)
Glucose-Capillary: 174 mg/dL — ABNORMAL HIGH (ref 65–99)
Glucose-Capillary: 152 mg/dL — ABNORMAL HIGH (ref 65–99)
Glucose-Capillary: 107 mg/dL — ABNORMAL HIGH (ref 65–99)
Glucose-Capillary: 177 mg/dL — ABNORMAL HIGH (ref 65–99)
Glucose-Capillary: 192 mg/dL — ABNORMAL HIGH (ref 65–99)

## 2015-08-25 DIAGNOSIS — E119 Type 2 diabetes mellitus without complications: Secondary | ICD-10-CM | POA: Diagnosis not present

## 2015-08-26 LAB — GLUCOSE, CAPILLARY
GLUCOSE-CAPILLARY: 95 mg/dL (ref 65–99)
GLUCOSE-CAPILLARY: 152 mg/dL — AB (ref 65–99)
GLUCOSE-CAPILLARY: 161 mg/dL — AB (ref 65–99)
GLUCOSE-CAPILLARY: 100 mg/dL — AB (ref 65–99)
Glucose-Capillary: 237 mg/dL — ABNORMAL HIGH (ref 65–99)
Glucose-Capillary: 109 mg/dL — ABNORMAL HIGH (ref 65–99)

## 2015-08-27 DIAGNOSIS — E119 Type 2 diabetes mellitus without complications: Secondary | ICD-10-CM | POA: Diagnosis not present

## 2015-08-27 LAB — GLUCOSE, CAPILLARY
GLUCOSE-CAPILLARY: 289 mg/dL — AB (ref 65–99)
GLUCOSE-CAPILLARY: 241 mg/dL — AB (ref 65–99)
GLUCOSE-CAPILLARY: 189 mg/dL — AB (ref 65–99)
GLUCOSE-CAPILLARY: 152 mg/dL — AB (ref 65–99)
GLUCOSE-CAPILLARY: 180 mg/dL — AB (ref 65–99)
Glucose-Capillary: 260 mg/dL — ABNORMAL HIGH (ref 65–99)

## 2015-08-28 DIAGNOSIS — E119 Type 2 diabetes mellitus without complications: Secondary | ICD-10-CM | POA: Diagnosis not present

## 2015-08-28 LAB — GLUCOSE, CAPILLARY
GLUCOSE-CAPILLARY: 106 mg/dL — AB (ref 65–99)
GLUCOSE-CAPILLARY: 282 mg/dL — AB (ref 65–99)
GLUCOSE-CAPILLARY: 290 mg/dL — AB (ref 65–99)
Glucose-Capillary: 227 mg/dL — ABNORMAL HIGH (ref 65–99)

## 2015-08-29 DIAGNOSIS — E119 Type 2 diabetes mellitus without complications: Secondary | ICD-10-CM | POA: Diagnosis not present

## 2015-08-29 LAB — GLUCOSE, CAPILLARY
GLUCOSE-CAPILLARY: 278 mg/dL — AB (ref 65–99)
Glucose-Capillary: 177 mg/dL — ABNORMAL HIGH (ref 65–99)
Glucose-Capillary: 156 mg/dL — ABNORMAL HIGH (ref 65–99)
Glucose-Capillary: 234 mg/dL — ABNORMAL HIGH (ref 65–99)

## 2015-08-30 DIAGNOSIS — E119 Type 2 diabetes mellitus without complications: Secondary | ICD-10-CM | POA: Diagnosis not present

## 2015-08-30 LAB — GLUCOSE, CAPILLARY
GLUCOSE-CAPILLARY: 203 mg/dL — AB (ref 65–99)
Glucose-Capillary: 308 mg/dL — ABNORMAL HIGH (ref 65–99)
Glucose-Capillary: 277 mg/dL — ABNORMAL HIGH (ref 65–99)
Glucose-Capillary: 167 mg/dL — ABNORMAL HIGH (ref 65–99)

## 2015-08-31 DIAGNOSIS — E119 Type 2 diabetes mellitus without complications: Secondary | ICD-10-CM | POA: Diagnosis not present

## 2015-08-31 LAB — GLUCOSE, CAPILLARY
GLUCOSE-CAPILLARY: 204 mg/dL — AB (ref 65–99)
GLUCOSE-CAPILLARY: 180 mg/dL — AB (ref 65–99)

## 2015-09-01 LAB — GLUCOSE, CAPILLARY
GLUCOSE-CAPILLARY: 237 mg/dL — AB (ref 65–99)
GLUCOSE-CAPILLARY: 298 mg/dL — AB (ref 65–99)
GLUCOSE-CAPILLARY: 162 mg/dL — AB (ref 65–99)
Glucose-Capillary: 185 mg/dL — ABNORMAL HIGH (ref 65–99)
Glucose-Capillary: 79 mg/dL (ref 65–99)
Glucose-Capillary: 203 mg/dL — ABNORMAL HIGH (ref 65–99)
Glucose-Capillary: 259 mg/dL — ABNORMAL HIGH (ref 65–99)

## 2015-09-02 DIAGNOSIS — E119 Type 2 diabetes mellitus without complications: Secondary | ICD-10-CM | POA: Diagnosis not present

## 2015-09-02 LAB — GLUCOSE, CAPILLARY
GLUCOSE-CAPILLARY: 161 mg/dL — AB (ref 65–99)
GLUCOSE-CAPILLARY: 238 mg/dL — AB (ref 65–99)
Glucose-Capillary: 99 mg/dL (ref 65–99)
Glucose-Capillary: 278 mg/dL — ABNORMAL HIGH (ref 65–99)

## 2015-09-03 DIAGNOSIS — E119 Type 2 diabetes mellitus without complications: Secondary | ICD-10-CM | POA: Diagnosis not present

## 2015-09-03 LAB — GLUCOSE, CAPILLARY
GLUCOSE-CAPILLARY: 148 mg/dL — AB (ref 65–99)
GLUCOSE-CAPILLARY: 156 mg/dL — AB (ref 65–99)
Glucose-Capillary: 118 mg/dL — ABNORMAL HIGH (ref 65–99)

## 2015-09-04 DIAGNOSIS — E119 Type 2 diabetes mellitus without complications: Secondary | ICD-10-CM | POA: Diagnosis not present

## 2015-09-04 LAB — GLUCOSE, CAPILLARY
Glucose-Capillary: 249 mg/dL — ABNORMAL HIGH (ref 65–99)
Glucose-Capillary: 70 mg/dL (ref 65–99)

## 2015-09-05 LAB — GLUCOSE, CAPILLARY
GLUCOSE-CAPILLARY: 231 mg/dL — AB (ref 65–99)
GLUCOSE-CAPILLARY: 146 mg/dL — AB (ref 65–99)
GLUCOSE-CAPILLARY: 289 mg/dL — AB (ref 65–99)
Glucose-Capillary: 107 mg/dL — ABNORMAL HIGH (ref 65–99)
Glucose-Capillary: 155 mg/dL — ABNORMAL HIGH (ref 65–99)
Glucose-Capillary: 173 mg/dL — ABNORMAL HIGH (ref 65–99)
Glucose-Capillary: 209 mg/dL — ABNORMAL HIGH (ref 65–99)

## 2015-09-06 DIAGNOSIS — E119 Type 2 diabetes mellitus without complications: Secondary | ICD-10-CM | POA: Diagnosis not present

## 2015-09-06 LAB — GLUCOSE, CAPILLARY: GLUCOSE-CAPILLARY: 159 mg/dL — AB (ref 65–99)

## 2015-09-07 DIAGNOSIS — E119 Type 2 diabetes mellitus without complications: Secondary | ICD-10-CM | POA: Diagnosis not present

## 2015-09-07 LAB — GLUCOSE, CAPILLARY
GLUCOSE-CAPILLARY: 187 mg/dL — AB (ref 65–99)
GLUCOSE-CAPILLARY: 202 mg/dL — AB (ref 65–99)
GLUCOSE-CAPILLARY: 214 mg/dL — AB (ref 65–99)
Glucose-Capillary: 147 mg/dL — ABNORMAL HIGH (ref 65–99)
Glucose-Capillary: 243 mg/dL — ABNORMAL HIGH (ref 65–99)
Glucose-Capillary: 156 mg/dL — ABNORMAL HIGH (ref 65–99)
Glucose-Capillary: 185 mg/dL — ABNORMAL HIGH (ref 65–99)

## 2015-09-08 DIAGNOSIS — E119 Type 2 diabetes mellitus without complications: Secondary | ICD-10-CM | POA: Diagnosis not present

## 2015-09-08 LAB — GLUCOSE, CAPILLARY
GLUCOSE-CAPILLARY: 245 mg/dL — AB (ref 65–99)
Glucose-Capillary: 90 mg/dL (ref 65–99)
Glucose-Capillary: 106 mg/dL — ABNORMAL HIGH (ref 65–99)
Glucose-Capillary: 200 mg/dL — ABNORMAL HIGH (ref 65–99)

## 2015-09-09 ENCOUNTER — Encounter
Admission: RE | Admit: 2015-09-09 | Discharge: 2015-09-09 | Disposition: A | Discharge status: Home / Self Care | Payer: Medicaid Other | Source: Ambulatory Visit | Attending: Internal Medicine | Admitting: Internal Medicine

## 2015-09-09 DIAGNOSIS — E1165 Type 2 diabetes mellitus with hyperglycemia: Secondary | ICD-10-CM | POA: Insufficient documentation

## 2015-09-09 DIAGNOSIS — Z794 Long term (current) use of insulin: Secondary | ICD-10-CM | POA: Insufficient documentation

## 2015-09-11 DIAGNOSIS — Z794 Long term (current) use of insulin: Secondary | ICD-10-CM | POA: Diagnosis not present

## 2015-09-11 DIAGNOSIS — E1165 Type 2 diabetes mellitus with hyperglycemia: Secondary | ICD-10-CM | POA: Diagnosis not present

## 2015-09-11 LAB — GLUCOSE, CAPILLARY
Glucose-Capillary: 159 mg/dL — ABNORMAL HIGH (ref 65–99)
Glucose-Capillary: 168 mg/dL — ABNORMAL HIGH (ref 65–99)
Glucose-Capillary: 190 mg/dL — ABNORMAL HIGH (ref 65–99)
Glucose-Capillary: 185 mg/dL — ABNORMAL HIGH (ref 65–99)
Glucose-Capillary: 175 mg/dL — ABNORMAL HIGH (ref 65–99)

## 2015-09-12 LAB — GLUCOSE, CAPILLARY
GLUCOSE-CAPILLARY: 145 mg/dL — AB (ref 65–99)
GLUCOSE-CAPILLARY: 190 mg/dL — AB (ref 65–99)
Glucose-Capillary: 252 mg/dL — ABNORMAL HIGH (ref 65–99)
Glucose-Capillary: 126 mg/dL — ABNORMAL HIGH (ref 65–99)
Glucose-Capillary: 223 mg/dL — ABNORMAL HIGH (ref 65–99)

## 2015-09-13 DIAGNOSIS — E1165 Type 2 diabetes mellitus with hyperglycemia: Secondary | ICD-10-CM | POA: Diagnosis not present

## 2015-09-13 LAB — GLUCOSE, CAPILLARY
GLUCOSE-CAPILLARY: 82 mg/dL (ref 65–99)
GLUCOSE-CAPILLARY: 204 mg/dL — AB (ref 65–99)
Glucose-Capillary: 157 mg/dL — ABNORMAL HIGH (ref 65–99)

## 2015-09-14 DIAGNOSIS — E1165 Type 2 diabetes mellitus with hyperglycemia: Secondary | ICD-10-CM | POA: Diagnosis not present

## 2015-09-14 LAB — GLUCOSE, CAPILLARY
GLUCOSE-CAPILLARY: 84 mg/dL (ref 65–99)
Glucose-Capillary: 181 mg/dL — ABNORMAL HIGH (ref 65–99)
Glucose-Capillary: 160 mg/dL — ABNORMAL HIGH (ref 65–99)
Glucose-Capillary: 173 mg/dL — ABNORMAL HIGH (ref 65–99)

## 2015-09-15 DIAGNOSIS — E1165 Type 2 diabetes mellitus with hyperglycemia: Secondary | ICD-10-CM | POA: Diagnosis not present

## 2015-09-15 LAB — GLUCOSE, CAPILLARY
GLUCOSE-CAPILLARY: 173 mg/dL — AB (ref 65–99)
Glucose-Capillary: 167 mg/dL — ABNORMAL HIGH (ref 65–99)

## 2015-09-16 LAB — GLUCOSE, CAPILLARY
GLUCOSE-CAPILLARY: 197 mg/dL — AB (ref 65–99)
GLUCOSE-CAPILLARY: 169 mg/dL — AB (ref 65–99)
GLUCOSE-CAPILLARY: 241 mg/dL — AB (ref 65–99)
Glucose-Capillary: 125 mg/dL — ABNORMAL HIGH (ref 65–99)
Glucose-Capillary: 152 mg/dL — ABNORMAL HIGH (ref 65–99)
Glucose-Capillary: 106 mg/dL — ABNORMAL HIGH (ref 65–99)
Glucose-Capillary: 130 mg/dL — ABNORMAL HIGH (ref 65–99)

## 2015-09-17 DIAGNOSIS — E1165 Type 2 diabetes mellitus with hyperglycemia: Secondary | ICD-10-CM | POA: Diagnosis not present

## 2015-09-17 LAB — GLUCOSE, CAPILLARY
GLUCOSE-CAPILLARY: 88 mg/dL (ref 65–99)
GLUCOSE-CAPILLARY: 171 mg/dL — AB (ref 65–99)
GLUCOSE-CAPILLARY: 160 mg/dL — AB (ref 65–99)
Glucose-Capillary: 99 mg/dL (ref 65–99)

## 2015-09-18 DIAGNOSIS — E1165 Type 2 diabetes mellitus with hyperglycemia: Secondary | ICD-10-CM | POA: Diagnosis not present

## 2015-09-18 LAB — GLUCOSE, CAPILLARY: GLUCOSE-CAPILLARY: 121 mg/dL — AB (ref 65–99)

## 2015-09-19 DIAGNOSIS — E1165 Type 2 diabetes mellitus with hyperglycemia: Secondary | ICD-10-CM | POA: Diagnosis not present

## 2015-09-19 LAB — GLUCOSE, CAPILLARY
GLUCOSE-CAPILLARY: 123 mg/dL — AB (ref 65–99)
GLUCOSE-CAPILLARY: 187 mg/dL — AB (ref 65–99)
Glucose-Capillary: 225 mg/dL — ABNORMAL HIGH (ref 65–99)

## 2015-09-20 LAB — GLUCOSE, CAPILLARY
GLUCOSE-CAPILLARY: 204 mg/dL — AB (ref 65–99)
GLUCOSE-CAPILLARY: 189 mg/dL — AB (ref 65–99)
GLUCOSE-CAPILLARY: 255 mg/dL — AB (ref 65–99)
GLUCOSE-CAPILLARY: 145 mg/dL — AB (ref 65–99)
Glucose-Capillary: 230 mg/dL — ABNORMAL HIGH (ref 65–99)

## 2015-09-21 DIAGNOSIS — E1165 Type 2 diabetes mellitus with hyperglycemia: Secondary | ICD-10-CM | POA: Diagnosis not present

## 2015-09-21 LAB — GLUCOSE, CAPILLARY
GLUCOSE-CAPILLARY: 145 mg/dL — AB (ref 65–99)
GLUCOSE-CAPILLARY: 222 mg/dL — AB (ref 65–99)
GLUCOSE-CAPILLARY: 199 mg/dL — AB (ref 65–99)
Glucose-Capillary: 159 mg/dL — ABNORMAL HIGH (ref 65–99)
Glucose-Capillary: 133 mg/dL — ABNORMAL HIGH (ref 65–99)
Glucose-Capillary: 181 mg/dL — ABNORMAL HIGH (ref 65–99)
Glucose-Capillary: 176 mg/dL — ABNORMAL HIGH (ref 65–99)

## 2015-09-22 DIAGNOSIS — E1165 Type 2 diabetes mellitus with hyperglycemia: Secondary | ICD-10-CM | POA: Diagnosis not present

## 2015-09-22 LAB — GLUCOSE, CAPILLARY
GLUCOSE-CAPILLARY: 115 mg/dL — AB (ref 65–99)
GLUCOSE-CAPILLARY: 205 mg/dL — AB (ref 65–99)
Glucose-Capillary: 121 mg/dL — ABNORMAL HIGH (ref 65–99)
Glucose-Capillary: 138 mg/dL — ABNORMAL HIGH (ref 65–99)

## 2015-09-23 DIAGNOSIS — E1165 Type 2 diabetes mellitus with hyperglycemia: Secondary | ICD-10-CM | POA: Diagnosis not present

## 2015-09-23 LAB — GLUCOSE, CAPILLARY
GLUCOSE-CAPILLARY: 114 mg/dL — AB (ref 65–99)
GLUCOSE-CAPILLARY: 120 mg/dL — AB (ref 65–99)
GLUCOSE-CAPILLARY: 182 mg/dL — AB (ref 65–99)
GLUCOSE-CAPILLARY: 148 mg/dL — AB (ref 65–99)

## 2015-09-24 DIAGNOSIS — E1165 Type 2 diabetes mellitus with hyperglycemia: Secondary | ICD-10-CM | POA: Diagnosis not present

## 2015-09-24 LAB — GLUCOSE, CAPILLARY: Glucose-Capillary: 207 mg/dL — ABNORMAL HIGH (ref 65–99)

## 2015-09-25 DIAGNOSIS — E1165 Type 2 diabetes mellitus with hyperglycemia: Secondary | ICD-10-CM | POA: Diagnosis not present

## 2015-09-25 LAB — GLUCOSE, CAPILLARY
GLUCOSE-CAPILLARY: 186 mg/dL — AB (ref 65–99)
GLUCOSE-CAPILLARY: 195 mg/dL — AB (ref 65–99)
Glucose-Capillary: 179 mg/dL — ABNORMAL HIGH (ref 65–99)
Glucose-Capillary: 161 mg/dL — ABNORMAL HIGH (ref 65–99)
Glucose-Capillary: 208 mg/dL — ABNORMAL HIGH (ref 65–99)

## 2015-09-26 DIAGNOSIS — E1165 Type 2 diabetes mellitus with hyperglycemia: Secondary | ICD-10-CM | POA: Diagnosis not present

## 2015-09-26 LAB — GLUCOSE, CAPILLARY
GLUCOSE-CAPILLARY: 102 mg/dL — AB (ref 65–99)
Glucose-Capillary: 218 mg/dL — ABNORMAL HIGH (ref 65–99)

## 2015-09-27 DIAGNOSIS — E1165 Type 2 diabetes mellitus with hyperglycemia: Secondary | ICD-10-CM | POA: Diagnosis not present

## 2015-09-27 LAB — GLUCOSE, CAPILLARY: GLUCOSE-CAPILLARY: 108 mg/dL — AB (ref 65–99)

## 2015-09-28 DIAGNOSIS — E1165 Type 2 diabetes mellitus with hyperglycemia: Secondary | ICD-10-CM | POA: Diagnosis not present

## 2015-09-28 LAB — GLUCOSE, CAPILLARY
GLUCOSE-CAPILLARY: 186 mg/dL — AB (ref 65–99)
Glucose-Capillary: 123 mg/dL — ABNORMAL HIGH (ref 65–99)

## 2015-09-29 DIAGNOSIS — E1165 Type 2 diabetes mellitus with hyperglycemia: Secondary | ICD-10-CM | POA: Diagnosis not present

## 2015-09-29 LAB — GLUCOSE, CAPILLARY
GLUCOSE-CAPILLARY: 342 mg/dL — AB (ref 65–99)
GLUCOSE-CAPILLARY: 223 mg/dL — AB (ref 65–99)
Glucose-Capillary: 162 mg/dL — ABNORMAL HIGH (ref 65–99)

## 2015-09-30 LAB — GLUCOSE, CAPILLARY
GLUCOSE-CAPILLARY: 259 mg/dL — AB (ref 65–99)
Glucose-Capillary: 309 mg/dL — ABNORMAL HIGH (ref 65–99)
Glucose-Capillary: 164 mg/dL — ABNORMAL HIGH (ref 65–99)

## 2015-10-01 DIAGNOSIS — E1165 Type 2 diabetes mellitus with hyperglycemia: Secondary | ICD-10-CM | POA: Diagnosis not present

## 2015-10-01 LAB — GLUCOSE, CAPILLARY
GLUCOSE-CAPILLARY: 233 mg/dL — AB (ref 65–99)
Glucose-Capillary: 224 mg/dL — ABNORMAL HIGH (ref 65–99)

## 2015-10-02 DIAGNOSIS — E1165 Type 2 diabetes mellitus with hyperglycemia: Secondary | ICD-10-CM | POA: Diagnosis not present

## 2015-10-02 LAB — GLUCOSE, CAPILLARY: GLUCOSE-CAPILLARY: 82 mg/dL (ref 65–99)

## 2015-10-03 DIAGNOSIS — E1165 Type 2 diabetes mellitus with hyperglycemia: Secondary | ICD-10-CM | POA: Diagnosis not present

## 2015-10-03 LAB — GLUCOSE, CAPILLARY
GLUCOSE-CAPILLARY: 185 mg/dL — AB (ref 65–99)
Glucose-Capillary: 193 mg/dL — ABNORMAL HIGH (ref 65–99)

## 2015-10-04 LAB — GLUCOSE, CAPILLARY
GLUCOSE-CAPILLARY: 178 mg/dL — AB (ref 65–99)
GLUCOSE-CAPILLARY: 126 mg/dL — AB (ref 65–99)

## 2015-10-05 DIAGNOSIS — E1165 Type 2 diabetes mellitus with hyperglycemia: Secondary | ICD-10-CM | POA: Diagnosis not present

## 2015-10-05 LAB — GLUCOSE, CAPILLARY
GLUCOSE-CAPILLARY: 186 mg/dL — AB (ref 65–99)
Glucose-Capillary: 123 mg/dL — ABNORMAL HIGH (ref 65–99)
Glucose-Capillary: 184 mg/dL — ABNORMAL HIGH (ref 65–99)

## 2015-10-06 DIAGNOSIS — E1165 Type 2 diabetes mellitus with hyperglycemia: Secondary | ICD-10-CM | POA: Diagnosis not present

## 2015-10-06 LAB — GLUCOSE, CAPILLARY
GLUCOSE-CAPILLARY: 90 mg/dL (ref 65–99)
GLUCOSE-CAPILLARY: 181 mg/dL — AB (ref 65–99)

## 2015-10-07 ENCOUNTER — Encounter
Admission: RE | Admit: 2015-10-07 | Discharge: 2015-10-07 | Disposition: A | Discharge status: Home / Self Care | Payer: Medicaid Other | Source: Ambulatory Visit | Attending: Internal Medicine | Admitting: Internal Medicine

## 2015-10-07 DIAGNOSIS — E1165 Type 2 diabetes mellitus with hyperglycemia: Secondary | ICD-10-CM | POA: Diagnosis not present

## 2015-10-07 LAB — GLUCOSE, CAPILLARY
GLUCOSE-CAPILLARY: 61 mg/dL — AB (ref 65–99)
Glucose-Capillary: 135 mg/dL — ABNORMAL HIGH (ref 65–99)

## 2015-10-08 LAB — GLUCOSE, CAPILLARY: Glucose-Capillary: 276 mg/dL — ABNORMAL HIGH (ref 65–99)

## 2015-10-09 DIAGNOSIS — E1165 Type 2 diabetes mellitus with hyperglycemia: Secondary | ICD-10-CM | POA: Diagnosis not present

## 2015-10-09 LAB — GLUCOSE, CAPILLARY
GLUCOSE-CAPILLARY: 115 mg/dL — AB (ref 65–99)
GLUCOSE-CAPILLARY: 184 mg/dL — AB (ref 65–99)
GLUCOSE-CAPILLARY: 114 mg/dL — AB (ref 65–99)
Glucose-Capillary: 220 mg/dL — ABNORMAL HIGH (ref 65–99)

## 2015-10-14 DIAGNOSIS — E1165 Type 2 diabetes mellitus with hyperglycemia: Secondary | ICD-10-CM | POA: Diagnosis not present

## 2015-10-14 LAB — URINALYSIS COMPLETE WITH MICROSCOPIC (ARMC ONLY)
BILIRUBIN URINE: NEGATIVE
Bacteria, UA: NONE SEEN
GLUCOSE, UA: 50 mg/dL — AB
KETONES UR: NEGATIVE mg/dL
LEUKOCYTES UA: NEGATIVE
Nitrite: NEGATIVE
PH: 5 (ref 5.0–8.0)
Protein, ur: NEGATIVE mg/dL
Specific Gravity, Urine: 1.01 (ref 1.005–1.030)

## 2015-10-30 DIAGNOSIS — E1165 Type 2 diabetes mellitus with hyperglycemia: Secondary | ICD-10-CM | POA: Diagnosis not present

## 2015-10-31 DIAGNOSIS — E1165 Type 2 diabetes mellitus with hyperglycemia: Secondary | ICD-10-CM | POA: Diagnosis not present

## 2015-10-31 LAB — GLUCOSE, CAPILLARY
GLUCOSE-CAPILLARY: 163 mg/dL — AB (ref 65–99)
Glucose-Capillary: 103 mg/dL — ABNORMAL HIGH (ref 65–99)

## 2015-11-01 DIAGNOSIS — E1165 Type 2 diabetes mellitus with hyperglycemia: Secondary | ICD-10-CM | POA: Diagnosis not present

## 2015-11-01 LAB — GLUCOSE, CAPILLARY: GLUCOSE-CAPILLARY: 173 mg/dL — AB (ref 65–99)

## 2015-11-02 DIAGNOSIS — E1165 Type 2 diabetes mellitus with hyperglycemia: Secondary | ICD-10-CM | POA: Diagnosis not present

## 2015-11-02 LAB — GLUCOSE, CAPILLARY
GLUCOSE-CAPILLARY: 260 mg/dL — AB (ref 65–99)
GLUCOSE-CAPILLARY: 197 mg/dL — AB (ref 65–99)
GLUCOSE-CAPILLARY: 179 mg/dL — AB (ref 65–99)
GLUCOSE-CAPILLARY: 113 mg/dL — AB (ref 65–99)
GLUCOSE-CAPILLARY: 160 mg/dL — AB (ref 65–99)
GLUCOSE-CAPILLARY: 139 mg/dL — AB (ref 65–99)
Glucose-Capillary: 192 mg/dL — ABNORMAL HIGH (ref 65–99)
Glucose-Capillary: 120 mg/dL — ABNORMAL HIGH (ref 65–99)
Glucose-Capillary: 327 mg/dL — ABNORMAL HIGH (ref 65–99)

## 2015-11-03 LAB — GLUCOSE, CAPILLARY
GLUCOSE-CAPILLARY: 183 mg/dL — AB (ref 65–99)
GLUCOSE-CAPILLARY: 252 mg/dL — AB (ref 65–99)
Glucose-Capillary: 369 mg/dL — ABNORMAL HIGH (ref 65–99)

## 2015-11-04 DIAGNOSIS — E1165 Type 2 diabetes mellitus with hyperglycemia: Secondary | ICD-10-CM | POA: Diagnosis not present

## 2015-11-04 LAB — GLUCOSE, CAPILLARY
GLUCOSE-CAPILLARY: 303 mg/dL — AB (ref 65–99)
Glucose-Capillary: 100 mg/dL — ABNORMAL HIGH (ref 65–99)

## 2015-11-05 DIAGNOSIS — E1165 Type 2 diabetes mellitus with hyperglycemia: Secondary | ICD-10-CM | POA: Diagnosis not present

## 2015-11-05 LAB — GLUCOSE, CAPILLARY
Glucose-Capillary: 148 mg/dL — ABNORMAL HIGH (ref 65–99)
Glucose-Capillary: 158 mg/dL — ABNORMAL HIGH (ref 65–99)

## 2015-11-06 DIAGNOSIS — E1165 Type 2 diabetes mellitus with hyperglycemia: Secondary | ICD-10-CM | POA: Diagnosis not present

## 2015-11-06 LAB — GLUCOSE, CAPILLARY
GLUCOSE-CAPILLARY: 185 mg/dL — AB (ref 65–99)
Glucose-Capillary: 74 mg/dL (ref 65–99)

## 2015-11-07 ENCOUNTER — Encounter
Admission: RE | Admit: 2015-11-07 | Discharge: 2015-11-07 | Disposition: A | Discharge status: Home / Self Care | Payer: Medicaid Other | Source: Ambulatory Visit | Attending: Internal Medicine | Admitting: Internal Medicine

## 2015-11-07 DIAGNOSIS — E1165 Type 2 diabetes mellitus with hyperglycemia: Secondary | ICD-10-CM | POA: Insufficient documentation

## 2015-11-07 DIAGNOSIS — Z794 Long term (current) use of insulin: Secondary | ICD-10-CM | POA: Insufficient documentation

## 2015-11-07 LAB — GLUCOSE, CAPILLARY: Glucose-Capillary: 198 mg/dL — ABNORMAL HIGH (ref 65–99)

## 2015-11-08 DIAGNOSIS — E1165 Type 2 diabetes mellitus with hyperglycemia: Secondary | ICD-10-CM | POA: Diagnosis not present

## 2015-11-08 LAB — GLUCOSE, CAPILLARY
GLUCOSE-CAPILLARY: 104 mg/dL — AB (ref 65–99)
Glucose-Capillary: 235 mg/dL — ABNORMAL HIGH (ref 65–99)

## 2015-11-09 DIAGNOSIS — E1165 Type 2 diabetes mellitus with hyperglycemia: Secondary | ICD-10-CM | POA: Diagnosis not present

## 2015-11-09 LAB — GLUCOSE, CAPILLARY: Glucose-Capillary: 167 mg/dL — ABNORMAL HIGH (ref 65–99)

## 2015-11-10 DIAGNOSIS — E1165 Type 2 diabetes mellitus with hyperglycemia: Secondary | ICD-10-CM | POA: Diagnosis not present

## 2015-11-10 LAB — GLUCOSE, CAPILLARY
Glucose-Capillary: 192 mg/dL — ABNORMAL HIGH (ref 65–99)
Glucose-Capillary: 125 mg/dL — ABNORMAL HIGH (ref 65–99)

## 2015-11-11 LAB — GLUCOSE, CAPILLARY
GLUCOSE-CAPILLARY: 97 mg/dL (ref 65–99)
GLUCOSE-CAPILLARY: 236 mg/dL — AB (ref 65–99)
Glucose-Capillary: 124 mg/dL — ABNORMAL HIGH (ref 65–99)

## 2015-11-12 DIAGNOSIS — E1165 Type 2 diabetes mellitus with hyperglycemia: Secondary | ICD-10-CM | POA: Diagnosis not present

## 2015-11-12 LAB — GLUCOSE, CAPILLARY
GLUCOSE-CAPILLARY: 164 mg/dL — AB (ref 65–99)
Glucose-Capillary: 85 mg/dL (ref 65–99)

## 2015-11-13 DIAGNOSIS — E1165 Type 2 diabetes mellitus with hyperglycemia: Secondary | ICD-10-CM | POA: Diagnosis not present

## 2015-11-13 LAB — GLUCOSE, CAPILLARY
GLUCOSE-CAPILLARY: 91 mg/dL (ref 65–99)
GLUCOSE-CAPILLARY: 218 mg/dL — AB (ref 65–99)

## 2015-11-14 DIAGNOSIS — E1165 Type 2 diabetes mellitus with hyperglycemia: Secondary | ICD-10-CM | POA: Diagnosis not present

## 2015-11-14 LAB — GLUCOSE, CAPILLARY: GLUCOSE-CAPILLARY: 230 mg/dL — AB (ref 65–99)

## 2015-11-15 LAB — GLUCOSE, CAPILLARY
GLUCOSE-CAPILLARY: 199 mg/dL — AB (ref 65–99)
Glucose-Capillary: 160 mg/dL — ABNORMAL HIGH (ref 65–99)

## 2015-11-16 DIAGNOSIS — E1165 Type 2 diabetes mellitus with hyperglycemia: Secondary | ICD-10-CM | POA: Diagnosis not present

## 2015-11-16 LAB — GLUCOSE, CAPILLARY
GLUCOSE-CAPILLARY: 251 mg/dL — AB (ref 65–99)
Glucose-Capillary: 100 mg/dL — ABNORMAL HIGH (ref 65–99)
Glucose-Capillary: 194 mg/dL — ABNORMAL HIGH (ref 65–99)

## 2015-11-17 DIAGNOSIS — E1165 Type 2 diabetes mellitus with hyperglycemia: Secondary | ICD-10-CM | POA: Diagnosis not present

## 2015-11-17 LAB — GLUCOSE, CAPILLARY
GLUCOSE-CAPILLARY: 72 mg/dL (ref 65–99)
GLUCOSE-CAPILLARY: 166 mg/dL — AB (ref 65–99)

## 2015-11-18 DIAGNOSIS — E1165 Type 2 diabetes mellitus with hyperglycemia: Secondary | ICD-10-CM | POA: Diagnosis not present

## 2015-11-18 LAB — GLUCOSE, CAPILLARY
GLUCOSE-CAPILLARY: 218 mg/dL — AB (ref 65–99)
Glucose-Capillary: 118 mg/dL — ABNORMAL HIGH (ref 65–99)

## 2015-11-19 DIAGNOSIS — E1165 Type 2 diabetes mellitus with hyperglycemia: Secondary | ICD-10-CM | POA: Diagnosis not present

## 2015-11-19 LAB — GLUCOSE, CAPILLARY
GLUCOSE-CAPILLARY: 92 mg/dL (ref 65–99)
GLUCOSE-CAPILLARY: 180 mg/dL — AB (ref 65–99)

## 2015-11-20 DIAGNOSIS — E1165 Type 2 diabetes mellitus with hyperglycemia: Secondary | ICD-10-CM | POA: Diagnosis not present

## 2015-11-20 LAB — GLUCOSE, CAPILLARY
GLUCOSE-CAPILLARY: 169 mg/dL — AB (ref 65–99)
Glucose-Capillary: 187 mg/dL — ABNORMAL HIGH (ref 65–99)

## 2015-11-21 DIAGNOSIS — E1165 Type 2 diabetes mellitus with hyperglycemia: Secondary | ICD-10-CM | POA: Diagnosis not present

## 2015-11-21 LAB — GLUCOSE, CAPILLARY
GLUCOSE-CAPILLARY: 239 mg/dL — AB (ref 65–99)
Glucose-Capillary: 158 mg/dL — ABNORMAL HIGH (ref 65–99)

## 2015-11-22 DIAGNOSIS — E1165 Type 2 diabetes mellitus with hyperglycemia: Secondary | ICD-10-CM | POA: Diagnosis not present

## 2015-11-22 LAB — GLUCOSE, CAPILLARY
GLUCOSE-CAPILLARY: 204 mg/dL — AB (ref 65–99)
Glucose-Capillary: 166 mg/dL — ABNORMAL HIGH (ref 65–99)

## 2015-11-23 DIAGNOSIS — E1165 Type 2 diabetes mellitus with hyperglycemia: Secondary | ICD-10-CM | POA: Diagnosis not present

## 2015-11-23 LAB — GLUCOSE, CAPILLARY
GLUCOSE-CAPILLARY: 178 mg/dL — AB (ref 65–99)
Glucose-Capillary: 209 mg/dL — ABNORMAL HIGH (ref 65–99)

## 2015-11-24 DIAGNOSIS — E1165 Type 2 diabetes mellitus with hyperglycemia: Secondary | ICD-10-CM | POA: Diagnosis not present

## 2015-11-24 LAB — GLUCOSE, CAPILLARY
Glucose-Capillary: 147 mg/dL — ABNORMAL HIGH (ref 65–99)
Glucose-Capillary: 199 mg/dL — ABNORMAL HIGH (ref 65–99)

## 2015-11-25 DIAGNOSIS — E1165 Type 2 diabetes mellitus with hyperglycemia: Secondary | ICD-10-CM | POA: Diagnosis not present

## 2015-11-25 LAB — GLUCOSE, CAPILLARY
Glucose-Capillary: 116 mg/dL — ABNORMAL HIGH (ref 65–99)
Glucose-Capillary: 192 mg/dL — ABNORMAL HIGH (ref 65–99)

## 2015-11-26 DIAGNOSIS — E1165 Type 2 diabetes mellitus with hyperglycemia: Secondary | ICD-10-CM | POA: Diagnosis not present

## 2015-11-26 LAB — GLUCOSE, CAPILLARY
GLUCOSE-CAPILLARY: 184 mg/dL — AB (ref 65–99)
Glucose-Capillary: 207 mg/dL — ABNORMAL HIGH (ref 65–99)

## 2015-11-27 DIAGNOSIS — E1165 Type 2 diabetes mellitus with hyperglycemia: Secondary | ICD-10-CM | POA: Diagnosis not present

## 2015-11-27 LAB — GLUCOSE, CAPILLARY
GLUCOSE-CAPILLARY: 149 mg/dL — AB (ref 65–99)
Glucose-Capillary: 228 mg/dL — ABNORMAL HIGH (ref 65–99)

## 2015-11-28 DIAGNOSIS — E1165 Type 2 diabetes mellitus with hyperglycemia: Secondary | ICD-10-CM | POA: Diagnosis not present

## 2015-11-28 LAB — GLUCOSE, CAPILLARY
GLUCOSE-CAPILLARY: 132 mg/dL — AB (ref 65–99)
GLUCOSE-CAPILLARY: 237 mg/dL — AB (ref 65–99)

## 2015-11-29 DIAGNOSIS — E1165 Type 2 diabetes mellitus with hyperglycemia: Secondary | ICD-10-CM | POA: Diagnosis not present

## 2015-11-29 LAB — GLUCOSE, CAPILLARY
GLUCOSE-CAPILLARY: 155 mg/dL — AB (ref 65–99)
GLUCOSE-CAPILLARY: 230 mg/dL — AB (ref 65–99)

## 2015-11-30 DIAGNOSIS — E1165 Type 2 diabetes mellitus with hyperglycemia: Secondary | ICD-10-CM | POA: Diagnosis not present

## 2015-11-30 LAB — GLUCOSE, CAPILLARY
Glucose-Capillary: 185 mg/dL — ABNORMAL HIGH (ref 65–99)
Glucose-Capillary: 249 mg/dL — ABNORMAL HIGH (ref 65–99)

## 2015-12-01 DIAGNOSIS — E1165 Type 2 diabetes mellitus with hyperglycemia: Secondary | ICD-10-CM | POA: Diagnosis not present

## 2015-12-01 LAB — GLUCOSE, CAPILLARY
GLUCOSE-CAPILLARY: 174 mg/dL — AB (ref 65–99)
Glucose-Capillary: 231 mg/dL — ABNORMAL HIGH (ref 65–99)

## 2015-12-02 DIAGNOSIS — E1165 Type 2 diabetes mellitus with hyperglycemia: Secondary | ICD-10-CM | POA: Diagnosis not present

## 2015-12-02 LAB — GLUCOSE, CAPILLARY
GLUCOSE-CAPILLARY: 191 mg/dL — AB (ref 65–99)
GLUCOSE-CAPILLARY: 206 mg/dL — AB (ref 65–99)
Glucose-Capillary: 239 mg/dL — ABNORMAL HIGH (ref 65–99)

## 2015-12-03 DIAGNOSIS — E1165 Type 2 diabetes mellitus with hyperglycemia: Secondary | ICD-10-CM | POA: Diagnosis not present

## 2015-12-03 LAB — GLUCOSE, CAPILLARY
Glucose-Capillary: 121 mg/dL — ABNORMAL HIGH (ref 65–99)
Glucose-Capillary: 195 mg/dL — ABNORMAL HIGH (ref 65–99)

## 2015-12-04 DIAGNOSIS — E1165 Type 2 diabetes mellitus with hyperglycemia: Secondary | ICD-10-CM | POA: Diagnosis not present

## 2015-12-04 LAB — GLUCOSE, CAPILLARY
GLUCOSE-CAPILLARY: 165 mg/dL — AB (ref 65–99)
GLUCOSE-CAPILLARY: 230 mg/dL — AB (ref 65–99)

## 2015-12-05 DIAGNOSIS — E1165 Type 2 diabetes mellitus with hyperglycemia: Secondary | ICD-10-CM | POA: Diagnosis not present

## 2015-12-05 LAB — GLUCOSE, CAPILLARY
GLUCOSE-CAPILLARY: 244 mg/dL — AB (ref 65–99)
Glucose-Capillary: 134 mg/dL — ABNORMAL HIGH (ref 65–99)

## 2015-12-06 DIAGNOSIS — E1165 Type 2 diabetes mellitus with hyperglycemia: Secondary | ICD-10-CM | POA: Diagnosis not present

## 2015-12-06 LAB — GLUCOSE, CAPILLARY
Glucose-Capillary: 178 mg/dL — ABNORMAL HIGH (ref 65–99)
Glucose-Capillary: 284 mg/dL — ABNORMAL HIGH (ref 65–99)

## 2015-12-07 ENCOUNTER — Encounter
Admission: RE | Admit: 2015-12-07 | Discharge: 2015-12-07 | Disposition: A | Discharge status: Home / Self Care | Payer: Medicaid Other | Source: Ambulatory Visit | Attending: Internal Medicine | Admitting: Internal Medicine

## 2015-12-07 DIAGNOSIS — E1165 Type 2 diabetes mellitus with hyperglycemia: Secondary | ICD-10-CM | POA: Insufficient documentation

## 2015-12-07 LAB — GLUCOSE, CAPILLARY: GLUCOSE-CAPILLARY: 296 mg/dL — AB (ref 65–99)

## 2015-12-08 LAB — GLUCOSE, CAPILLARY: Glucose-Capillary: 171 mg/dL — ABNORMAL HIGH (ref 65–99)

## 2015-12-18 DIAGNOSIS — E1165 Type 2 diabetes mellitus with hyperglycemia: Secondary | ICD-10-CM | POA: Diagnosis present

## 2015-12-18 LAB — GLUCOSE, CAPILLARY
GLUCOSE-CAPILLARY: 246 mg/dL — AB (ref 65–99)
GLUCOSE-CAPILLARY: 184 mg/dL — AB (ref 65–99)
GLUCOSE-CAPILLARY: 370 mg/dL — AB (ref 65–99)
GLUCOSE-CAPILLARY: 177 mg/dL — AB (ref 65–99)
Glucose-Capillary: 247 mg/dL — ABNORMAL HIGH (ref 65–99)
Glucose-Capillary: 201 mg/dL — ABNORMAL HIGH (ref 65–99)
Glucose-Capillary: 220 mg/dL — ABNORMAL HIGH (ref 65–99)
Glucose-Capillary: 297 mg/dL — ABNORMAL HIGH (ref 65–99)

## 2015-12-29 DIAGNOSIS — E1165 Type 2 diabetes mellitus with hyperglycemia: Secondary | ICD-10-CM | POA: Diagnosis not present

## 2015-12-29 LAB — GLUCOSE, CAPILLARY
GLUCOSE-CAPILLARY: 287 mg/dL — AB (ref 65–99)
Glucose-Capillary: 158 mg/dL — ABNORMAL HIGH (ref 65–99)

## 2015-12-30 DIAGNOSIS — E1165 Type 2 diabetes mellitus with hyperglycemia: Secondary | ICD-10-CM | POA: Diagnosis not present

## 2015-12-30 LAB — GLUCOSE, CAPILLARY
GLUCOSE-CAPILLARY: 178 mg/dL — AB (ref 65–99)
GLUCOSE-CAPILLARY: 192 mg/dL — AB (ref 65–99)

## 2015-12-31 DIAGNOSIS — E1165 Type 2 diabetes mellitus with hyperglycemia: Secondary | ICD-10-CM | POA: Diagnosis not present

## 2015-12-31 LAB — GLUCOSE, CAPILLARY
Glucose-Capillary: 288 mg/dL — ABNORMAL HIGH (ref 65–99)
Glucose-Capillary: 244 mg/dL — ABNORMAL HIGH (ref 65–99)

## 2016-01-01 DIAGNOSIS — E1165 Type 2 diabetes mellitus with hyperglycemia: Secondary | ICD-10-CM | POA: Diagnosis not present

## 2016-01-01 LAB — GLUCOSE, CAPILLARY
GLUCOSE-CAPILLARY: 285 mg/dL — AB (ref 65–99)
Glucose-Capillary: 129 mg/dL — ABNORMAL HIGH (ref 65–99)

## 2016-01-02 DIAGNOSIS — E1165 Type 2 diabetes mellitus with hyperglycemia: Secondary | ICD-10-CM | POA: Diagnosis not present

## 2016-01-02 LAB — GLUCOSE, CAPILLARY
GLUCOSE-CAPILLARY: 213 mg/dL — AB (ref 65–99)
Glucose-Capillary: 230 mg/dL — ABNORMAL HIGH (ref 65–99)

## 2016-01-03 DIAGNOSIS — E1165 Type 2 diabetes mellitus with hyperglycemia: Secondary | ICD-10-CM | POA: Diagnosis not present

## 2016-01-03 LAB — GLUCOSE, CAPILLARY
GLUCOSE-CAPILLARY: 299 mg/dL — AB (ref 65–99)
Glucose-Capillary: 247 mg/dL — ABNORMAL HIGH (ref 65–99)

## 2016-01-04 DIAGNOSIS — E1165 Type 2 diabetes mellitus with hyperglycemia: Secondary | ICD-10-CM | POA: Diagnosis not present

## 2016-01-04 LAB — GLUCOSE, CAPILLARY
GLUCOSE-CAPILLARY: 252 mg/dL — AB (ref 65–99)
Glucose-Capillary: 203 mg/dL — ABNORMAL HIGH (ref 65–99)

## 2016-01-05 DIAGNOSIS — E1165 Type 2 diabetes mellitus with hyperglycemia: Secondary | ICD-10-CM | POA: Diagnosis not present

## 2016-01-05 LAB — GLUCOSE, CAPILLARY
GLUCOSE-CAPILLARY: 175 mg/dL — AB (ref 65–99)
Glucose-Capillary: 232 mg/dL — ABNORMAL HIGH (ref 65–99)

## 2016-01-06 DIAGNOSIS — E1165 Type 2 diabetes mellitus with hyperglycemia: Secondary | ICD-10-CM | POA: Diagnosis not present

## 2016-01-06 LAB — GLUCOSE, CAPILLARY
Glucose-Capillary: 196 mg/dL — ABNORMAL HIGH (ref 65–99)
Glucose-Capillary: 212 mg/dL — ABNORMAL HIGH (ref 65–99)

## 2016-01-07 ENCOUNTER — Encounter
Admission: RE | Admit: 2016-01-07 | Discharge: 2016-01-07 | Disposition: A | Discharge status: Home / Self Care | Payer: Medicaid Other | Source: Ambulatory Visit | Attending: Internal Medicine | Admitting: Internal Medicine

## 2016-01-07 DIAGNOSIS — E1165 Type 2 diabetes mellitus with hyperglycemia: Secondary | ICD-10-CM | POA: Insufficient documentation

## 2016-01-07 LAB — GLUCOSE, CAPILLARY: GLUCOSE-CAPILLARY: 226 mg/dL — AB (ref 65–99)

## 2016-01-08 DIAGNOSIS — E1165 Type 2 diabetes mellitus with hyperglycemia: Secondary | ICD-10-CM | POA: Diagnosis not present

## 2016-01-08 LAB — GLUCOSE, CAPILLARY
GLUCOSE-CAPILLARY: 261 mg/dL — AB (ref 65–99)
Glucose-Capillary: 229 mg/dL — ABNORMAL HIGH (ref 65–99)

## 2016-01-09 DIAGNOSIS — E1165 Type 2 diabetes mellitus with hyperglycemia: Secondary | ICD-10-CM | POA: Diagnosis not present

## 2016-01-09 LAB — GLUCOSE, CAPILLARY
GLUCOSE-CAPILLARY: 276 mg/dL — AB (ref 65–99)
Glucose-Capillary: 184 mg/dL — ABNORMAL HIGH (ref 65–99)

## 2016-01-10 DIAGNOSIS — E1165 Type 2 diabetes mellitus with hyperglycemia: Secondary | ICD-10-CM | POA: Diagnosis not present

## 2016-01-10 LAB — GLUCOSE, CAPILLARY
GLUCOSE-CAPILLARY: 166 mg/dL — AB (ref 65–99)
GLUCOSE-CAPILLARY: 229 mg/dL — AB (ref 65–99)

## 2016-01-11 DIAGNOSIS — E1165 Type 2 diabetes mellitus with hyperglycemia: Secondary | ICD-10-CM | POA: Diagnosis not present

## 2016-01-11 LAB — GLUCOSE, CAPILLARY
GLUCOSE-CAPILLARY: 231 mg/dL — AB (ref 65–99)
GLUCOSE-CAPILLARY: 248 mg/dL — AB (ref 65–99)

## 2016-01-12 DIAGNOSIS — E1165 Type 2 diabetes mellitus with hyperglycemia: Secondary | ICD-10-CM | POA: Diagnosis not present

## 2016-01-12 LAB — GLUCOSE, CAPILLARY
GLUCOSE-CAPILLARY: 215 mg/dL — AB (ref 65–99)
Glucose-Capillary: 231 mg/dL — ABNORMAL HIGH (ref 65–99)

## 2016-01-13 DIAGNOSIS — E1165 Type 2 diabetes mellitus with hyperglycemia: Secondary | ICD-10-CM | POA: Diagnosis not present

## 2016-01-13 LAB — GLUCOSE, CAPILLARY
GLUCOSE-CAPILLARY: 191 mg/dL — AB (ref 65–99)
GLUCOSE-CAPILLARY: 160 mg/dL — AB (ref 65–99)

## 2016-01-14 DIAGNOSIS — E1165 Type 2 diabetes mellitus with hyperglycemia: Secondary | ICD-10-CM | POA: Diagnosis not present

## 2016-01-14 LAB — GLUCOSE, CAPILLARY
GLUCOSE-CAPILLARY: 251 mg/dL — AB (ref 65–99)
Glucose-Capillary: 180 mg/dL — ABNORMAL HIGH (ref 65–99)

## 2016-01-15 DIAGNOSIS — E1165 Type 2 diabetes mellitus with hyperglycemia: Secondary | ICD-10-CM | POA: Diagnosis not present

## 2016-01-15 LAB — GLUCOSE, CAPILLARY
Glucose-Capillary: 171 mg/dL — ABNORMAL HIGH (ref 65–99)
Glucose-Capillary: 188 mg/dL — ABNORMAL HIGH (ref 65–99)

## 2016-01-16 DIAGNOSIS — E1165 Type 2 diabetes mellitus with hyperglycemia: Secondary | ICD-10-CM | POA: Diagnosis not present

## 2016-01-16 LAB — GLUCOSE, CAPILLARY
GLUCOSE-CAPILLARY: 191 mg/dL — AB (ref 65–99)
Glucose-Capillary: 220 mg/dL — ABNORMAL HIGH (ref 65–99)

## 2016-01-17 DIAGNOSIS — E1165 Type 2 diabetes mellitus with hyperglycemia: Secondary | ICD-10-CM | POA: Diagnosis not present

## 2016-01-17 LAB — GLUCOSE, CAPILLARY
GLUCOSE-CAPILLARY: 139 mg/dL — AB (ref 65–99)
Glucose-Capillary: 263 mg/dL — ABNORMAL HIGH (ref 65–99)

## 2016-01-18 DIAGNOSIS — E1165 Type 2 diabetes mellitus with hyperglycemia: Secondary | ICD-10-CM | POA: Diagnosis not present

## 2016-01-18 LAB — GLUCOSE, CAPILLARY
GLUCOSE-CAPILLARY: 207 mg/dL — AB (ref 65–99)
Glucose-Capillary: 219 mg/dL — ABNORMAL HIGH (ref 65–99)

## 2016-01-19 DIAGNOSIS — E1165 Type 2 diabetes mellitus with hyperglycemia: Secondary | ICD-10-CM | POA: Diagnosis not present

## 2016-01-19 LAB — GLUCOSE, CAPILLARY
GLUCOSE-CAPILLARY: 184 mg/dL — AB (ref 65–99)
GLUCOSE-CAPILLARY: 166 mg/dL — AB (ref 65–99)

## 2016-01-20 DIAGNOSIS — E1165 Type 2 diabetes mellitus with hyperglycemia: Secondary | ICD-10-CM | POA: Diagnosis not present

## 2016-01-20 LAB — GLUCOSE, CAPILLARY
GLUCOSE-CAPILLARY: 121 mg/dL — AB (ref 65–99)
GLUCOSE-CAPILLARY: 206 mg/dL — AB (ref 65–99)

## 2016-01-21 DIAGNOSIS — E1165 Type 2 diabetes mellitus with hyperglycemia: Secondary | ICD-10-CM | POA: Diagnosis not present

## 2016-01-21 LAB — GLUCOSE, CAPILLARY
GLUCOSE-CAPILLARY: 179 mg/dL — AB (ref 65–99)
Glucose-Capillary: 232 mg/dL — ABNORMAL HIGH (ref 65–99)

## 2016-01-22 DIAGNOSIS — E1165 Type 2 diabetes mellitus with hyperglycemia: Secondary | ICD-10-CM | POA: Diagnosis not present

## 2016-01-22 LAB — GLUCOSE, CAPILLARY
GLUCOSE-CAPILLARY: 156 mg/dL — AB (ref 65–99)
GLUCOSE-CAPILLARY: 356 mg/dL — AB (ref 65–99)

## 2016-01-23 DIAGNOSIS — E1165 Type 2 diabetes mellitus with hyperglycemia: Secondary | ICD-10-CM | POA: Diagnosis not present

## 2016-01-23 LAB — GLUCOSE, CAPILLARY
Glucose-Capillary: 110 mg/dL — ABNORMAL HIGH (ref 65–99)
Glucose-Capillary: 229 mg/dL — ABNORMAL HIGH (ref 65–99)

## 2016-01-24 DIAGNOSIS — E1165 Type 2 diabetes mellitus with hyperglycemia: Secondary | ICD-10-CM | POA: Diagnosis not present

## 2016-01-24 LAB — GLUCOSE, CAPILLARY
Glucose-Capillary: 253 mg/dL — ABNORMAL HIGH (ref 65–99)
Glucose-Capillary: 296 mg/dL — ABNORMAL HIGH (ref 65–99)

## 2016-01-25 DIAGNOSIS — E1165 Type 2 diabetes mellitus with hyperglycemia: Secondary | ICD-10-CM | POA: Diagnosis not present

## 2016-01-25 LAB — GLUCOSE, CAPILLARY
GLUCOSE-CAPILLARY: 137 mg/dL — AB (ref 65–99)
Glucose-Capillary: 198 mg/dL — ABNORMAL HIGH (ref 65–99)

## 2016-01-26 DIAGNOSIS — E1165 Type 2 diabetes mellitus with hyperglycemia: Secondary | ICD-10-CM | POA: Diagnosis not present

## 2016-01-26 LAB — GLUCOSE, CAPILLARY
Glucose-Capillary: 184 mg/dL — ABNORMAL HIGH (ref 65–99)
Glucose-Capillary: 181 mg/dL — ABNORMAL HIGH (ref 65–99)

## 2016-01-27 DIAGNOSIS — E1165 Type 2 diabetes mellitus with hyperglycemia: Secondary | ICD-10-CM | POA: Diagnosis not present

## 2016-01-27 LAB — GLUCOSE, CAPILLARY
Glucose-Capillary: 191 mg/dL — ABNORMAL HIGH (ref 65–99)
Glucose-Capillary: 264 mg/dL — ABNORMAL HIGH (ref 65–99)

## 2016-01-28 DIAGNOSIS — E1165 Type 2 diabetes mellitus with hyperglycemia: Secondary | ICD-10-CM | POA: Diagnosis not present

## 2016-01-28 LAB — GLUCOSE, CAPILLARY
GLUCOSE-CAPILLARY: 182 mg/dL — AB (ref 65–99)
Glucose-Capillary: 217 mg/dL — ABNORMAL HIGH (ref 65–99)

## 2016-01-29 DIAGNOSIS — E1165 Type 2 diabetes mellitus with hyperglycemia: Secondary | ICD-10-CM | POA: Diagnosis not present

## 2016-01-29 LAB — GLUCOSE, CAPILLARY
Glucose-Capillary: 194 mg/dL — ABNORMAL HIGH (ref 65–99)
Glucose-Capillary: 244 mg/dL — ABNORMAL HIGH (ref 65–99)

## 2016-01-30 DIAGNOSIS — E1165 Type 2 diabetes mellitus with hyperglycemia: Secondary | ICD-10-CM | POA: Diagnosis not present

## 2016-01-30 LAB — GLUCOSE, CAPILLARY
GLUCOSE-CAPILLARY: 224 mg/dL — AB (ref 65–99)
Glucose-Capillary: 257 mg/dL — ABNORMAL HIGH (ref 65–99)

## 2016-01-31 DIAGNOSIS — E1165 Type 2 diabetes mellitus with hyperglycemia: Secondary | ICD-10-CM | POA: Diagnosis not present

## 2016-01-31 LAB — GLUCOSE, CAPILLARY
GLUCOSE-CAPILLARY: 233 mg/dL — AB (ref 65–99)
Glucose-Capillary: 252 mg/dL — ABNORMAL HIGH (ref 65–99)

## 2016-02-01 DIAGNOSIS — E1165 Type 2 diabetes mellitus with hyperglycemia: Secondary | ICD-10-CM | POA: Diagnosis not present

## 2016-02-01 LAB — GLUCOSE, CAPILLARY
GLUCOSE-CAPILLARY: 289 mg/dL — AB (ref 65–99)
Glucose-Capillary: 270 mg/dL — ABNORMAL HIGH (ref 65–99)

## 2016-02-02 DIAGNOSIS — E1165 Type 2 diabetes mellitus with hyperglycemia: Secondary | ICD-10-CM | POA: Diagnosis present

## 2016-02-02 LAB — MAGNESIUM: MAGNESIUM: 1.8 mg/dL (ref 1.7–2.4)

## 2016-02-02 LAB — VITAMIN B12: VITAMIN B 12: 991 pg/mL — AB (ref 180–914)

## 2016-02-02 LAB — BASIC METABOLIC PANEL
ANION GAP: 8 (ref 5–15)
BUN: 31 mg/dL — ABNORMAL HIGH (ref 6–20)
CALCIUM: 9.2 mg/dL (ref 8.9–10.3)
CO2: 30 mmol/L (ref 22–32)
Chloride: 100 mmol/L — ABNORMAL LOW (ref 101–111)
Creatinine, Ser: 0.5 mg/dL (ref 0.44–1.00)
GFR calc non Af Amer: >60 mL/min (ref >60)
Glucose, Bld: 272 mg/dL — ABNORMAL HIGH (ref 65–99)
Potassium: 4 mmol/L (ref 3.5–5.1)
SODIUM: 138 mmol/L (ref 135–145)

## 2016-02-02 LAB — HEMOGLOBIN A1C: HEMOGLOBIN A1C: 8.4 % — AB (ref 4.0–6.0)

## 2016-02-02 LAB — GLUCOSE, CAPILLARY
GLUCOSE-CAPILLARY: 268 mg/dL — AB (ref 65–99)
Glucose-Capillary: 231 mg/dL — ABNORMAL HIGH (ref 65–99)

## 2016-02-03 DIAGNOSIS — E1165 Type 2 diabetes mellitus with hyperglycemia: Secondary | ICD-10-CM | POA: Diagnosis not present

## 2016-02-03 LAB — GLUCOSE, CAPILLARY
Glucose-Capillary: 210 mg/dL — ABNORMAL HIGH (ref 65–99)
Glucose-Capillary: 244 mg/dL — ABNORMAL HIGH (ref 65–99)

## 2016-02-03 LAB — VITAMIN D 25 HYDROXY (VIT D DEFICIENCY, FRACTURES): Vit D, 25-Hydroxy: 28.6 ng/mL — ABNORMAL LOW (ref 30.0–100.0)

## 2016-02-04 DIAGNOSIS — E1165 Type 2 diabetes mellitus with hyperglycemia: Secondary | ICD-10-CM | POA: Diagnosis not present

## 2016-02-04 LAB — GLUCOSE, CAPILLARY
Glucose-Capillary: 216 mg/dL — ABNORMAL HIGH (ref 65–99)
Glucose-Capillary: 218 mg/dL — ABNORMAL HIGH (ref 65–99)

## 2016-02-05 DIAGNOSIS — E1165 Type 2 diabetes mellitus with hyperglycemia: Secondary | ICD-10-CM | POA: Diagnosis not present

## 2016-02-05 LAB — GLUCOSE, CAPILLARY
GLUCOSE-CAPILLARY: 232 mg/dL — AB (ref 65–99)
Glucose-Capillary: 218 mg/dL — ABNORMAL HIGH (ref 65–99)

## 2016-02-06 ENCOUNTER — Encounter
Admission: RE | Admit: 2016-02-06 | Discharge: 2016-02-06 | Disposition: A | Discharge status: Home / Self Care | Payer: Medicaid Other | Source: Ambulatory Visit | Attending: Internal Medicine | Admitting: Internal Medicine

## 2016-02-06 DIAGNOSIS — E1165 Type 2 diabetes mellitus with hyperglycemia: Secondary | ICD-10-CM | POA: Insufficient documentation

## 2016-02-06 LAB — GLUCOSE, CAPILLARY
GLUCOSE-CAPILLARY: 148 mg/dL — AB (ref 65–99)
GLUCOSE-CAPILLARY: 206 mg/dL — AB (ref 65–99)

## 2016-02-28 DIAGNOSIS — E1165 Type 2 diabetes mellitus with hyperglycemia: Secondary | ICD-10-CM | POA: Diagnosis not present

## 2016-02-28 LAB — GLUCOSE, CAPILLARY: GLUCOSE-CAPILLARY: 192 mg/dL — AB (ref 65–99)

## 2016-02-29 DIAGNOSIS — E1165 Type 2 diabetes mellitus with hyperglycemia: Secondary | ICD-10-CM | POA: Diagnosis not present

## 2016-02-29 LAB — GLUCOSE, CAPILLARY
GLUCOSE-CAPILLARY: 205 mg/dL — AB (ref 65–99)
Glucose-Capillary: 214 mg/dL — ABNORMAL HIGH (ref 65–99)

## 2016-03-01 DIAGNOSIS — E1165 Type 2 diabetes mellitus with hyperglycemia: Secondary | ICD-10-CM | POA: Diagnosis not present

## 2016-03-01 LAB — GLUCOSE, CAPILLARY
Glucose-Capillary: 253 mg/dL — ABNORMAL HIGH (ref 65–99)
Glucose-Capillary: 273 mg/dL — ABNORMAL HIGH (ref 65–99)

## 2016-03-02 DIAGNOSIS — E1165 Type 2 diabetes mellitus with hyperglycemia: Secondary | ICD-10-CM | POA: Diagnosis not present

## 2016-03-02 LAB — GLUCOSE, CAPILLARY
GLUCOSE-CAPILLARY: 253 mg/dL — AB (ref 65–99)
GLUCOSE-CAPILLARY: 303 mg/dL — AB (ref 65–99)

## 2016-03-03 DIAGNOSIS — E1165 Type 2 diabetes mellitus with hyperglycemia: Secondary | ICD-10-CM | POA: Diagnosis not present

## 2016-03-03 LAB — GLUCOSE, CAPILLARY
GLUCOSE-CAPILLARY: 266 mg/dL — AB (ref 65–99)
Glucose-Capillary: 231 mg/dL — ABNORMAL HIGH (ref 65–99)

## 2016-03-04 DIAGNOSIS — E1165 Type 2 diabetes mellitus with hyperglycemia: Secondary | ICD-10-CM | POA: Diagnosis not present

## 2016-03-04 LAB — GLUCOSE, CAPILLARY
GLUCOSE-CAPILLARY: 190 mg/dL — AB (ref 65–99)
Glucose-Capillary: 191 mg/dL — ABNORMAL HIGH (ref 65–99)

## 2016-03-05 DIAGNOSIS — E1165 Type 2 diabetes mellitus with hyperglycemia: Secondary | ICD-10-CM | POA: Diagnosis not present

## 2016-03-05 LAB — GLUCOSE, CAPILLARY
Glucose-Capillary: 212 mg/dL — ABNORMAL HIGH (ref 65–99)
Glucose-Capillary: 229 mg/dL — ABNORMAL HIGH (ref 65–99)

## 2016-03-06 DIAGNOSIS — E1165 Type 2 diabetes mellitus with hyperglycemia: Secondary | ICD-10-CM | POA: Diagnosis not present

## 2016-03-06 LAB — GLUCOSE, CAPILLARY
Glucose-Capillary: 177 mg/dL — ABNORMAL HIGH (ref 65–99)
Glucose-Capillary: 242 mg/dL — ABNORMAL HIGH (ref 65–99)

## 2016-03-07 DIAGNOSIS — E1165 Type 2 diabetes mellitus with hyperglycemia: Secondary | ICD-10-CM | POA: Diagnosis not present

## 2016-03-07 LAB — GLUCOSE, CAPILLARY
GLUCOSE-CAPILLARY: 245 mg/dL — AB (ref 65–99)
Glucose-Capillary: 251 mg/dL — ABNORMAL HIGH (ref 65–99)

## 2016-03-08 ENCOUNTER — Encounter
Admission: RE | Admit: 2016-03-08 | Discharge: 2016-03-08 | Disposition: A | Discharge status: Home / Self Care | Payer: Medicaid Other | Source: Ambulatory Visit | Attending: Internal Medicine | Admitting: Internal Medicine

## 2016-03-08 DIAGNOSIS — E1165 Type 2 diabetes mellitus with hyperglycemia: Secondary | ICD-10-CM | POA: Insufficient documentation

## 2016-03-08 LAB — GLUCOSE, CAPILLARY: GLUCOSE-CAPILLARY: 257 mg/dL — AB (ref 65–99)

## 2016-03-09 DIAGNOSIS — E1165 Type 2 diabetes mellitus with hyperglycemia: Secondary | ICD-10-CM | POA: Diagnosis not present

## 2016-03-09 LAB — GLUCOSE, CAPILLARY
GLUCOSE-CAPILLARY: 287 mg/dL — AB (ref 65–99)
Glucose-Capillary: 211 mg/dL — ABNORMAL HIGH (ref 65–99)

## 2016-03-10 DIAGNOSIS — E1165 Type 2 diabetes mellitus with hyperglycemia: Secondary | ICD-10-CM | POA: Diagnosis not present

## 2016-03-10 LAB — GLUCOSE, CAPILLARY: Glucose-Capillary: 221 mg/dL — ABNORMAL HIGH (ref 65–99)

## 2016-03-11 DIAGNOSIS — E1165 Type 2 diabetes mellitus with hyperglycemia: Secondary | ICD-10-CM | POA: Diagnosis not present

## 2016-03-11 LAB — GLUCOSE, CAPILLARY
GLUCOSE-CAPILLARY: 249 mg/dL — AB (ref 65–99)
Glucose-Capillary: 225 mg/dL — ABNORMAL HIGH (ref 65–99)
Glucose-Capillary: 202 mg/dL — ABNORMAL HIGH (ref 65–99)

## 2016-03-12 DIAGNOSIS — E1165 Type 2 diabetes mellitus with hyperglycemia: Secondary | ICD-10-CM | POA: Diagnosis not present

## 2016-03-12 LAB — GLUCOSE, CAPILLARY
GLUCOSE-CAPILLARY: 268 mg/dL — AB (ref 65–99)
Glucose-Capillary: 183 mg/dL — ABNORMAL HIGH (ref 65–99)

## 2016-03-13 DIAGNOSIS — E1165 Type 2 diabetes mellitus with hyperglycemia: Secondary | ICD-10-CM | POA: Diagnosis not present

## 2016-03-13 LAB — GLUCOSE, CAPILLARY
Glucose-Capillary: 229 mg/dL — ABNORMAL HIGH (ref 65–99)
Glucose-Capillary: 255 mg/dL — ABNORMAL HIGH (ref 65–99)

## 2016-03-14 DIAGNOSIS — E1165 Type 2 diabetes mellitus with hyperglycemia: Secondary | ICD-10-CM | POA: Diagnosis not present

## 2016-03-14 LAB — GLUCOSE, CAPILLARY
GLUCOSE-CAPILLARY: 226 mg/dL — AB (ref 65–99)
Glucose-Capillary: 323 mg/dL — ABNORMAL HIGH (ref 65–99)

## 2016-03-15 DIAGNOSIS — E1165 Type 2 diabetes mellitus with hyperglycemia: Secondary | ICD-10-CM | POA: Diagnosis not present

## 2016-03-15 LAB — GLUCOSE, CAPILLARY: Glucose-Capillary: 215 mg/dL — ABNORMAL HIGH (ref 65–99)

## 2016-03-16 LAB — GLUCOSE, CAPILLARY
GLUCOSE-CAPILLARY: 178 mg/dL — AB (ref 65–99)
GLUCOSE-CAPILLARY: 180 mg/dL — AB (ref 65–99)
Glucose-Capillary: 228 mg/dL — ABNORMAL HIGH (ref 65–99)

## 2016-03-17 DIAGNOSIS — E1165 Type 2 diabetes mellitus with hyperglycemia: Secondary | ICD-10-CM | POA: Diagnosis not present

## 2016-03-17 LAB — GLUCOSE, CAPILLARY
Glucose-Capillary: 228 mg/dL — ABNORMAL HIGH (ref 65–99)
Glucose-Capillary: 246 mg/dL — ABNORMAL HIGH (ref 65–99)

## 2016-03-18 DIAGNOSIS — E1165 Type 2 diabetes mellitus with hyperglycemia: Secondary | ICD-10-CM | POA: Diagnosis not present

## 2016-03-18 LAB — GLUCOSE, CAPILLARY
GLUCOSE-CAPILLARY: 109 mg/dL — AB (ref 65–99)
Glucose-Capillary: 237 mg/dL — ABNORMAL HIGH (ref 65–99)

## 2016-03-19 DIAGNOSIS — E1165 Type 2 diabetes mellitus with hyperglycemia: Secondary | ICD-10-CM | POA: Diagnosis not present

## 2016-03-19 LAB — GLUCOSE, CAPILLARY
Glucose-Capillary: 193 mg/dL — ABNORMAL HIGH (ref 65–99)
Glucose-Capillary: 216 mg/dL — ABNORMAL HIGH (ref 65–99)

## 2016-03-20 DIAGNOSIS — E1165 Type 2 diabetes mellitus with hyperglycemia: Secondary | ICD-10-CM | POA: Diagnosis not present

## 2016-03-20 LAB — GLUCOSE, CAPILLARY
Glucose-Capillary: 217 mg/dL — ABNORMAL HIGH (ref 65–99)
Glucose-Capillary: 195 mg/dL — ABNORMAL HIGH (ref 65–99)

## 2016-03-21 DIAGNOSIS — E1165 Type 2 diabetes mellitus with hyperglycemia: Secondary | ICD-10-CM | POA: Diagnosis not present

## 2016-03-21 LAB — GLUCOSE, CAPILLARY
Glucose-Capillary: 183 mg/dL — ABNORMAL HIGH (ref 65–99)
Glucose-Capillary: 279 mg/dL — ABNORMAL HIGH (ref 65–99)

## 2016-03-22 DIAGNOSIS — E1165 Type 2 diabetes mellitus with hyperglycemia: Secondary | ICD-10-CM | POA: Diagnosis not present

## 2016-03-22 LAB — GLUCOSE, CAPILLARY
GLUCOSE-CAPILLARY: 227 mg/dL — AB (ref 65–99)
Glucose-Capillary: 171 mg/dL — ABNORMAL HIGH (ref 65–99)

## 2016-03-23 DIAGNOSIS — E1165 Type 2 diabetes mellitus with hyperglycemia: Secondary | ICD-10-CM | POA: Diagnosis not present

## 2016-03-23 LAB — GLUCOSE, CAPILLARY
Glucose-Capillary: 252 mg/dL — ABNORMAL HIGH (ref 65–99)
Glucose-Capillary: 210 mg/dL — ABNORMAL HIGH (ref 65–99)

## 2016-03-24 DIAGNOSIS — E1165 Type 2 diabetes mellitus with hyperglycemia: Secondary | ICD-10-CM | POA: Diagnosis not present

## 2016-03-24 LAB — GLUCOSE, CAPILLARY
GLUCOSE-CAPILLARY: 185 mg/dL — AB (ref 65–99)
GLUCOSE-CAPILLARY: 239 mg/dL — AB (ref 65–99)

## 2016-03-25 DIAGNOSIS — E1165 Type 2 diabetes mellitus with hyperglycemia: Secondary | ICD-10-CM | POA: Diagnosis not present

## 2016-03-25 LAB — GLUCOSE, CAPILLARY
GLUCOSE-CAPILLARY: 213 mg/dL — AB (ref 65–99)
GLUCOSE-CAPILLARY: 242 mg/dL — AB (ref 65–99)

## 2016-03-26 DIAGNOSIS — E1165 Type 2 diabetes mellitus with hyperglycemia: Secondary | ICD-10-CM | POA: Diagnosis not present

## 2016-03-26 LAB — GLUCOSE, CAPILLARY
GLUCOSE-CAPILLARY: 192 mg/dL — AB (ref 65–99)
GLUCOSE-CAPILLARY: 210 mg/dL — AB (ref 65–99)

## 2016-03-27 DIAGNOSIS — E1165 Type 2 diabetes mellitus with hyperglycemia: Secondary | ICD-10-CM | POA: Diagnosis not present

## 2016-03-27 LAB — GLUCOSE, CAPILLARY
GLUCOSE-CAPILLARY: 224 mg/dL — AB (ref 65–99)
Glucose-Capillary: 190 mg/dL — ABNORMAL HIGH (ref 65–99)

## 2016-03-28 DIAGNOSIS — E1165 Type 2 diabetes mellitus with hyperglycemia: Secondary | ICD-10-CM | POA: Diagnosis not present

## 2016-03-28 LAB — GLUCOSE, CAPILLARY
GLUCOSE-CAPILLARY: 206 mg/dL — AB (ref 65–99)
Glucose-Capillary: 224 mg/dL — ABNORMAL HIGH (ref 65–99)

## 2016-03-29 DIAGNOSIS — E1165 Type 2 diabetes mellitus with hyperglycemia: Secondary | ICD-10-CM | POA: Diagnosis not present

## 2016-03-29 LAB — GLUCOSE, CAPILLARY
GLUCOSE-CAPILLARY: 253 mg/dL — AB (ref 65–99)
Glucose-Capillary: 214 mg/dL — ABNORMAL HIGH (ref 65–99)

## 2016-03-30 DIAGNOSIS — E1165 Type 2 diabetes mellitus with hyperglycemia: Secondary | ICD-10-CM | POA: Diagnosis not present

## 2016-03-30 LAB — GLUCOSE, CAPILLARY
Glucose-Capillary: 215 mg/dL — ABNORMAL HIGH (ref 65–99)
Glucose-Capillary: 247 mg/dL — ABNORMAL HIGH (ref 65–99)

## 2016-03-31 DIAGNOSIS — E1165 Type 2 diabetes mellitus with hyperglycemia: Secondary | ICD-10-CM | POA: Diagnosis not present

## 2016-03-31 LAB — GLUCOSE, CAPILLARY
GLUCOSE-CAPILLARY: 216 mg/dL — AB (ref 65–99)
Glucose-Capillary: 215 mg/dL — ABNORMAL HIGH (ref 65–99)

## 2016-04-01 DIAGNOSIS — E1165 Type 2 diabetes mellitus with hyperglycemia: Secondary | ICD-10-CM | POA: Diagnosis not present

## 2016-04-01 LAB — GLUCOSE, CAPILLARY
GLUCOSE-CAPILLARY: 274 mg/dL — AB (ref 65–99)
Glucose-Capillary: 226 mg/dL — ABNORMAL HIGH (ref 65–99)

## 2016-04-02 DIAGNOSIS — E1165 Type 2 diabetes mellitus with hyperglycemia: Secondary | ICD-10-CM | POA: Diagnosis not present

## 2016-04-02 LAB — GLUCOSE, CAPILLARY
GLUCOSE-CAPILLARY: 264 mg/dL — AB (ref 65–99)
Glucose-Capillary: 228 mg/dL — ABNORMAL HIGH (ref 65–99)

## 2016-04-03 DIAGNOSIS — E1165 Type 2 diabetes mellitus with hyperglycemia: Secondary | ICD-10-CM | POA: Diagnosis not present

## 2016-04-03 LAB — GLUCOSE, CAPILLARY
Glucose-Capillary: 280 mg/dL — ABNORMAL HIGH (ref 65–99)
Glucose-Capillary: 224 mg/dL — ABNORMAL HIGH (ref 65–99)

## 2016-04-04 DIAGNOSIS — E1165 Type 2 diabetes mellitus with hyperglycemia: Secondary | ICD-10-CM | POA: Diagnosis not present

## 2016-04-04 LAB — GLUCOSE, CAPILLARY
GLUCOSE-CAPILLARY: 196 mg/dL — AB (ref 65–99)
Glucose-Capillary: 194 mg/dL — ABNORMAL HIGH (ref 65–99)

## 2016-04-05 DIAGNOSIS — E1165 Type 2 diabetes mellitus with hyperglycemia: Secondary | ICD-10-CM | POA: Diagnosis not present

## 2016-04-05 LAB — GLUCOSE, CAPILLARY
GLUCOSE-CAPILLARY: 169 mg/dL — AB (ref 65–99)
GLUCOSE-CAPILLARY: 180 mg/dL — AB (ref 65–99)

## 2016-04-06 DIAGNOSIS — E1165 Type 2 diabetes mellitus with hyperglycemia: Secondary | ICD-10-CM | POA: Diagnosis not present

## 2016-04-06 LAB — GLUCOSE, CAPILLARY
GLUCOSE-CAPILLARY: 148 mg/dL — AB (ref 65–99)
GLUCOSE-CAPILLARY: 169 mg/dL — AB (ref 65–99)

## 2016-04-07 DIAGNOSIS — E1165 Type 2 diabetes mellitus with hyperglycemia: Secondary | ICD-10-CM | POA: Diagnosis not present

## 2016-04-07 LAB — GLUCOSE, CAPILLARY
GLUCOSE-CAPILLARY: 145 mg/dL — AB (ref 65–99)
GLUCOSE-CAPILLARY: 186 mg/dL — AB (ref 65–99)

## 2016-04-08 ENCOUNTER — Encounter
Admission: RE | Admit: 2016-04-08 | Discharge: 2016-04-08 | Disposition: A | Discharge status: Home / Self Care | Payer: Medicaid Other | Source: Ambulatory Visit | Attending: Internal Medicine | Admitting: Internal Medicine

## 2016-04-08 DIAGNOSIS — E1165 Type 2 diabetes mellitus with hyperglycemia: Secondary | ICD-10-CM | POA: Diagnosis present

## 2016-04-08 LAB — GLUCOSE, CAPILLARY: Glucose-Capillary: 159 mg/dL — ABNORMAL HIGH (ref 65–99)

## 2016-04-09 DIAGNOSIS — E1165 Type 2 diabetes mellitus with hyperglycemia: Secondary | ICD-10-CM | POA: Diagnosis not present

## 2016-04-09 LAB — GLUCOSE, CAPILLARY
Glucose-Capillary: 172 mg/dL — ABNORMAL HIGH (ref 65–99)
Glucose-Capillary: 198 mg/dL — ABNORMAL HIGH (ref 65–99)

## 2016-04-10 DIAGNOSIS — E1165 Type 2 diabetes mellitus with hyperglycemia: Secondary | ICD-10-CM | POA: Diagnosis not present

## 2016-04-10 LAB — GLUCOSE, CAPILLARY
Glucose-Capillary: 172 mg/dL — ABNORMAL HIGH (ref 65–99)
Glucose-Capillary: 192 mg/dL — ABNORMAL HIGH (ref 65–99)

## 2016-04-11 DIAGNOSIS — E1165 Type 2 diabetes mellitus with hyperglycemia: Secondary | ICD-10-CM | POA: Diagnosis not present

## 2016-04-11 LAB — GLUCOSE, CAPILLARY
GLUCOSE-CAPILLARY: 185 mg/dL — AB (ref 65–99)
Glucose-Capillary: 173 mg/dL — ABNORMAL HIGH (ref 65–99)

## 2016-04-12 DIAGNOSIS — E1165 Type 2 diabetes mellitus with hyperglycemia: Secondary | ICD-10-CM | POA: Diagnosis not present

## 2016-04-12 LAB — GLUCOSE, CAPILLARY
GLUCOSE-CAPILLARY: 173 mg/dL — AB (ref 65–99)
Glucose-Capillary: 167 mg/dL — ABNORMAL HIGH (ref 65–99)

## 2016-04-13 ENCOUNTER — Non-Acute Institutional Stay (SKILLED_NURSING_FACILITY): Payer: Medicaid Other | Admitting: Gerontology

## 2016-04-13 DIAGNOSIS — I10 Essential (primary) hypertension: Secondary | ICD-10-CM | POA: Diagnosis not present

## 2016-04-13 DIAGNOSIS — E1165 Type 2 diabetes mellitus with hyperglycemia: Secondary | ICD-10-CM | POA: Diagnosis not present

## 2016-04-13 DIAGNOSIS — E119 Type 2 diabetes mellitus without complications: Secondary | ICD-10-CM | POA: Diagnosis not present

## 2016-04-13 LAB — GLUCOSE, CAPILLARY
Glucose-Capillary: 139 mg/dL — ABNORMAL HIGH (ref 65–99)
Glucose-Capillary: 189 mg/dL — ABNORMAL HIGH (ref 65–99)

## 2016-04-13 NOTE — Progress Notes (Signed)
Location:      Place of Service:  SNF (31) Provider:  Lorenso Quarry, NP-C  [facility] (Inactive)  Patient Care Team: [facility] (Inactive) as PCP - General  Extended Emergency Contact Information Primary Emergency Contact: Dillard,Julia Address: [address]          [place], [place] [address] [place] Phone: [phone] Relation: Daughter Secondary Emergency Contact: Johna Roles [place] Mobile Phone: [phone] Relation: Daughter  Code Status:  DNR Goals of care: Advanced Directive information Advanced Directives 06/17/2015  Does patient have an advance directive? -  Type of Advance Directive -  Does patient want to make changes to advanced directive? -  Copy of advanced directive(s) in chart? Yes  Would patient like information on creating an advanced directive? -  Pre-existing out of facility DNR order (yellow form or pink MOST form) -     Chief Complaint  Patient presents with  . Medical Management of Chronic Issues    HPI:  Pt is a 65 y.o. female seen today for medical management of chronic diseases. She is seei today for follow up and management of diabetes mellitus without complication as well as hypertension. Her hypertension has been well controlled without medications, diet only. She requested dietary modifications and attempt to lose more weight. Patient reports she used to weigh over 400 lbs. She is now 37. And wanting to lose more. Insulin was changed 2 weeks ago to toujeo. Fasting blood sugars have now leveled out and are maintaining at levels less than 200. Recent A1C was 8.4. Insulin adjustments made periodically. Patient reports she is feeling and doing well. Vital signs stable. No other complaints.    Past Medical History:  Diagnosis Date  . Cerebral palsy (HCC)   . CHF (congestive heart failure) (HCC)   . Diabetes mellitus without complication (HCC)   . Hypertension    Past Surgical  History:  Procedure Laterality Date  . ABDOMINAL HYSTERECTOMY    . cerbral palsy    . MOUTH SURGERY      Allergies  Allergen Reactions  . Promethazine     She has a possible respiratory reaction with this medication with RR 4 and hypoxia a few minutes after administration. Other respiratory depressants like benzos should be used with caution.  . Sulfa Antibiotics Swelling and Rash      Medication List       Accurate as of 04/13/16 11:42 AM. Always use your most recent med list.          acetaminophen 325 MG tablet Commonly known as:  TYLENOL Take 650 mg by mouth every 4 (four) hours as needed.   aspirin 81 MG chewable tablet Chew 81 mg by mouth daily.   atorvastatin 40 MG tablet Commonly known as:  LIPITOR Take 40 mg by mouth daily.   busPIRone 7.5 MG tablet Commonly known as:  BUSPAR Take 1 tablet (7.5 mg total) by mouth 2 (two) times daily.   cefTRIAXone 1 g in dextrose 5 % 50 mL Inject 1 g into the vein daily.   diphenhydrAMINE 25 mg capsule Commonly known as:  BENADRYL Take 25 mg by mouth as needed.   feeding supplement (PRO-STAT SUGAR FREE 64) Liqd Take 30 mLs by mouth 2 (two) times daily at 10 AM and 5 PM.   FIRST AID PLUS LIDOCAINE EX Apply 1 application topically as needed (apply to broken skin areas).   Fish Oil 1200 MG Caps Take 1  capsule by mouth daily.   FLUoxetine 20 MG capsule Commonly known as:  PROZAC Take 1 capsule (20 mg total) by mouth daily.   insulin aspart cartridge Commonly known as:  NOVOLOG Correction coverage: Sensitive (thin, NPO, renal)     CBG < 70: implement hypoglycemia protocol    CBG 70 - 120: 0 units    CBG 121 - 150: 1 unit    CBG 151 - 200: 2 units    CBG 201 - 250: 3 units    CBG 251 - 300: 5 units    CBG 301 - 350: 7 units    CBG 351 - 400 9 units    CBG > 400 call MD and obtain STAT lab verification   insulin detemir 100 UNIT/ML injection Commonly known as:  LEVEMIR Inject 38 Units into the skin at  bedtime.   lamoTRIgine 25 MG tablet Commonly known as:  LAMICTAL Take 25 mg by mouth daily.   loperamide 2 MG capsule Commonly known as:  IMODIUM Take 1 capsule (2 mg total) by mouth as needed for diarrhea or loose stools.   LORazepam 0.5 MG tablet Commonly known as:  ATIVAN Take 0.5 mg by mouth every 4 (four) hours as needed for anxiety.   magnesium oxide 400 MG tablet Commonly known as:  MAG-OX Take 250 mg by mouth daily.   multivitamin capsule Take 1 capsule by mouth daily.   NO FLUSH NIACIN 500 MG Tabs Generic drug:  Inositol Niacinate Take 1 tablet by mouth daily.   ondansetron 4 MG tablet Commonly known as:  ZOFRAN Take 1 tablet (4 mg total) by mouth every 8 (eight) hours as needed for nausea.   oxyCODONE-acetaminophen 5-325 MG tablet Commonly known as:  PERCOCET/ROXICET Take 1 tablet by mouth every 6 (six) hours as needed for moderate pain.   pantoprazole 40 MG tablet Commonly known as:  PROTONIX Take 1 tablet (40 mg total) by mouth daily.   potassium chloride 10 MEQ tablet Commonly known as:  K-DUR Take 1 tablet (10 mEq total) by mouth daily.   sennosides-docusate sodium 8.6-50 MG tablet Commonly known as:  SENOKOT-S Take 1 tablet by mouth 2 (two) times daily.   venlafaxine 75 MG tablet Commonly known as:  EFFEXOR Take 75 mg by mouth 1 day or 1 dose.   VITAMIN D3 PO Take 1 tablet by mouth daily. 200 units/tablet       Review of Systems  Constitutional: Negative for activity change, appetite change, chills, diaphoresis and fever.  HENT: Negative for congestion, sneezing, sore throat, trouble swallowing and voice change.   Eyes: Negative for pain, redness and visual disturbance.  Respiratory: Negative for apnea, cough, choking, chest tightness, shortness of breath and wheezing.   Cardiovascular: Negative for chest pain, palpitations and leg swelling.  Gastrointestinal: Negative for abdominal distention, abdominal pain, constipation, diarrhea and  nausea.  Endocrine: Positive for cold intolerance.  Genitourinary: Negative for difficulty urinating, dysuria, frequency and urgency.  Musculoskeletal: Negative for back pain, gait problem and myalgias. Arthralgias: typical arthritis.  Skin: Negative for color change, pallor, rash and wound.  Neurological: Negative for dizziness, tremors, syncope, speech difficulty, weakness, numbness and headaches.  Psychiatric/Behavioral: Negative for agitation and behavioral problems.  All other systems reviewed and are negative.   Immunization History  Administered Date(s) Administered  . Influenza,inj,Quad PF,36+ Mos 04/06/2015   Pertinent  Health Maintenance Due  Topic Date Due  . FOOT EXAM  03/07/1963  . OPHTHALMOLOGY EXAM  03/07/1963  . PAP SMEAR  03/06/1974  . MAMMOGRAM  03/07/2003  . COLONOSCOPY  03/07/2003  . URINE MICROALBUMIN  10/28/2013  . INFLUENZA VACCINE  03/08/2016  . HEMOGLOBIN A1C  08/03/2016   No flowsheet data found. Functional Status Survey:    Vitals:   04/04/16 0500  BP: (!) 152/68  Pulse: 81  Resp: 20  Temp: 97.9 F (36.6 C)  SpO2: 99%  Weight: 209 lb 3.2 oz (94.9 kg)   Body mass index is 39.53 kg/m. Physical Exam  Constitutional: She is oriented to person, place, and time. Vital signs are normal. She appears well-developed and well-nourished. She is active and cooperative. She does not appear ill. No distress.  HENT:  Head: Normocephalic and atraumatic.  Mouth/Throat: Uvula is midline, oropharynx is clear and moist and mucous membranes are normal. Mucous membranes are not pale, not dry and not cyanotic.  Eyes: Conjunctivae, EOM and lids are normal. Pupils are equal, round, and reactive to light.  Neck: Trachea normal, normal range of motion and full passive range of motion without pain. Neck supple. No JVD present. No tracheal deviation, no edema and no erythema present. No thyromegaly present.  Cardiovascular: Normal rate, regular rhythm, normal heart  sounds, intact distal pulses and normal pulses.  Exam reveals no gallop, no distant heart sounds and no friction rub.   No murmur heard. Pulmonary/Chest: Effort normal and breath sounds normal. No accessory muscle usage. No respiratory distress. She has no wheezes. She has no rales. She exhibits no tenderness.  Abdominal: Normal appearance and bowel sounds are normal. She exhibits no distension and no ascites. There is no tenderness.  Musculoskeletal: Normal range of motion. She exhibits no edema or tenderness.  Expected osteoarthritis, stiffness  Neurological: She is alert and oriented to person, place, and time. She has normal strength.  Skin: Skin is warm, dry and intact. No rash noted. She is not diaphoretic. No cyanosis or erythema. No pallor. Nails show no clubbing.  Foot exam- negative for ulcers or other wounds. Sensation intact. C/O cold feet. Nail in good condition  Psychiatric: She has a normal mood and affect. Her speech is normal and behavior is normal. Judgment and thought content normal. Cognition and memory are normal.  Nursing note and vitals reviewed.   Labs reviewed:  Recent Labs  04/16/15 0450  06/18/15 0500 07/28/15 0802 02/02/16 0818  NA 137  < > 140 141 138  K 3.5  < > 2.9* 3.7 4.0  CL 100*  < > 102 101 100*  CO2 30  < > 27 32 30  GLUCOSE 214*  < > 223* 95 272*  BUN 23*  < > 30* 27* 31*  CREATININE 0.42*  < > 0.41* <0.30* 0.50  CALCIUM 8.2*  < > 9.3 9.3 9.2  MG 1.6*  --   --   --  1.8  < > = values in this interval not displayed.  Recent Labs  06/11/15 0630 06/18/15 0500 07/28/15 0802  AST 14* 17 20  ALT 18 12* 27  ALKPHOS 69 84 92  BILITOT 0.4 0.5 0.6  PROT 6.7 7.0 6.9  ALBUMIN 3.3* 3.6 3.7    Recent Labs  06/04/15 0715 06/11/15 0630 06/18/15 0500  WBC 5.8 5.6 6.8  NEUTROABS 2.6 2.3 3.1  HGB 11.9* 12.2 12.3  HCT 35.9 36.7 37.6  MCV 91.0 90.5 91.9  PLT 312 274 346   Lab Results  Component Value Date   TSH 4.053 04/19/2015   Lab  Results  Component Value Date  HGBA1C 8.4 (H) 02/02/2016   Lab Results  Component Value Date   CHOL 65 04/16/2015   HDL 14 (L) 04/16/2015   LDLCALC 36 04/16/2015   TRIG 76 04/16/2015   CHOLHDL 4.6 04/16/2015    Significant Diagnostic Results in last 30 days:  No results found.  Assessment/Plan 1. Diabetes mellitus without complication (HCC)  Continue Toujeo insulin.   Titrate dose to avoid hypoglycemic episodes  Maintain FSBS less than 200  Carb consistent diet  No breads or potatoes on trays- per pt request: pt attempting weight loss  2. Essential hypertension  Continue current regimen. (No meds)  B/Ps well controlled  Well controlled with diet  Continue attempts at weight loss  Family/ staff Communication:   Total Time:  Documentation:  Face to Face:  Family/Phone:   Labs/tests ordered:  A1C in 1 month  Medication list reviewed and assessed for continued appropriateness. Monthly medication orders reviewed and signed.  Brynda RimShannon H. Neev Mcmains, NP-C [facility] [phone] [place], [place] [address] Cell Phone (Mon-Fri 8am-5pm):  [phone] On Call:  [phone] & follow prompts after 5pm & weekends Office Phone:  [phone] Office Fax:  [phone]

## 2016-04-14 DIAGNOSIS — E1165 Type 2 diabetes mellitus with hyperglycemia: Secondary | ICD-10-CM | POA: Diagnosis not present

## 2016-04-14 LAB — GLUCOSE, CAPILLARY
Glucose-Capillary: 207 mg/dL — ABNORMAL HIGH (ref 65–99)
Glucose-Capillary: 220 mg/dL — ABNORMAL HIGH (ref 65–99)

## 2016-04-15 DIAGNOSIS — E1165 Type 2 diabetes mellitus with hyperglycemia: Secondary | ICD-10-CM | POA: Diagnosis not present

## 2016-04-15 LAB — GLUCOSE, CAPILLARY
Glucose-Capillary: 132 mg/dL — ABNORMAL HIGH (ref 65–99)
Glucose-Capillary: 172 mg/dL — ABNORMAL HIGH (ref 65–99)

## 2016-04-16 DIAGNOSIS — E1165 Type 2 diabetes mellitus with hyperglycemia: Secondary | ICD-10-CM | POA: Diagnosis not present

## 2016-04-16 LAB — GLUCOSE, CAPILLARY
GLUCOSE-CAPILLARY: 121 mg/dL — AB (ref 65–99)
Glucose-Capillary: 192 mg/dL — ABNORMAL HIGH (ref 65–99)

## 2016-04-17 DIAGNOSIS — E1165 Type 2 diabetes mellitus with hyperglycemia: Secondary | ICD-10-CM | POA: Diagnosis not present

## 2016-04-17 LAB — GLUCOSE, CAPILLARY
GLUCOSE-CAPILLARY: 148 mg/dL — AB (ref 65–99)
Glucose-Capillary: 184 mg/dL — ABNORMAL HIGH (ref 65–99)

## 2016-04-18 DIAGNOSIS — E1165 Type 2 diabetes mellitus with hyperglycemia: Secondary | ICD-10-CM | POA: Diagnosis not present

## 2016-04-18 LAB — GLUCOSE, CAPILLARY
Glucose-Capillary: 188 mg/dL — ABNORMAL HIGH (ref 65–99)
Glucose-Capillary: 212 mg/dL — ABNORMAL HIGH (ref 65–99)

## 2016-04-19 DIAGNOSIS — E1165 Type 2 diabetes mellitus with hyperglycemia: Secondary | ICD-10-CM | POA: Diagnosis not present

## 2016-04-19 LAB — GLUCOSE, CAPILLARY: Glucose-Capillary: 187 mg/dL — ABNORMAL HIGH (ref 65–99)

## 2016-04-20 DIAGNOSIS — E1165 Type 2 diabetes mellitus with hyperglycemia: Secondary | ICD-10-CM | POA: Diagnosis not present

## 2016-04-20 LAB — GLUCOSE, CAPILLARY
GLUCOSE-CAPILLARY: 206 mg/dL — AB (ref 65–99)
GLUCOSE-CAPILLARY: 217 mg/dL — AB (ref 65–99)

## 2016-04-21 DIAGNOSIS — E1165 Type 2 diabetes mellitus with hyperglycemia: Secondary | ICD-10-CM | POA: Diagnosis not present

## 2016-04-21 LAB — GLUCOSE, CAPILLARY
GLUCOSE-CAPILLARY: 200 mg/dL — AB (ref 65–99)
Glucose-Capillary: 197 mg/dL — ABNORMAL HIGH (ref 65–99)

## 2016-04-22 DIAGNOSIS — E1165 Type 2 diabetes mellitus with hyperglycemia: Secondary | ICD-10-CM | POA: Diagnosis not present

## 2016-04-22 LAB — GLUCOSE, CAPILLARY
GLUCOSE-CAPILLARY: 137 mg/dL — AB (ref 65–99)
Glucose-Capillary: 145 mg/dL — ABNORMAL HIGH (ref 65–99)

## 2016-04-23 DIAGNOSIS — E1165 Type 2 diabetes mellitus with hyperglycemia: Secondary | ICD-10-CM | POA: Diagnosis not present

## 2016-04-23 LAB — GLUCOSE, CAPILLARY
GLUCOSE-CAPILLARY: 170 mg/dL — AB (ref 65–99)
Glucose-Capillary: 151 mg/dL — ABNORMAL HIGH (ref 65–99)

## 2016-04-24 DIAGNOSIS — E1165 Type 2 diabetes mellitus with hyperglycemia: Secondary | ICD-10-CM | POA: Diagnosis not present

## 2016-04-24 LAB — GLUCOSE, CAPILLARY
GLUCOSE-CAPILLARY: 201 mg/dL — AB (ref 65–99)
Glucose-Capillary: 137 mg/dL — ABNORMAL HIGH (ref 65–99)

## 2016-04-25 DIAGNOSIS — E1165 Type 2 diabetes mellitus with hyperglycemia: Secondary | ICD-10-CM | POA: Diagnosis not present

## 2016-04-25 LAB — GLUCOSE, CAPILLARY
GLUCOSE-CAPILLARY: 128 mg/dL — AB (ref 65–99)
GLUCOSE-CAPILLARY: 133 mg/dL — AB (ref 65–99)

## 2016-04-26 DIAGNOSIS — E1165 Type 2 diabetes mellitus with hyperglycemia: Secondary | ICD-10-CM | POA: Diagnosis not present

## 2016-04-26 LAB — GLUCOSE, CAPILLARY
GLUCOSE-CAPILLARY: 88 mg/dL (ref 65–99)
Glucose-Capillary: 173 mg/dL — ABNORMAL HIGH (ref 65–99)

## 2016-04-27 DIAGNOSIS — E1165 Type 2 diabetes mellitus with hyperglycemia: Secondary | ICD-10-CM | POA: Diagnosis not present

## 2016-04-27 LAB — GLUCOSE, CAPILLARY
GLUCOSE-CAPILLARY: 115 mg/dL — AB (ref 65–99)
Glucose-Capillary: 230 mg/dL — ABNORMAL HIGH (ref 65–99)

## 2016-04-28 DIAGNOSIS — E1165 Type 2 diabetes mellitus with hyperglycemia: Secondary | ICD-10-CM | POA: Diagnosis not present

## 2016-04-28 LAB — GLUCOSE, CAPILLARY
Glucose-Capillary: 131 mg/dL — ABNORMAL HIGH (ref 65–99)
Glucose-Capillary: 178 mg/dL — ABNORMAL HIGH (ref 65–99)

## 2016-04-29 DIAGNOSIS — E1165 Type 2 diabetes mellitus with hyperglycemia: Secondary | ICD-10-CM | POA: Diagnosis not present

## 2016-04-29 LAB — GLUCOSE, CAPILLARY
GLUCOSE-CAPILLARY: 129 mg/dL — AB (ref 65–99)
Glucose-Capillary: 117 mg/dL — ABNORMAL HIGH (ref 65–99)

## 2016-04-30 DIAGNOSIS — E1165 Type 2 diabetes mellitus with hyperglycemia: Secondary | ICD-10-CM | POA: Diagnosis not present

## 2016-04-30 LAB — GLUCOSE, CAPILLARY
GLUCOSE-CAPILLARY: 139 mg/dL — AB (ref 65–99)
GLUCOSE-CAPILLARY: 158 mg/dL — AB (ref 65–99)

## 2016-05-01 DIAGNOSIS — E1165 Type 2 diabetes mellitus with hyperglycemia: Secondary | ICD-10-CM | POA: Diagnosis not present

## 2016-05-01 LAB — GLUCOSE, CAPILLARY
GLUCOSE-CAPILLARY: 129 mg/dL — AB (ref 65–99)
GLUCOSE-CAPILLARY: 175 mg/dL — AB (ref 65–99)

## 2016-05-02 DIAGNOSIS — E1165 Type 2 diabetes mellitus with hyperglycemia: Secondary | ICD-10-CM | POA: Diagnosis not present

## 2016-05-02 LAB — GLUCOSE, CAPILLARY
GLUCOSE-CAPILLARY: 142 mg/dL — AB (ref 65–99)
GLUCOSE-CAPILLARY: 173 mg/dL — AB (ref 65–99)
Glucose-Capillary: 174 mg/dL — ABNORMAL HIGH (ref 65–99)

## 2016-05-03 DIAGNOSIS — E1165 Type 2 diabetes mellitus with hyperglycemia: Secondary | ICD-10-CM | POA: Diagnosis not present

## 2016-05-03 LAB — GLUCOSE, CAPILLARY
GLUCOSE-CAPILLARY: 161 mg/dL — AB (ref 65–99)
GLUCOSE-CAPILLARY: 218 mg/dL — AB (ref 65–99)

## 2016-05-04 DIAGNOSIS — E1165 Type 2 diabetes mellitus with hyperglycemia: Secondary | ICD-10-CM | POA: Diagnosis not present

## 2016-05-04 LAB — GLUCOSE, CAPILLARY
GLUCOSE-CAPILLARY: 178 mg/dL — AB (ref 65–99)
GLUCOSE-CAPILLARY: 207 mg/dL — AB (ref 65–99)

## 2016-05-04 LAB — HEMOGLOBIN A1C
HEMOGLOBIN A1C: 8.7 % — AB (ref 4.8–5.6)
Mean Plasma Glucose: 203 mg/dL

## 2016-05-05 DIAGNOSIS — E1165 Type 2 diabetes mellitus with hyperglycemia: Secondary | ICD-10-CM | POA: Diagnosis not present

## 2016-05-05 LAB — GLUCOSE, CAPILLARY: Glucose-Capillary: 231 mg/dL — ABNORMAL HIGH (ref 65–99)

## 2016-05-06 DIAGNOSIS — E1165 Type 2 diabetes mellitus with hyperglycemia: Secondary | ICD-10-CM | POA: Diagnosis not present

## 2016-05-06 LAB — GLUCOSE, CAPILLARY: GLUCOSE-CAPILLARY: 187 mg/dL — AB (ref 65–99)

## 2016-05-07 DIAGNOSIS — E1165 Type 2 diabetes mellitus with hyperglycemia: Secondary | ICD-10-CM | POA: Diagnosis not present

## 2016-05-07 LAB — GLUCOSE, CAPILLARY
GLUCOSE-CAPILLARY: 179 mg/dL — AB (ref 65–99)
Glucose-Capillary: 221 mg/dL — ABNORMAL HIGH (ref 65–99)

## 2016-05-08 ENCOUNTER — Encounter
Admission: RE | Admit: 2016-05-08 | Discharge: 2016-05-08 | Disposition: A | Discharge status: Home / Self Care | Payer: Medicaid Other | Source: Ambulatory Visit | Attending: Internal Medicine | Admitting: Internal Medicine

## 2016-05-08 DIAGNOSIS — E119 Type 2 diabetes mellitus without complications: Secondary | ICD-10-CM | POA: Insufficient documentation

## 2016-05-08 LAB — GLUCOSE, CAPILLARY
GLUCOSE-CAPILLARY: 127 mg/dL — AB (ref 65–99)
GLUCOSE-CAPILLARY: 139 mg/dL — AB (ref 65–99)

## 2016-05-30 DIAGNOSIS — E119 Type 2 diabetes mellitus without complications: Secondary | ICD-10-CM | POA: Diagnosis not present

## 2016-05-30 LAB — GLUCOSE, CAPILLARY: GLUCOSE-CAPILLARY: 200 mg/dL — AB (ref 65–99)

## 2016-05-31 DIAGNOSIS — E119 Type 2 diabetes mellitus without complications: Secondary | ICD-10-CM | POA: Diagnosis not present

## 2016-05-31 LAB — GLUCOSE, CAPILLARY
GLUCOSE-CAPILLARY: 180 mg/dL — AB (ref 65–99)
Glucose-Capillary: 137 mg/dL — ABNORMAL HIGH (ref 65–99)

## 2016-06-01 DIAGNOSIS — E119 Type 2 diabetes mellitus without complications: Secondary | ICD-10-CM | POA: Diagnosis not present

## 2016-06-01 LAB — GLUCOSE, CAPILLARY: Glucose-Capillary: 169 mg/dL — ABNORMAL HIGH (ref 65–99)

## 2016-06-02 DIAGNOSIS — E119 Type 2 diabetes mellitus without complications: Secondary | ICD-10-CM | POA: Diagnosis not present

## 2016-06-02 LAB — GLUCOSE, CAPILLARY
Glucose-Capillary: 158 mg/dL — ABNORMAL HIGH (ref 65–99)
Glucose-Capillary: 223 mg/dL — ABNORMAL HIGH (ref 65–99)

## 2016-06-03 DIAGNOSIS — E119 Type 2 diabetes mellitus without complications: Secondary | ICD-10-CM | POA: Diagnosis not present

## 2016-06-03 LAB — GLUCOSE, CAPILLARY
Glucose-Capillary: 171 mg/dL — ABNORMAL HIGH (ref 65–99)
Glucose-Capillary: 198 mg/dL — ABNORMAL HIGH (ref 65–99)

## 2016-06-04 DIAGNOSIS — E119 Type 2 diabetes mellitus without complications: Secondary | ICD-10-CM | POA: Diagnosis not present

## 2016-06-04 LAB — GLUCOSE, CAPILLARY
GLUCOSE-CAPILLARY: 203 mg/dL — AB (ref 65–99)
GLUCOSE-CAPILLARY: 161 mg/dL — AB (ref 65–99)

## 2016-06-05 DIAGNOSIS — E119 Type 2 diabetes mellitus without complications: Secondary | ICD-10-CM | POA: Diagnosis not present

## 2016-06-05 LAB — GLUCOSE, CAPILLARY
GLUCOSE-CAPILLARY: 155 mg/dL — AB (ref 65–99)
GLUCOSE-CAPILLARY: 218 mg/dL — AB (ref 65–99)

## 2016-06-06 DIAGNOSIS — E119 Type 2 diabetes mellitus without complications: Secondary | ICD-10-CM | POA: Diagnosis not present

## 2016-06-06 LAB — GLUCOSE, CAPILLARY
GLUCOSE-CAPILLARY: 177 mg/dL — AB (ref 65–99)
GLUCOSE-CAPILLARY: 275 mg/dL — AB (ref 65–99)

## 2016-06-07 DIAGNOSIS — E119 Type 2 diabetes mellitus without complications: Secondary | ICD-10-CM | POA: Diagnosis not present

## 2016-06-07 LAB — GLUCOSE, CAPILLARY
GLUCOSE-CAPILLARY: 240 mg/dL — AB (ref 65–99)
Glucose-Capillary: 181 mg/dL — ABNORMAL HIGH (ref 65–99)

## 2016-06-08 ENCOUNTER — Encounter
Admission: RE | Admit: 2016-06-08 | Discharge: 2016-06-08 | Disposition: A | Discharge status: Home / Self Care | Payer: Medicaid Other | Source: Ambulatory Visit | Attending: Internal Medicine | Admitting: Internal Medicine

## 2016-06-08 DIAGNOSIS — E039 Hypothyroidism, unspecified: Secondary | ICD-10-CM | POA: Diagnosis not present

## 2016-06-08 DIAGNOSIS — E119 Type 2 diabetes mellitus without complications: Secondary | ICD-10-CM | POA: Diagnosis present

## 2016-06-08 LAB — GLUCOSE, CAPILLARY: GLUCOSE-CAPILLARY: 213 mg/dL — AB (ref 65–99)

## 2016-06-09 DIAGNOSIS — E039 Hypothyroidism, unspecified: Secondary | ICD-10-CM | POA: Diagnosis not present

## 2016-06-09 LAB — GLUCOSE, CAPILLARY
Glucose-Capillary: 148 mg/dL — ABNORMAL HIGH (ref 65–99)
Glucose-Capillary: 186 mg/dL — ABNORMAL HIGH (ref 65–99)

## 2016-06-10 DIAGNOSIS — E039 Hypothyroidism, unspecified: Secondary | ICD-10-CM | POA: Diagnosis not present

## 2016-06-10 LAB — GLUCOSE, CAPILLARY
GLUCOSE-CAPILLARY: 130 mg/dL — AB (ref 65–99)
Glucose-Capillary: 142 mg/dL — ABNORMAL HIGH (ref 65–99)

## 2016-06-11 DIAGNOSIS — E039 Hypothyroidism, unspecified: Secondary | ICD-10-CM | POA: Diagnosis not present

## 2016-06-11 LAB — GLUCOSE, CAPILLARY
Glucose-Capillary: 151 mg/dL — ABNORMAL HIGH (ref 65–99)
Glucose-Capillary: 208 mg/dL — ABNORMAL HIGH (ref 65–99)

## 2016-06-12 DIAGNOSIS — E039 Hypothyroidism, unspecified: Secondary | ICD-10-CM | POA: Diagnosis not present

## 2016-06-12 LAB — GLUCOSE, CAPILLARY
GLUCOSE-CAPILLARY: 132 mg/dL — AB (ref 65–99)
Glucose-Capillary: 178 mg/dL — ABNORMAL HIGH (ref 65–99)

## 2016-06-13 DIAGNOSIS — E039 Hypothyroidism, unspecified: Secondary | ICD-10-CM | POA: Diagnosis not present

## 2016-06-13 LAB — GLUCOSE, CAPILLARY
GLUCOSE-CAPILLARY: 181 mg/dL — AB (ref 65–99)
Glucose-Capillary: 154 mg/dL — ABNORMAL HIGH (ref 65–99)

## 2016-06-14 DIAGNOSIS — E039 Hypothyroidism, unspecified: Secondary | ICD-10-CM | POA: Diagnosis not present

## 2016-06-14 LAB — GLUCOSE, CAPILLARY
GLUCOSE-CAPILLARY: 238 mg/dL — AB (ref 65–99)
Glucose-Capillary: 117 mg/dL — ABNORMAL HIGH (ref 65–99)

## 2016-06-15 DIAGNOSIS — E039 Hypothyroidism, unspecified: Secondary | ICD-10-CM | POA: Diagnosis not present

## 2016-06-15 LAB — GLUCOSE, CAPILLARY
GLUCOSE-CAPILLARY: 208 mg/dL — AB (ref 65–99)
Glucose-Capillary: 176 mg/dL — ABNORMAL HIGH (ref 65–99)

## 2016-06-16 DIAGNOSIS — E039 Hypothyroidism, unspecified: Secondary | ICD-10-CM | POA: Diagnosis not present

## 2016-06-16 LAB — GLUCOSE, CAPILLARY
Glucose-Capillary: 150 mg/dL — ABNORMAL HIGH (ref 65–99)
Glucose-Capillary: 193 mg/dL — ABNORMAL HIGH (ref 65–99)

## 2016-06-17 DIAGNOSIS — E039 Hypothyroidism, unspecified: Secondary | ICD-10-CM | POA: Diagnosis not present

## 2016-06-17 LAB — CBC
HEMATOCRIT: 39.1 % (ref 35.0–47.0)
Hemoglobin: 13.4 g/dL (ref 12.0–16.0)
MCH: 32.2 pg (ref 26.0–34.0)
MCHC: 34.4 g/dL (ref 32.0–36.0)
MCV: 93.7 fL (ref 80.0–100.0)
Platelets: 337 10*3/uL (ref 150–440)
RBC: 4.17 MIL/uL (ref 3.80–5.20)
RDW: 12.9 % (ref 11.5–14.5)
WBC: 10 10*3/uL (ref 3.6–11.0)

## 2016-06-17 LAB — TSH: TSH: 3.193 u[IU]/mL (ref 0.350–4.500)

## 2016-06-17 LAB — COMPREHENSIVE METABOLIC PANEL
ALK PHOS: 89 U/L (ref 38–126)
ALT: 22 U/L (ref 14–54)
AST: 21 U/L (ref 15–41)
Albumin: 4 g/dL (ref 3.5–5.0)
Anion gap: 7 (ref 5–15)
BILIRUBIN TOTAL: 0.5 mg/dL (ref 0.3–1.2)
BUN: 23 mg/dL — AB (ref 6–20)
CALCIUM: 9.6 mg/dL (ref 8.9–10.3)
CO2: 29 mmol/L (ref 22–32)
CREATININE: 0.51 mg/dL (ref 0.44–1.00)
Chloride: 101 mmol/L (ref 101–111)
Glucose, Bld: 130 mg/dL — ABNORMAL HIGH (ref 65–99)
Potassium: 3.8 mmol/L (ref 3.5–5.1)
Sodium: 137 mmol/L (ref 135–145)
TOTAL PROTEIN: 7.6 g/dL (ref 6.5–8.1)

## 2016-06-17 LAB — GLUCOSE, CAPILLARY
Glucose-Capillary: 144 mg/dL — ABNORMAL HIGH (ref 65–99)
Glucose-Capillary: 203 mg/dL — ABNORMAL HIGH (ref 65–99)

## 2016-06-18 DIAGNOSIS — E039 Hypothyroidism, unspecified: Secondary | ICD-10-CM | POA: Diagnosis not present

## 2016-06-18 LAB — VITAMIN D 25 HYDROXY (VIT D DEFICIENCY, FRACTURES): VIT D 25 HYDROXY: 34.1 ng/mL (ref 30.0–100.0)

## 2016-06-18 LAB — GLUCOSE, CAPILLARY
GLUCOSE-CAPILLARY: 231 mg/dL — AB (ref 65–99)
Glucose-Capillary: 140 mg/dL — ABNORMAL HIGH (ref 65–99)
Glucose-Capillary: 122 mg/dL — ABNORMAL HIGH (ref 65–99)

## 2016-06-19 DIAGNOSIS — E039 Hypothyroidism, unspecified: Secondary | ICD-10-CM | POA: Diagnosis not present

## 2016-06-19 LAB — GLUCOSE, CAPILLARY
GLUCOSE-CAPILLARY: 149 mg/dL — AB (ref 65–99)
GLUCOSE-CAPILLARY: 224 mg/dL — AB (ref 65–99)

## 2016-06-20 DIAGNOSIS — E039 Hypothyroidism, unspecified: Secondary | ICD-10-CM | POA: Diagnosis not present

## 2016-06-20 LAB — GLUCOSE, CAPILLARY
GLUCOSE-CAPILLARY: 132 mg/dL — AB (ref 65–99)
GLUCOSE-CAPILLARY: 147 mg/dL — AB (ref 65–99)

## 2016-06-21 DIAGNOSIS — E039 Hypothyroidism, unspecified: Secondary | ICD-10-CM | POA: Diagnosis not present

## 2016-06-21 LAB — GLUCOSE, CAPILLARY
GLUCOSE-CAPILLARY: 195 mg/dL — AB (ref 65–99)
Glucose-Capillary: 316 mg/dL — ABNORMAL HIGH (ref 65–99)

## 2016-06-22 DIAGNOSIS — E039 Hypothyroidism, unspecified: Secondary | ICD-10-CM | POA: Diagnosis not present

## 2016-06-22 LAB — GLUCOSE, CAPILLARY
GLUCOSE-CAPILLARY: 227 mg/dL — AB (ref 65–99)
GLUCOSE-CAPILLARY: 207 mg/dL — AB (ref 65–99)
Glucose-Capillary: 177 mg/dL — ABNORMAL HIGH (ref 65–99)

## 2016-06-23 DIAGNOSIS — E039 Hypothyroidism, unspecified: Secondary | ICD-10-CM | POA: Diagnosis not present

## 2016-06-23 LAB — GLUCOSE, CAPILLARY
GLUCOSE-CAPILLARY: 148 mg/dL — AB (ref 65–99)
GLUCOSE-CAPILLARY: 267 mg/dL — AB (ref 65–99)

## 2016-06-24 DIAGNOSIS — E039 Hypothyroidism, unspecified: Secondary | ICD-10-CM | POA: Diagnosis not present

## 2016-06-24 LAB — GLUCOSE, CAPILLARY
GLUCOSE-CAPILLARY: 228 mg/dL — AB (ref 65–99)
Glucose-Capillary: 129 mg/dL — ABNORMAL HIGH (ref 65–99)

## 2016-06-25 DIAGNOSIS — E039 Hypothyroidism, unspecified: Secondary | ICD-10-CM | POA: Diagnosis not present

## 2016-06-25 LAB — GLUCOSE, CAPILLARY
GLUCOSE-CAPILLARY: 189 mg/dL — AB (ref 65–99)
GLUCOSE-CAPILLARY: 221 mg/dL — AB (ref 65–99)

## 2016-06-26 DIAGNOSIS — E039 Hypothyroidism, unspecified: Secondary | ICD-10-CM | POA: Diagnosis not present

## 2016-06-26 LAB — GLUCOSE, CAPILLARY
GLUCOSE-CAPILLARY: 219 mg/dL — AB (ref 65–99)
Glucose-Capillary: 174 mg/dL — ABNORMAL HIGH (ref 65–99)

## 2016-06-27 DIAGNOSIS — E039 Hypothyroidism, unspecified: Secondary | ICD-10-CM | POA: Diagnosis not present

## 2016-06-27 LAB — GLUCOSE, CAPILLARY
GLUCOSE-CAPILLARY: 178 mg/dL — AB (ref 65–99)
GLUCOSE-CAPILLARY: 220 mg/dL — AB (ref 65–99)

## 2016-06-28 DIAGNOSIS — E039 Hypothyroidism, unspecified: Secondary | ICD-10-CM | POA: Diagnosis not present

## 2016-06-28 LAB — GLUCOSE, CAPILLARY
GLUCOSE-CAPILLARY: 166 mg/dL — AB (ref 65–99)
Glucose-Capillary: 207 mg/dL — ABNORMAL HIGH (ref 65–99)

## 2016-06-29 DIAGNOSIS — E039 Hypothyroidism, unspecified: Secondary | ICD-10-CM | POA: Diagnosis not present

## 2016-06-29 LAB — GLUCOSE, CAPILLARY
GLUCOSE-CAPILLARY: 156 mg/dL — AB (ref 65–99)
Glucose-Capillary: 154 mg/dL — ABNORMAL HIGH (ref 65–99)

## 2016-06-30 DIAGNOSIS — E039 Hypothyroidism, unspecified: Secondary | ICD-10-CM | POA: Diagnosis not present

## 2016-06-30 LAB — GLUCOSE, CAPILLARY
GLUCOSE-CAPILLARY: 206 mg/dL — AB (ref 65–99)
Glucose-Capillary: 144 mg/dL — ABNORMAL HIGH (ref 65–99)

## 2016-07-01 DIAGNOSIS — E039 Hypothyroidism, unspecified: Secondary | ICD-10-CM | POA: Diagnosis not present

## 2016-07-01 LAB — GLUCOSE, CAPILLARY
Glucose-Capillary: 169 mg/dL — ABNORMAL HIGH (ref 65–99)
Glucose-Capillary: 281 mg/dL — ABNORMAL HIGH (ref 65–99)

## 2016-07-02 DIAGNOSIS — E039 Hypothyroidism, unspecified: Secondary | ICD-10-CM | POA: Diagnosis not present

## 2016-07-02 LAB — GLUCOSE, CAPILLARY
Glucose-Capillary: 133 mg/dL — ABNORMAL HIGH (ref 65–99)
Glucose-Capillary: 142 mg/dL — ABNORMAL HIGH (ref 65–99)

## 2016-07-03 DIAGNOSIS — E039 Hypothyroidism, unspecified: Secondary | ICD-10-CM | POA: Diagnosis not present

## 2016-07-03 LAB — GLUCOSE, CAPILLARY
GLUCOSE-CAPILLARY: 116 mg/dL — AB (ref 65–99)
GLUCOSE-CAPILLARY: 223 mg/dL — AB (ref 65–99)

## 2016-07-04 DIAGNOSIS — E039 Hypothyroidism, unspecified: Secondary | ICD-10-CM | POA: Diagnosis not present

## 2016-07-04 LAB — GLUCOSE, CAPILLARY
Glucose-Capillary: 153 mg/dL — ABNORMAL HIGH (ref 65–99)
Glucose-Capillary: 164 mg/dL — ABNORMAL HIGH (ref 65–99)

## 2016-07-05 DIAGNOSIS — E039 Hypothyroidism, unspecified: Secondary | ICD-10-CM | POA: Diagnosis not present

## 2016-07-05 LAB — GLUCOSE, CAPILLARY
GLUCOSE-CAPILLARY: 127 mg/dL — AB (ref 65–99)
Glucose-Capillary: 159 mg/dL — ABNORMAL HIGH (ref 65–99)

## 2016-07-06 DIAGNOSIS — E039 Hypothyroidism, unspecified: Secondary | ICD-10-CM | POA: Diagnosis not present

## 2016-07-06 LAB — URINALYSIS COMPLETE WITH MICROSCOPIC (ARMC ONLY)
BILIRUBIN URINE: NEGATIVE
Glucose, UA: NEGATIVE mg/dL
KETONES UR: NEGATIVE mg/dL
Leukocytes, UA: NEGATIVE
Nitrite: NEGATIVE
PH: 5 (ref 5.0–8.0)
PROTEIN: 30 mg/dL — AB
Specific Gravity, Urine: 1.015 (ref 1.005–1.030)

## 2016-07-06 LAB — GLUCOSE, CAPILLARY
GLUCOSE-CAPILLARY: 157 mg/dL — AB (ref 65–99)
Glucose-Capillary: 154 mg/dL — ABNORMAL HIGH (ref 65–99)

## 2016-07-07 DIAGNOSIS — E039 Hypothyroidism, unspecified: Secondary | ICD-10-CM | POA: Diagnosis not present

## 2016-07-07 LAB — GLUCOSE, CAPILLARY: GLUCOSE-CAPILLARY: 150 mg/dL — AB (ref 65–99)

## 2016-07-07 LAB — URINE CULTURE: Culture: <10000 — AB

## 2016-07-08 ENCOUNTER — Encounter
Admission: RE | Admit: 2016-07-08 | Discharge: 2016-07-08 | Disposition: A | Discharge status: Home / Self Care | Payer: Medicaid Other | Source: Ambulatory Visit | Attending: Internal Medicine | Admitting: Internal Medicine

## 2016-07-08 DIAGNOSIS — E1165 Type 2 diabetes mellitus with hyperglycemia: Secondary | ICD-10-CM | POA: Diagnosis not present

## 2016-07-08 LAB — GLUCOSE, CAPILLARY
GLUCOSE-CAPILLARY: 180 mg/dL — AB (ref 65–99)
Glucose-Capillary: 119 mg/dL — ABNORMAL HIGH (ref 65–99)

## 2016-07-30 DIAGNOSIS — E1165 Type 2 diabetes mellitus with hyperglycemia: Secondary | ICD-10-CM | POA: Diagnosis not present

## 2016-07-30 LAB — GLUCOSE, CAPILLARY
GLUCOSE-CAPILLARY: 162 mg/dL — AB (ref 65–99)
GLUCOSE-CAPILLARY: 244 mg/dL — AB (ref 65–99)

## 2016-07-31 DIAGNOSIS — E1165 Type 2 diabetes mellitus with hyperglycemia: Secondary | ICD-10-CM | POA: Diagnosis not present

## 2016-07-31 LAB — GLUCOSE, CAPILLARY
Glucose-Capillary: 135 mg/dL — ABNORMAL HIGH (ref 65–99)
Glucose-Capillary: 237 mg/dL — ABNORMAL HIGH (ref 65–99)

## 2016-08-01 DIAGNOSIS — E1165 Type 2 diabetes mellitus with hyperglycemia: Secondary | ICD-10-CM | POA: Diagnosis not present

## 2016-08-01 LAB — GLUCOSE, CAPILLARY
GLUCOSE-CAPILLARY: 231 mg/dL — AB (ref 65–99)
GLUCOSE-CAPILLARY: 202 mg/dL — AB (ref 65–99)

## 2016-08-02 DIAGNOSIS — E1165 Type 2 diabetes mellitus with hyperglycemia: Secondary | ICD-10-CM | POA: Diagnosis not present

## 2016-08-02 LAB — GLUCOSE, CAPILLARY
GLUCOSE-CAPILLARY: 152 mg/dL — AB (ref 65–99)
Glucose-Capillary: 209 mg/dL — ABNORMAL HIGH (ref 65–99)

## 2016-08-03 DIAGNOSIS — E1165 Type 2 diabetes mellitus with hyperglycemia: Secondary | ICD-10-CM | POA: Diagnosis not present

## 2016-08-03 LAB — GLUCOSE, CAPILLARY
Glucose-Capillary: 145 mg/dL — ABNORMAL HIGH (ref 65–99)
Glucose-Capillary: 243 mg/dL — ABNORMAL HIGH (ref 65–99)

## 2016-08-04 DIAGNOSIS — E1165 Type 2 diabetes mellitus with hyperglycemia: Secondary | ICD-10-CM | POA: Diagnosis not present

## 2016-08-04 LAB — GLUCOSE, CAPILLARY
Glucose-Capillary: 142 mg/dL — ABNORMAL HIGH (ref 65–99)
Glucose-Capillary: 312 mg/dL — ABNORMAL HIGH (ref 65–99)

## 2016-08-05 DIAGNOSIS — E1165 Type 2 diabetes mellitus with hyperglycemia: Secondary | ICD-10-CM | POA: Diagnosis not present

## 2016-08-05 LAB — GLUCOSE, CAPILLARY
GLUCOSE-CAPILLARY: 113 mg/dL — AB (ref 65–99)
GLUCOSE-CAPILLARY: 177 mg/dL — AB (ref 65–99)

## 2016-08-06 DIAGNOSIS — E1165 Type 2 diabetes mellitus with hyperglycemia: Secondary | ICD-10-CM | POA: Diagnosis not present

## 2016-08-06 LAB — GLUCOSE, CAPILLARY
GLUCOSE-CAPILLARY: 125 mg/dL — AB (ref 65–99)
GLUCOSE-CAPILLARY: 199 mg/dL — AB (ref 65–99)

## 2016-08-07 DIAGNOSIS — E1165 Type 2 diabetes mellitus with hyperglycemia: Secondary | ICD-10-CM | POA: Diagnosis not present

## 2016-08-07 LAB — GLUCOSE, CAPILLARY
GLUCOSE-CAPILLARY: 137 mg/dL — AB (ref 65–99)
GLUCOSE-CAPILLARY: 222 mg/dL — AB (ref 65–99)

## 2016-08-08 ENCOUNTER — Encounter
Admission: RE | Admit: 2016-08-08 | Discharge: 2016-08-08 | Disposition: A | Discharge status: Home / Self Care | Payer: Medicaid Other | Source: Ambulatory Visit | Attending: Internal Medicine | Admitting: Internal Medicine

## 2016-08-08 DIAGNOSIS — E1165 Type 2 diabetes mellitus with hyperglycemia: Secondary | ICD-10-CM | POA: Diagnosis present

## 2016-08-08 LAB — GLUCOSE, CAPILLARY: GLUCOSE-CAPILLARY: 243 mg/dL — AB (ref 65–99)

## 2016-08-09 DIAGNOSIS — E1165 Type 2 diabetes mellitus with hyperglycemia: Secondary | ICD-10-CM | POA: Diagnosis not present

## 2016-08-09 LAB — GLUCOSE, CAPILLARY
Glucose-Capillary: 126 mg/dL — ABNORMAL HIGH (ref 65–99)
Glucose-Capillary: 279 mg/dL — ABNORMAL HIGH (ref 65–99)

## 2016-08-10 DIAGNOSIS — E1165 Type 2 diabetes mellitus with hyperglycemia: Secondary | ICD-10-CM | POA: Diagnosis not present

## 2016-08-10 LAB — GLUCOSE, CAPILLARY
Glucose-Capillary: 102 mg/dL — ABNORMAL HIGH (ref 65–99)
Glucose-Capillary: 199 mg/dL — ABNORMAL HIGH (ref 65–99)

## 2016-08-11 DIAGNOSIS — E1165 Type 2 diabetes mellitus with hyperglycemia: Secondary | ICD-10-CM | POA: Diagnosis not present

## 2016-08-11 LAB — GLUCOSE, CAPILLARY
GLUCOSE-CAPILLARY: 104 mg/dL — AB (ref 65–99)
Glucose-Capillary: 204 mg/dL — ABNORMAL HIGH (ref 65–99)

## 2016-08-12 DIAGNOSIS — E1165 Type 2 diabetes mellitus with hyperglycemia: Secondary | ICD-10-CM | POA: Diagnosis not present

## 2016-08-12 LAB — GLUCOSE, CAPILLARY
Glucose-Capillary: 118 mg/dL — ABNORMAL HIGH (ref 65–99)
Glucose-Capillary: 218 mg/dL — ABNORMAL HIGH (ref 65–99)

## 2016-08-13 DIAGNOSIS — E1165 Type 2 diabetes mellitus with hyperglycemia: Secondary | ICD-10-CM | POA: Diagnosis not present

## 2016-08-13 LAB — GLUCOSE, CAPILLARY: Glucose-Capillary: 256 mg/dL — ABNORMAL HIGH (ref 65–99)

## 2016-08-14 DIAGNOSIS — E1165 Type 2 diabetes mellitus with hyperglycemia: Secondary | ICD-10-CM | POA: Diagnosis not present

## 2016-08-14 LAB — GLUCOSE, CAPILLARY
GLUCOSE-CAPILLARY: 147 mg/dL — AB (ref 65–99)
GLUCOSE-CAPILLARY: 184 mg/dL — AB (ref 65–99)

## 2016-08-15 DIAGNOSIS — E1165 Type 2 diabetes mellitus with hyperglycemia: Secondary | ICD-10-CM | POA: Diagnosis not present

## 2016-08-15 LAB — GLUCOSE, CAPILLARY
Glucose-Capillary: 198 mg/dL — ABNORMAL HIGH (ref 65–99)
Glucose-Capillary: 198 mg/dL — ABNORMAL HIGH (ref 65–99)

## 2016-08-16 DIAGNOSIS — E1165 Type 2 diabetes mellitus with hyperglycemia: Secondary | ICD-10-CM | POA: Diagnosis not present

## 2016-08-16 LAB — GLUCOSE, CAPILLARY
Glucose-Capillary: 192 mg/dL — ABNORMAL HIGH (ref 65–99)
Glucose-Capillary: 168 mg/dL — ABNORMAL HIGH (ref 65–99)

## 2016-08-17 DIAGNOSIS — E1165 Type 2 diabetes mellitus with hyperglycemia: Secondary | ICD-10-CM | POA: Diagnosis not present

## 2016-08-17 LAB — GLUCOSE, CAPILLARY
Glucose-Capillary: 122 mg/dL — ABNORMAL HIGH (ref 65–99)
Glucose-Capillary: 205 mg/dL — ABNORMAL HIGH (ref 65–99)

## 2016-08-18 DIAGNOSIS — E1165 Type 2 diabetes mellitus with hyperglycemia: Secondary | ICD-10-CM | POA: Diagnosis not present

## 2016-08-18 LAB — GLUCOSE, CAPILLARY
Glucose-Capillary: 127 mg/dL — ABNORMAL HIGH (ref 65–99)
Glucose-Capillary: 257 mg/dL — ABNORMAL HIGH (ref 65–99)

## 2016-08-19 DIAGNOSIS — E1165 Type 2 diabetes mellitus with hyperglycemia: Secondary | ICD-10-CM | POA: Diagnosis not present

## 2016-08-19 LAB — GLUCOSE, CAPILLARY
GLUCOSE-CAPILLARY: 175 mg/dL — AB (ref 65–99)
Glucose-Capillary: 190 mg/dL — ABNORMAL HIGH (ref 65–99)

## 2016-08-20 DIAGNOSIS — E1165 Type 2 diabetes mellitus with hyperglycemia: Secondary | ICD-10-CM | POA: Diagnosis not present

## 2016-08-20 LAB — GLUCOSE, CAPILLARY
GLUCOSE-CAPILLARY: 109 mg/dL — AB (ref 65–99)
GLUCOSE-CAPILLARY: 185 mg/dL — AB (ref 65–99)

## 2016-08-21 DIAGNOSIS — E1165 Type 2 diabetes mellitus with hyperglycemia: Secondary | ICD-10-CM | POA: Diagnosis not present

## 2016-08-21 LAB — GLUCOSE, CAPILLARY
GLUCOSE-CAPILLARY: 115 mg/dL — AB (ref 65–99)
Glucose-Capillary: 207 mg/dL — ABNORMAL HIGH (ref 65–99)

## 2016-08-22 DIAGNOSIS — E1165 Type 2 diabetes mellitus with hyperglycemia: Secondary | ICD-10-CM | POA: Diagnosis not present

## 2016-08-22 LAB — GLUCOSE, CAPILLARY: Glucose-Capillary: 157 mg/dL — ABNORMAL HIGH (ref 65–99)

## 2016-08-23 DIAGNOSIS — E1165 Type 2 diabetes mellitus with hyperglycemia: Secondary | ICD-10-CM | POA: Diagnosis not present

## 2016-08-23 LAB — GLUCOSE, CAPILLARY
Glucose-Capillary: 114 mg/dL — ABNORMAL HIGH (ref 65–99)
Glucose-Capillary: 160 mg/dL — ABNORMAL HIGH (ref 65–99)

## 2016-08-24 DIAGNOSIS — E1165 Type 2 diabetes mellitus with hyperglycemia: Secondary | ICD-10-CM | POA: Diagnosis not present

## 2016-08-24 LAB — GLUCOSE, CAPILLARY: Glucose-Capillary: 120 mg/dL — ABNORMAL HIGH (ref 65–99)

## 2016-08-25 IMAGING — DX DG PORTABLE PELVIS
2 series · 2 of 2 positions shown · non-contrast
Comparison: 04/01/2015

CLINICAL DATA: Chronic sacrococcygeal pain, with pressure sores.
Recent bilateral lower extremity weakness.

EXAM:
PORTABLE PELVIS 1-2 VIEWS

[pelvis ap (1 of 2)]
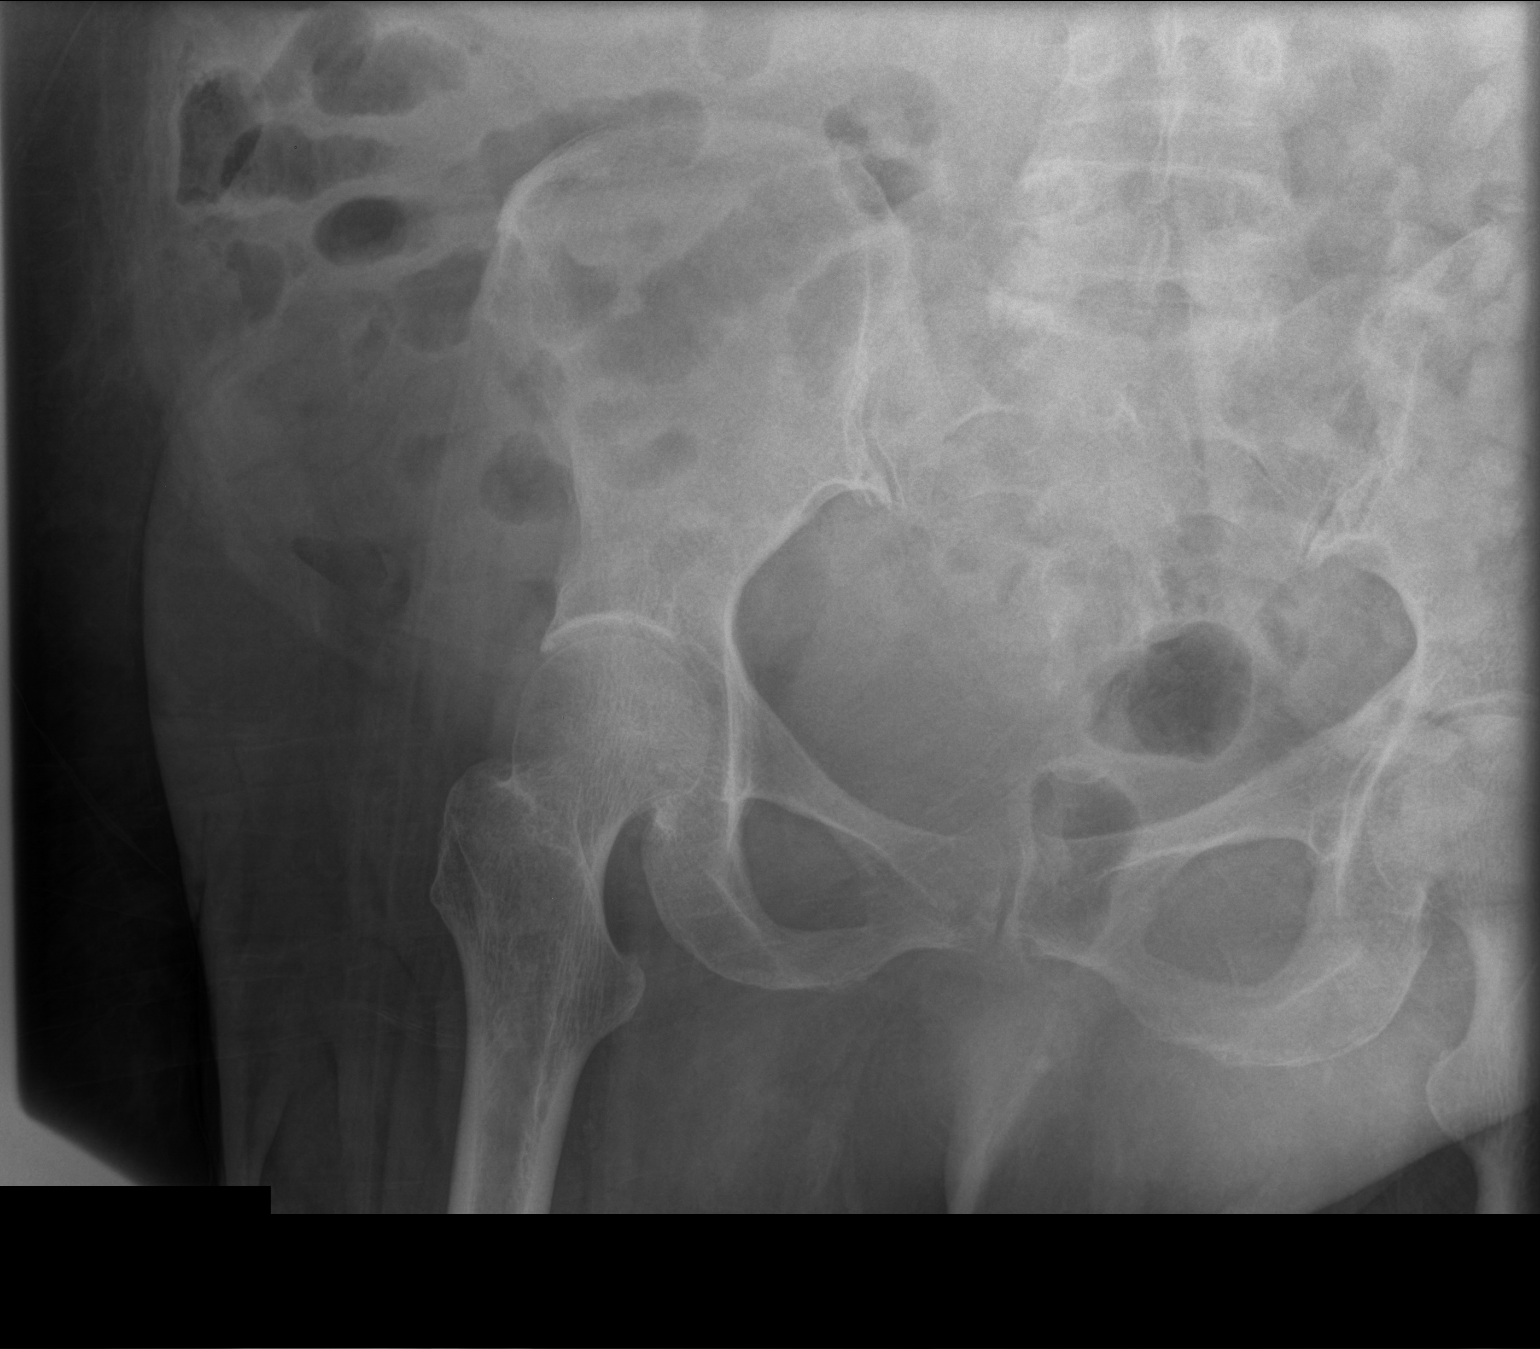

[pelvis ap (2 of 2)]
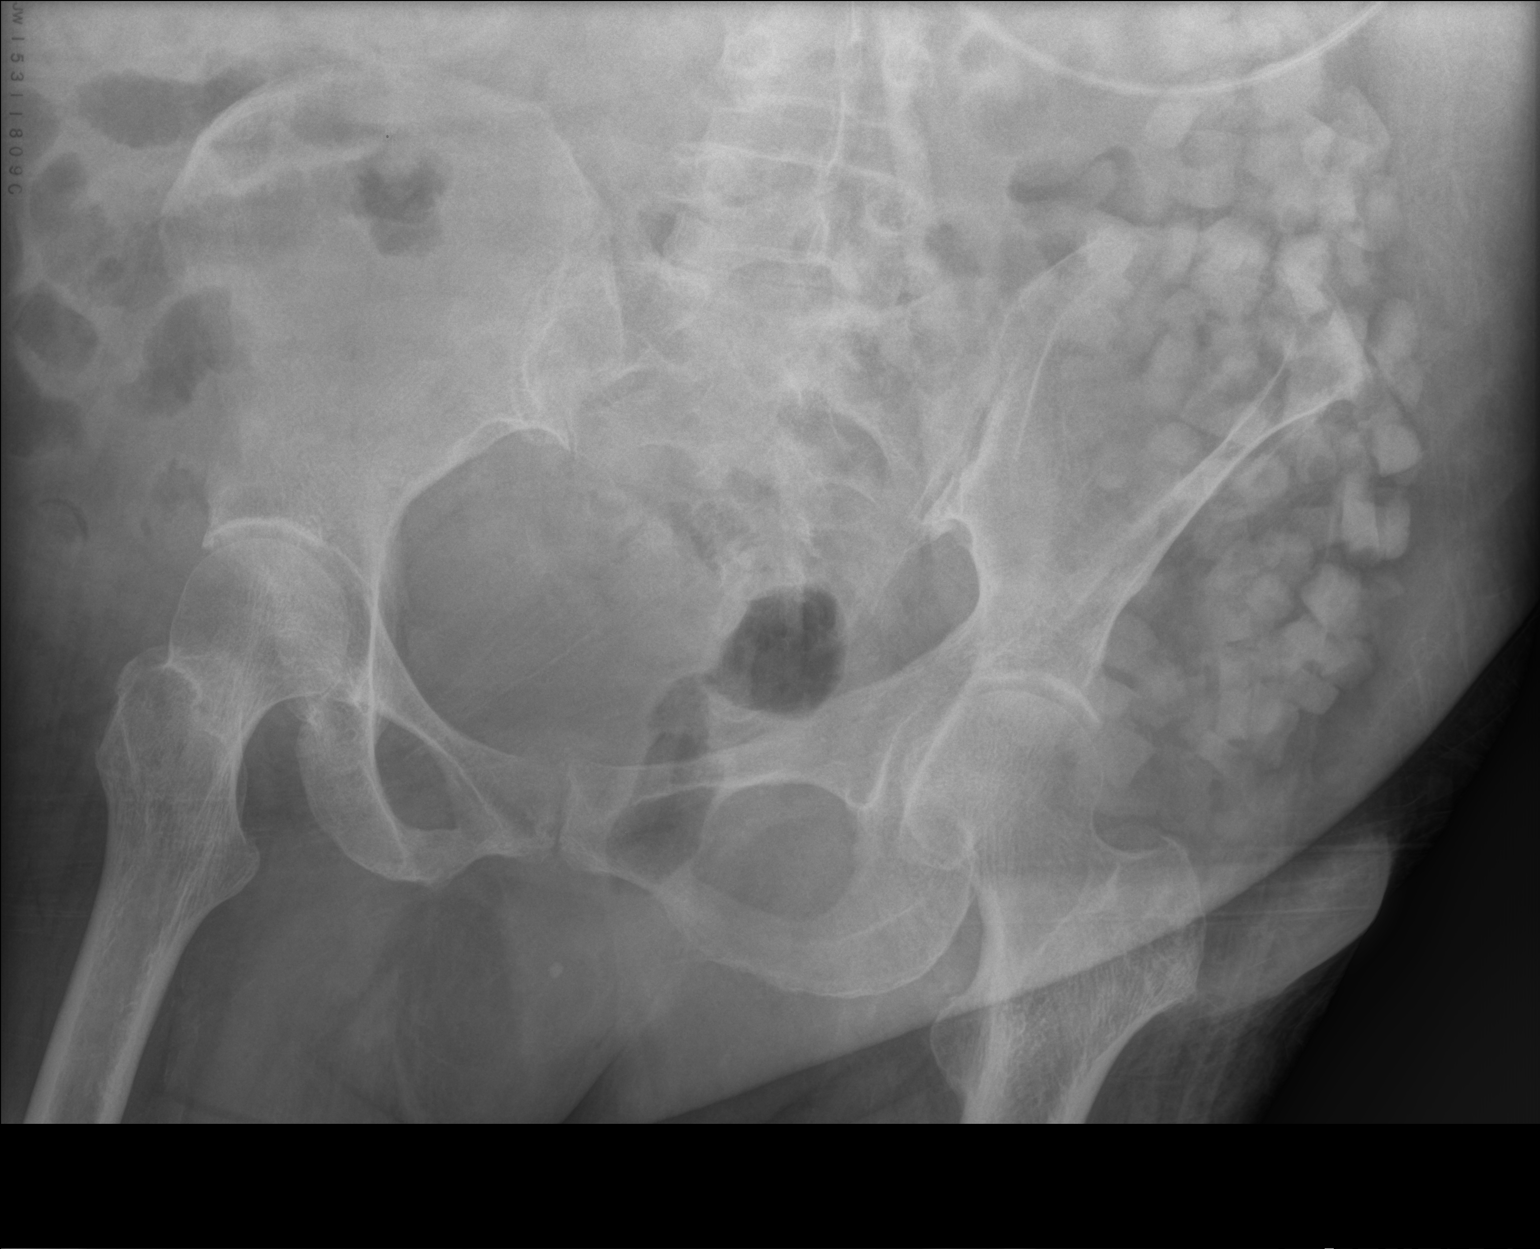

[2 of 2 positions shown; findings below may reference images not displayed]

FINDINGS: Negative for fracture or dislocation about either hip. The pubic
symphysis and sacroiliac joints appear grossly intact. There is no
bone lesion or bony destruction.
IMPRESSION: Negative for acute fracture.

## 2016-08-26 DIAGNOSIS — E1165 Type 2 diabetes mellitus with hyperglycemia: Secondary | ICD-10-CM | POA: Diagnosis not present

## 2016-08-26 LAB — GLUCOSE, CAPILLARY
GLUCOSE-CAPILLARY: 221 mg/dL — AB (ref 65–99)
Glucose-Capillary: 151 mg/dL — ABNORMAL HIGH (ref 65–99)

## 2016-08-27 DIAGNOSIS — E1165 Type 2 diabetes mellitus with hyperglycemia: Secondary | ICD-10-CM | POA: Diagnosis not present

## 2016-08-27 LAB — GLUCOSE, CAPILLARY
Glucose-Capillary: 130 mg/dL — ABNORMAL HIGH (ref 65–99)
Glucose-Capillary: 166 mg/dL — ABNORMAL HIGH (ref 65–99)

## 2016-08-28 DIAGNOSIS — E1165 Type 2 diabetes mellitus with hyperglycemia: Secondary | ICD-10-CM | POA: Diagnosis not present

## 2016-08-28 LAB — GLUCOSE, CAPILLARY
Glucose-Capillary: 139 mg/dL — ABNORMAL HIGH (ref 65–99)
Glucose-Capillary: 251 mg/dL — ABNORMAL HIGH (ref 65–99)

## 2016-08-29 DIAGNOSIS — E1165 Type 2 diabetes mellitus with hyperglycemia: Secondary | ICD-10-CM | POA: Diagnosis not present

## 2016-08-29 LAB — GLUCOSE, CAPILLARY
GLUCOSE-CAPILLARY: 194 mg/dL — AB (ref 65–99)
Glucose-Capillary: 165 mg/dL — ABNORMAL HIGH (ref 65–99)

## 2016-08-30 DIAGNOSIS — E1165 Type 2 diabetes mellitus with hyperglycemia: Secondary | ICD-10-CM | POA: Diagnosis not present

## 2016-08-30 LAB — GLUCOSE, CAPILLARY
GLUCOSE-CAPILLARY: 175 mg/dL — AB (ref 65–99)
Glucose-Capillary: 187 mg/dL — ABNORMAL HIGH (ref 65–99)

## 2016-08-31 DIAGNOSIS — E1165 Type 2 diabetes mellitus with hyperglycemia: Secondary | ICD-10-CM | POA: Diagnosis not present

## 2016-08-31 LAB — GLUCOSE, CAPILLARY
Glucose-Capillary: 162 mg/dL — ABNORMAL HIGH (ref 65–99)
Glucose-Capillary: 172 mg/dL — ABNORMAL HIGH (ref 65–99)

## 2016-09-01 DIAGNOSIS — E1165 Type 2 diabetes mellitus with hyperglycemia: Secondary | ICD-10-CM | POA: Diagnosis not present

## 2016-09-01 LAB — GLUCOSE, CAPILLARY
Glucose-Capillary: 71 mg/dL (ref 65–99)
Glucose-Capillary: 152 mg/dL — ABNORMAL HIGH (ref 65–99)

## 2016-09-02 DIAGNOSIS — E1165 Type 2 diabetes mellitus with hyperglycemia: Secondary | ICD-10-CM | POA: Diagnosis not present

## 2016-09-02 LAB — GLUCOSE, CAPILLARY
GLUCOSE-CAPILLARY: 101 mg/dL — AB (ref 65–99)
GLUCOSE-CAPILLARY: 176 mg/dL — AB (ref 65–99)

## 2016-09-03 DIAGNOSIS — E1165 Type 2 diabetes mellitus with hyperglycemia: Secondary | ICD-10-CM | POA: Diagnosis not present

## 2016-09-03 LAB — GLUCOSE, CAPILLARY
GLUCOSE-CAPILLARY: 110 mg/dL — AB (ref 65–99)
Glucose-Capillary: 146 mg/dL — ABNORMAL HIGH (ref 65–99)

## 2016-09-04 DIAGNOSIS — E1165 Type 2 diabetes mellitus with hyperglycemia: Secondary | ICD-10-CM | POA: Diagnosis not present

## 2016-09-04 LAB — GLUCOSE, CAPILLARY
GLUCOSE-CAPILLARY: 91 mg/dL (ref 65–99)
GLUCOSE-CAPILLARY: 216 mg/dL — AB (ref 65–99)

## 2016-09-05 DIAGNOSIS — E1165 Type 2 diabetes mellitus with hyperglycemia: Secondary | ICD-10-CM | POA: Diagnosis not present

## 2016-09-05 LAB — GLUCOSE, CAPILLARY
Glucose-Capillary: 139 mg/dL — ABNORMAL HIGH (ref 65–99)
Glucose-Capillary: 185 mg/dL — ABNORMAL HIGH (ref 65–99)

## 2016-09-06 DIAGNOSIS — E1165 Type 2 diabetes mellitus with hyperglycemia: Secondary | ICD-10-CM | POA: Diagnosis not present

## 2016-09-06 LAB — GLUCOSE, CAPILLARY
GLUCOSE-CAPILLARY: 166 mg/dL — AB (ref 65–99)
GLUCOSE-CAPILLARY: 186 mg/dL — AB (ref 65–99)

## 2016-09-07 DIAGNOSIS — E1165 Type 2 diabetes mellitus with hyperglycemia: Secondary | ICD-10-CM | POA: Diagnosis not present

## 2016-09-07 LAB — GLUCOSE, CAPILLARY
GLUCOSE-CAPILLARY: 154 mg/dL — AB (ref 65–99)
Glucose-Capillary: 94 mg/dL (ref 65–99)

## 2016-09-08 ENCOUNTER — Encounter
Admission: RE | Admit: 2016-09-08 | Discharge: 2016-09-08 | Disposition: A | Discharge status: Home / Self Care | Payer: Medicaid Other | Source: Ambulatory Visit | Attending: Internal Medicine | Admitting: Internal Medicine

## 2016-09-08 DIAGNOSIS — E119 Type 2 diabetes mellitus without complications: Secondary | ICD-10-CM | POA: Diagnosis not present

## 2016-09-08 LAB — GLUCOSE, CAPILLARY: GLUCOSE-CAPILLARY: 103 mg/dL — AB (ref 65–99)

## 2016-09-08 IMAGING — CT CT PELVIS W/O CM
2 of 3 series · 12 of 32 positions shown, 18 images · non-contrast
Comparison: Plain film 04/05/2015, 04/01/2015

CLINICAL DATA: 64-year-old female with a history of left hip and
pelvic pain since [REDACTED]. History of 2 falls.

EXAM:
CT PELVIS WITHOUT CONTRAST
TECHNIQUE: Multidetector CT imaging of the pelvis was performed following the
standard protocol without intravenous contrast.

[Series 5: soft tissue axials · axial · 0.70mm/px · z∈[-505,-303]mm · 10 of 125 slices shown, 16 images]
[im 12/125  soft-tissue]
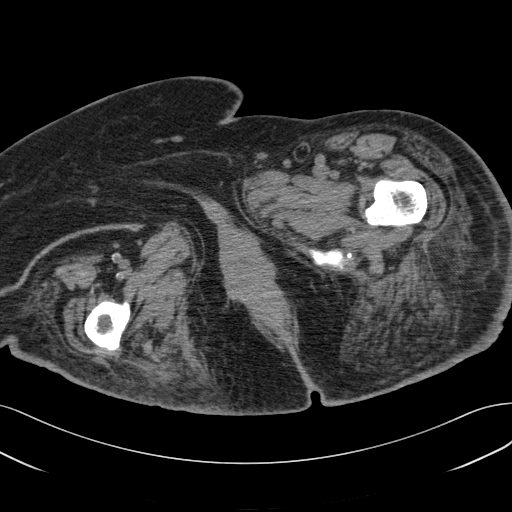
[im 12/125  bone]
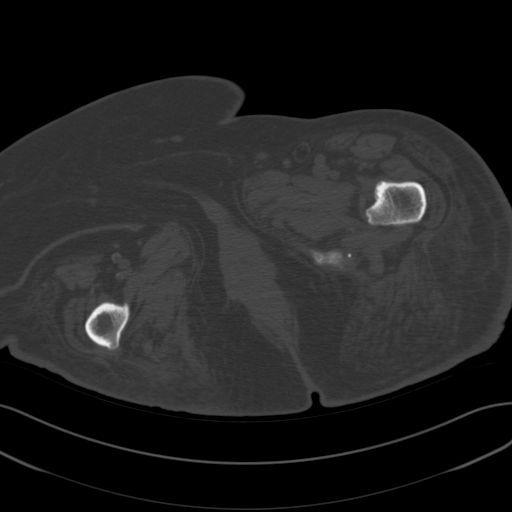
[im 23/125  soft-tissue]
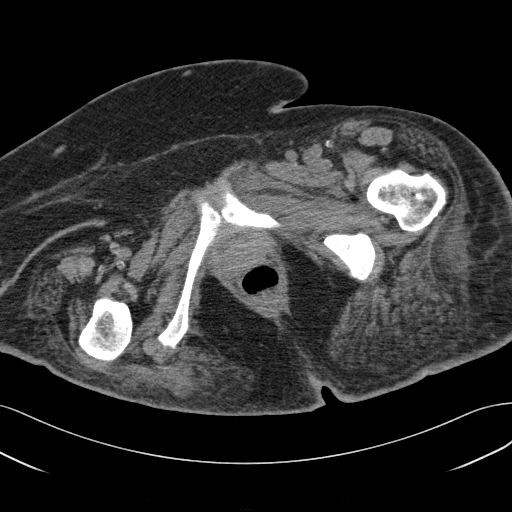
[im 34/125  soft-tissue]
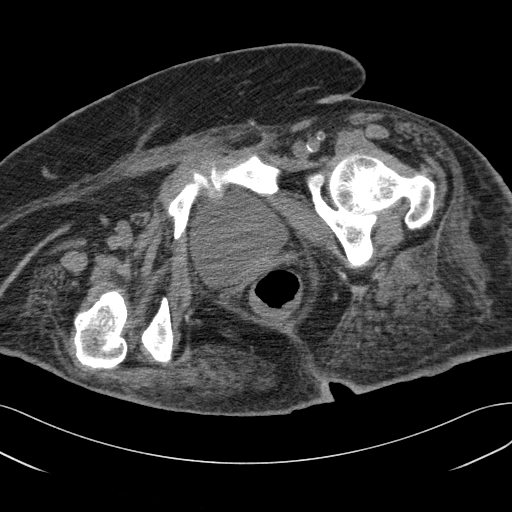
[im 46/125  soft-tissue]
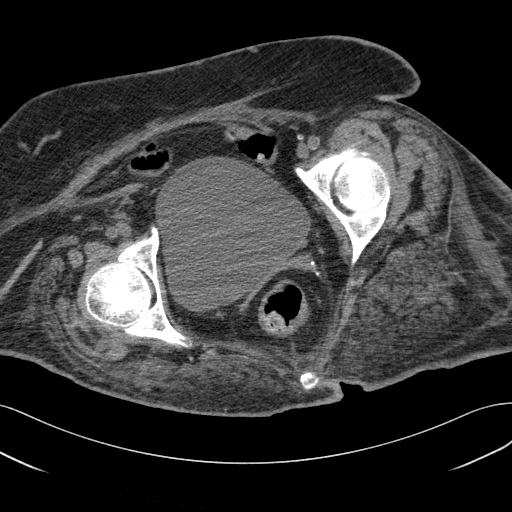
[im 57/125  soft-tissue]
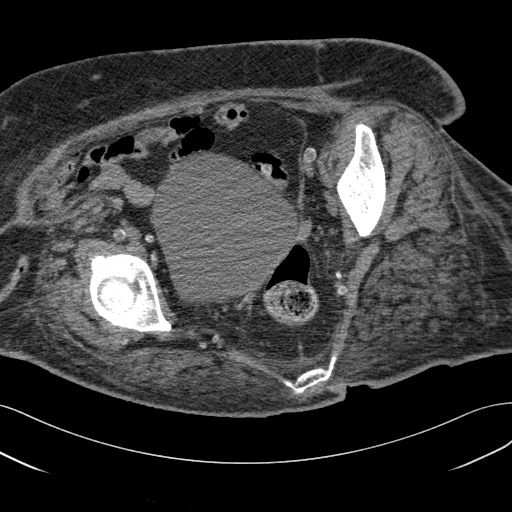
[im 68/125  soft-tissue]
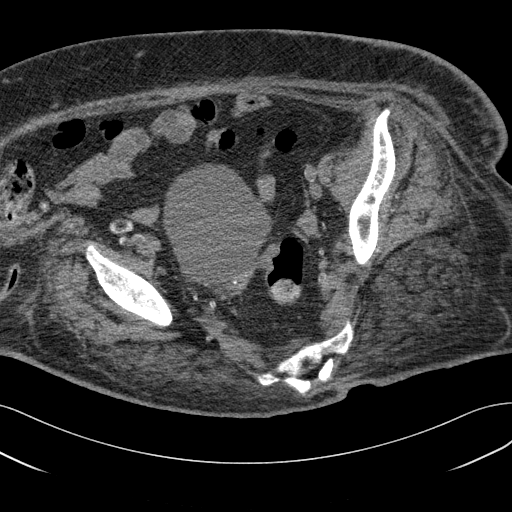
[im 79/125  soft-tissue]
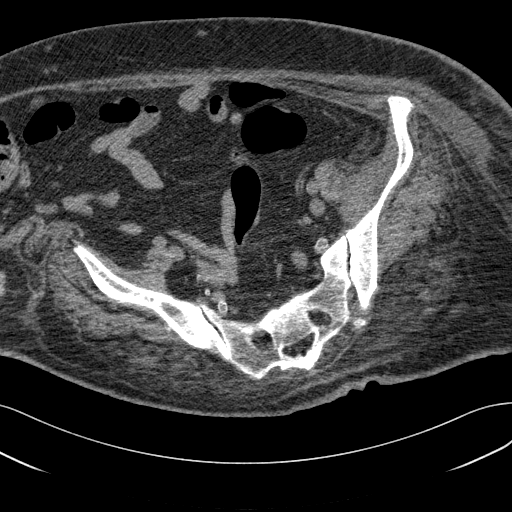
[im 79/125  lung]
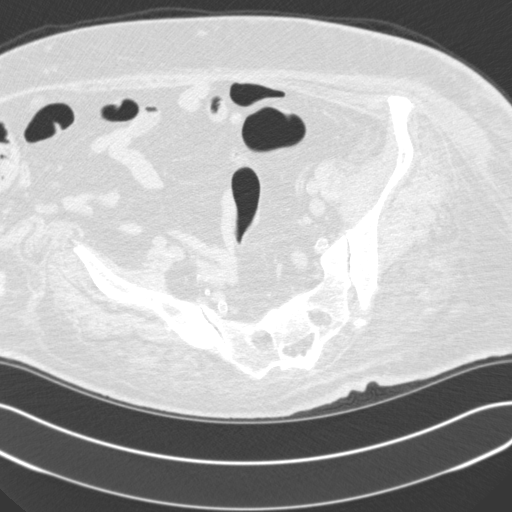
[im 91/125  soft-tissue]
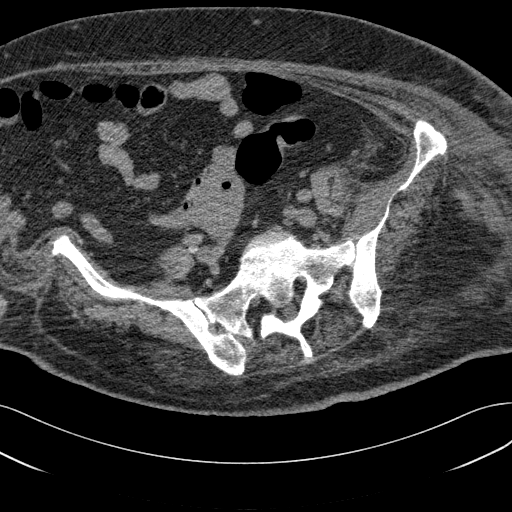
[im 91/125  lung]
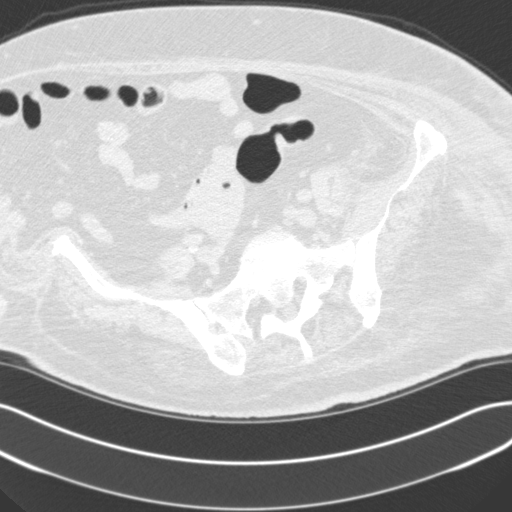
[im 102/125  soft-tissue]
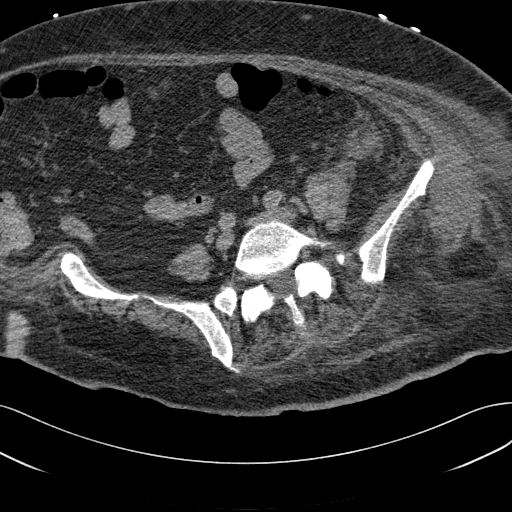
[im 102/125  lung]
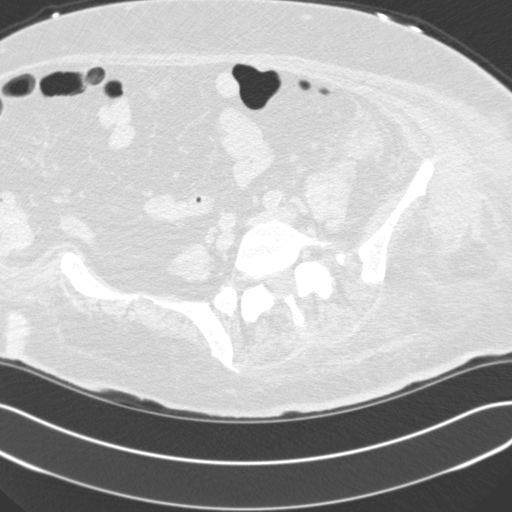
[im 102/125  bone]
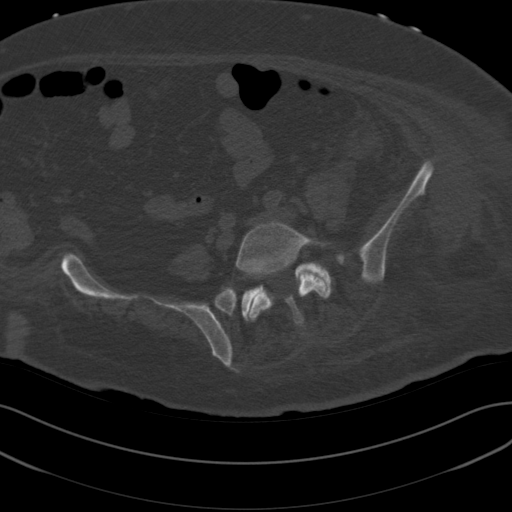
[im 113/125  soft-tissue]
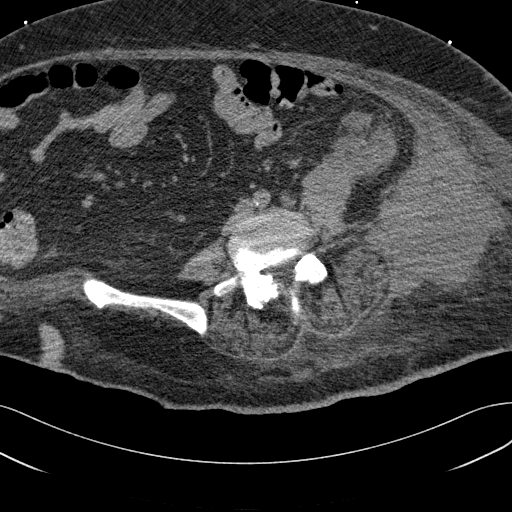
[im 113/125  lung]
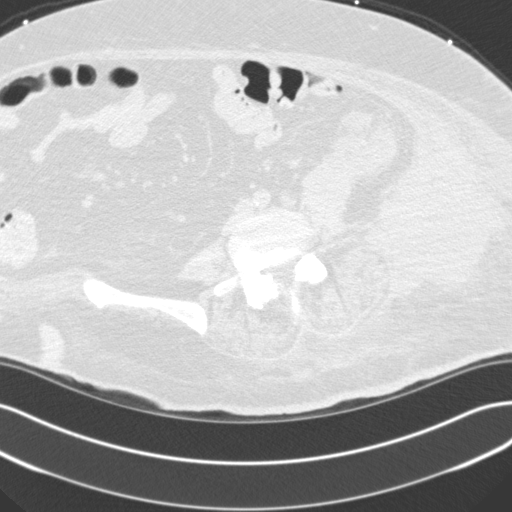

[Series 602: axial bone · axial · 0.70mm/px · z∈[-473,-447]mm · 2 of 114 slices shown]
[im 13/114  bone]
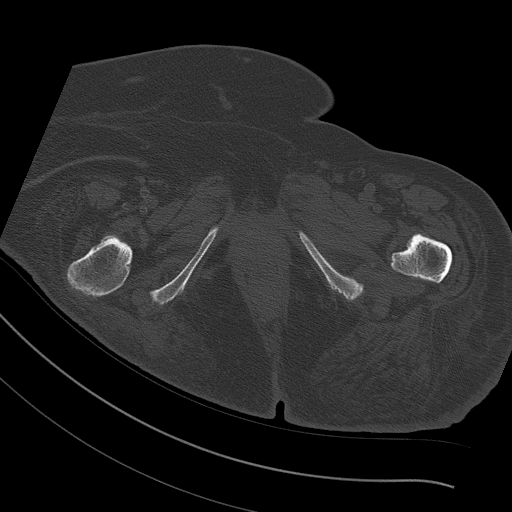
[im 26/114  bone]
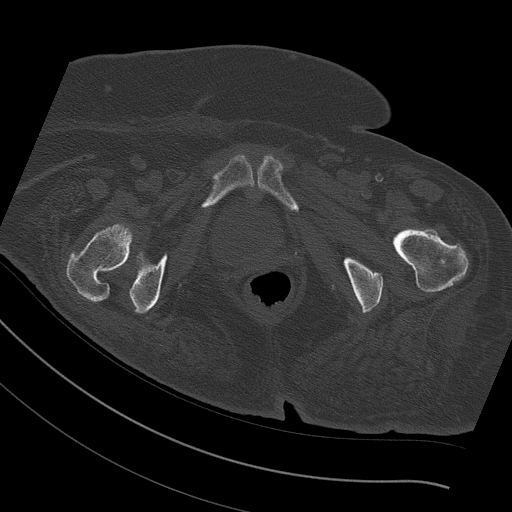

[12 of 32 positions shown; findings below may reference images not displayed]

FINDINGS: Musculoskeletal:

Mild degenerative disc disease. No evidence of bony narrowing at the
L4-S1 level.

No sacral fracture identified.

Bony pelvis intact without acute fracture. Mild degenerative changes
at the bilateral sacroiliac joints.

Unremarkable appearance of the right hip, which is congruent.
Minimal joint space narrowing. No subchondral sclerosis or cyst
formation. Unremarkable appearance of the proximal right femur.

No evidence of left hip fracture. Unremarkable appearance of the
proximal left femur. Mild joint space narrowing without subchondral
sclerosis or cyst formation. Left hip is congruent.

Ill-defined intermediate density soft tissue involving the abdominal
wall musculature superior to the left iliac bone. Soft tissue
dimension measures 9.1 cm by 10.0 cm on axial imaging. There is
disruption of the normal architecture of the abdominal wall
musculature at this site. Soft tissue extends into the
retroperitoneum with ill-defined margins of the left psoas muscle
and retroperitoneal fat. Superior extent not entirely imaged.

Intra-abdominal/pelvic:

Unremarkable appearance of visualized bowel.

Atherosclerotic calcifications of the vasculature.

Unremarkable appearance of the urinary bladder.

Surgical changes of hysterectomy.
IMPRESSION: No acute fracture identified, specifically involving the left hip.

Mild osteoarthritis of the bilateral hips.

Abnormal soft tissue disrupting the posterior abdominal wall
musculature extending caudally from the left iliac bone,
incompletely imaged. Differential diagnosis includes hematoma, or
alternatively soft tissue mass there also appears to be involvement
of the left psoas musculature and retroperitoneum, again,
incompletely imaged. Further evaluation with a contrast-enhanced
abdomen/pelvis CT is recommend.

## 2016-09-09 DIAGNOSIS — E119 Type 2 diabetes mellitus without complications: Secondary | ICD-10-CM | POA: Diagnosis not present

## 2016-09-09 LAB — GLUCOSE, CAPILLARY
Glucose-Capillary: 88 mg/dL (ref 65–99)
Glucose-Capillary: 193 mg/dL — ABNORMAL HIGH (ref 65–99)

## 2016-09-10 DIAGNOSIS — E119 Type 2 diabetes mellitus without complications: Secondary | ICD-10-CM | POA: Diagnosis not present

## 2016-09-10 LAB — GLUCOSE, CAPILLARY
GLUCOSE-CAPILLARY: 94 mg/dL (ref 65–99)
GLUCOSE-CAPILLARY: 147 mg/dL — AB (ref 65–99)

## 2016-09-11 DIAGNOSIS — E119 Type 2 diabetes mellitus without complications: Secondary | ICD-10-CM | POA: Diagnosis not present

## 2016-09-11 LAB — GLUCOSE, CAPILLARY
Glucose-Capillary: 68 mg/dL (ref 65–99)
Glucose-Capillary: 127 mg/dL — ABNORMAL HIGH (ref 65–99)

## 2016-09-12 DIAGNOSIS — E119 Type 2 diabetes mellitus without complications: Secondary | ICD-10-CM | POA: Diagnosis not present

## 2016-09-12 LAB — GLUCOSE, CAPILLARY
GLUCOSE-CAPILLARY: 215 mg/dL — AB (ref 65–99)
Glucose-Capillary: 111 mg/dL — ABNORMAL HIGH (ref 65–99)

## 2016-09-13 DIAGNOSIS — E119 Type 2 diabetes mellitus without complications: Secondary | ICD-10-CM | POA: Diagnosis not present

## 2016-09-13 LAB — GLUCOSE, CAPILLARY: GLUCOSE-CAPILLARY: 111 mg/dL — AB (ref 65–99)

## 2016-09-14 LAB — GLUCOSE, CAPILLARY
GLUCOSE-CAPILLARY: 133 mg/dL — AB (ref 65–99)
Glucose-Capillary: 122 mg/dL — ABNORMAL HIGH (ref 65–99)
Glucose-Capillary: 129 mg/dL — ABNORMAL HIGH (ref 65–99)

## 2016-09-15 DIAGNOSIS — E119 Type 2 diabetes mellitus without complications: Secondary | ICD-10-CM | POA: Diagnosis not present

## 2016-09-15 LAB — GLUCOSE, CAPILLARY
GLUCOSE-CAPILLARY: 108 mg/dL — AB (ref 65–99)
Glucose-Capillary: 164 mg/dL — ABNORMAL HIGH (ref 65–99)

## 2016-09-16 DIAGNOSIS — E119 Type 2 diabetes mellitus without complications: Secondary | ICD-10-CM | POA: Diagnosis not present

## 2016-09-16 LAB — GLUCOSE, CAPILLARY
Glucose-Capillary: 118 mg/dL — ABNORMAL HIGH (ref 65–99)
Glucose-Capillary: 249 mg/dL — ABNORMAL HIGH (ref 65–99)

## 2016-09-17 DIAGNOSIS — E119 Type 2 diabetes mellitus without complications: Secondary | ICD-10-CM | POA: Diagnosis not present

## 2016-09-17 LAB — GLUCOSE, CAPILLARY
GLUCOSE-CAPILLARY: 143 mg/dL — AB (ref 65–99)
GLUCOSE-CAPILLARY: 174 mg/dL — AB (ref 65–99)

## 2016-09-18 DIAGNOSIS — E119 Type 2 diabetes mellitus without complications: Secondary | ICD-10-CM | POA: Diagnosis not present

## 2016-09-18 LAB — GLUCOSE, CAPILLARY
GLUCOSE-CAPILLARY: 229 mg/dL — AB (ref 65–99)
Glucose-Capillary: 91 mg/dL (ref 65–99)

## 2016-09-19 DIAGNOSIS — E119 Type 2 diabetes mellitus without complications: Secondary | ICD-10-CM | POA: Diagnosis not present

## 2016-09-19 LAB — GLUCOSE, CAPILLARY
Glucose-Capillary: 127 mg/dL — ABNORMAL HIGH (ref 65–99)
Glucose-Capillary: 150 mg/dL — ABNORMAL HIGH (ref 65–99)

## 2016-09-20 DIAGNOSIS — E119 Type 2 diabetes mellitus without complications: Secondary | ICD-10-CM | POA: Diagnosis not present

## 2016-09-20 LAB — GLUCOSE, CAPILLARY
GLUCOSE-CAPILLARY: 144 mg/dL — AB (ref 65–99)
Glucose-Capillary: 235 mg/dL — ABNORMAL HIGH (ref 65–99)

## 2016-09-21 DIAGNOSIS — E119 Type 2 diabetes mellitus without complications: Secondary | ICD-10-CM | POA: Diagnosis not present

## 2016-09-21 LAB — GLUCOSE, CAPILLARY
Glucose-Capillary: 135 mg/dL — ABNORMAL HIGH (ref 65–99)
Glucose-Capillary: 175 mg/dL — ABNORMAL HIGH (ref 65–99)

## 2016-09-22 DIAGNOSIS — E119 Type 2 diabetes mellitus without complications: Secondary | ICD-10-CM | POA: Diagnosis not present

## 2016-09-22 LAB — GLUCOSE, CAPILLARY
GLUCOSE-CAPILLARY: 78 mg/dL (ref 65–99)
GLUCOSE-CAPILLARY: 170 mg/dL — AB (ref 65–99)

## 2016-09-23 DIAGNOSIS — E119 Type 2 diabetes mellitus without complications: Secondary | ICD-10-CM | POA: Diagnosis not present

## 2016-09-23 LAB — GLUCOSE, CAPILLARY
GLUCOSE-CAPILLARY: 111 mg/dL — AB (ref 65–99)
Glucose-Capillary: 127 mg/dL — ABNORMAL HIGH (ref 65–99)

## 2016-09-24 DIAGNOSIS — E119 Type 2 diabetes mellitus without complications: Secondary | ICD-10-CM | POA: Diagnosis not present

## 2016-09-24 LAB — GLUCOSE, CAPILLARY
GLUCOSE-CAPILLARY: 189 mg/dL — AB (ref 65–99)
Glucose-Capillary: 89 mg/dL (ref 65–99)

## 2016-09-25 DIAGNOSIS — E119 Type 2 diabetes mellitus without complications: Secondary | ICD-10-CM | POA: Diagnosis not present

## 2016-09-25 LAB — GLUCOSE, CAPILLARY
GLUCOSE-CAPILLARY: 156 mg/dL — AB (ref 65–99)
GLUCOSE-CAPILLARY: 171 mg/dL — AB (ref 65–99)

## 2016-09-26 DIAGNOSIS — E119 Type 2 diabetes mellitus without complications: Secondary | ICD-10-CM | POA: Diagnosis not present

## 2016-09-26 LAB — GLUCOSE, CAPILLARY
GLUCOSE-CAPILLARY: 163 mg/dL — AB (ref 65–99)
Glucose-Capillary: 167 mg/dL — ABNORMAL HIGH (ref 65–99)

## 2016-09-27 DIAGNOSIS — E119 Type 2 diabetes mellitus without complications: Secondary | ICD-10-CM | POA: Diagnosis not present

## 2016-09-27 LAB — GLUCOSE, CAPILLARY
Glucose-Capillary: 100 mg/dL — ABNORMAL HIGH (ref 65–99)
Glucose-Capillary: 199 mg/dL — ABNORMAL HIGH (ref 65–99)

## 2016-09-28 DIAGNOSIS — E119 Type 2 diabetes mellitus without complications: Secondary | ICD-10-CM | POA: Diagnosis not present

## 2016-09-28 LAB — GLUCOSE, CAPILLARY
Glucose-Capillary: 139 mg/dL — ABNORMAL HIGH (ref 65–99)
Glucose-Capillary: 211 mg/dL — ABNORMAL HIGH (ref 65–99)

## 2016-09-29 DIAGNOSIS — E119 Type 2 diabetes mellitus without complications: Secondary | ICD-10-CM | POA: Diagnosis not present

## 2016-09-29 LAB — GLUCOSE, CAPILLARY
GLUCOSE-CAPILLARY: 156 mg/dL — AB (ref 65–99)
Glucose-Capillary: 145 mg/dL — ABNORMAL HIGH (ref 65–99)

## 2016-09-30 DIAGNOSIS — E119 Type 2 diabetes mellitus without complications: Secondary | ICD-10-CM | POA: Diagnosis not present

## 2016-09-30 LAB — GLUCOSE, CAPILLARY
GLUCOSE-CAPILLARY: 77 mg/dL (ref 65–99)
GLUCOSE-CAPILLARY: 185 mg/dL — AB (ref 65–99)

## 2016-10-01 DIAGNOSIS — E119 Type 2 diabetes mellitus without complications: Secondary | ICD-10-CM | POA: Diagnosis not present

## 2016-10-01 LAB — GLUCOSE, CAPILLARY
Glucose-Capillary: 145 mg/dL — ABNORMAL HIGH (ref 65–99)
Glucose-Capillary: 190 mg/dL — ABNORMAL HIGH (ref 65–99)

## 2016-10-02 DIAGNOSIS — E119 Type 2 diabetes mellitus without complications: Secondary | ICD-10-CM | POA: Diagnosis not present

## 2016-10-02 LAB — GLUCOSE, CAPILLARY
GLUCOSE-CAPILLARY: 175 mg/dL — AB (ref 65–99)
GLUCOSE-CAPILLARY: 251 mg/dL — AB (ref 65–99)

## 2016-10-03 DIAGNOSIS — E119 Type 2 diabetes mellitus without complications: Secondary | ICD-10-CM | POA: Diagnosis not present

## 2016-10-03 LAB — GLUCOSE, CAPILLARY
GLUCOSE-CAPILLARY: 116 mg/dL — AB (ref 65–99)
Glucose-Capillary: 185 mg/dL — ABNORMAL HIGH (ref 65–99)

## 2016-10-04 DIAGNOSIS — E119 Type 2 diabetes mellitus without complications: Secondary | ICD-10-CM | POA: Diagnosis not present

## 2016-10-04 LAB — GLUCOSE, CAPILLARY
GLUCOSE-CAPILLARY: 82 mg/dL (ref 65–99)
GLUCOSE-CAPILLARY: 208 mg/dL — AB (ref 65–99)

## 2016-10-05 DIAGNOSIS — E119 Type 2 diabetes mellitus without complications: Secondary | ICD-10-CM | POA: Diagnosis not present

## 2016-10-05 LAB — GLUCOSE, CAPILLARY
Glucose-Capillary: 123 mg/dL — ABNORMAL HIGH (ref 65–99)
Glucose-Capillary: 163 mg/dL — ABNORMAL HIGH (ref 65–99)

## 2016-10-06 ENCOUNTER — Non-Acute Institutional Stay (SKILLED_NURSING_FACILITY): Payer: Medicaid Other | Admitting: Gerontology

## 2016-10-06 ENCOUNTER — Encounter
Admission: RE | Admit: 2016-10-06 | Discharge: 2016-10-06 | Disposition: A | Discharge status: Home / Self Care | Payer: Medicaid Other | Source: Ambulatory Visit | Attending: Internal Medicine | Admitting: Internal Medicine

## 2016-10-06 DIAGNOSIS — E119 Type 2 diabetes mellitus without complications: Secondary | ICD-10-CM | POA: Diagnosis not present

## 2016-10-06 DIAGNOSIS — E785 Hyperlipidemia, unspecified: Secondary | ICD-10-CM | POA: Diagnosis not present

## 2016-10-06 DIAGNOSIS — I10 Essential (primary) hypertension: Secondary | ICD-10-CM | POA: Diagnosis not present

## 2016-10-06 DIAGNOSIS — E617 Deficiency of multiple nutrient elements: Secondary | ICD-10-CM

## 2016-10-06 DIAGNOSIS — F411 Generalized anxiety disorder: Secondary | ICD-10-CM

## 2016-10-06 DIAGNOSIS — K219 Gastro-esophageal reflux disease without esophagitis: Secondary | ICD-10-CM | POA: Diagnosis not present

## 2016-10-06 DIAGNOSIS — G809 Cerebral palsy, unspecified: Secondary | ICD-10-CM | POA: Diagnosis not present

## 2016-10-06 LAB — GLUCOSE, CAPILLARY
Glucose-Capillary: 123 mg/dL — ABNORMAL HIGH (ref 65–99)
Glucose-Capillary: 207 mg/dL — ABNORMAL HIGH (ref 65–99)

## 2016-10-07 DIAGNOSIS — E119 Type 2 diabetes mellitus without complications: Secondary | ICD-10-CM | POA: Diagnosis not present

## 2016-10-07 LAB — GLUCOSE, CAPILLARY
GLUCOSE-CAPILLARY: 179 mg/dL — AB (ref 65–99)
Glucose-Capillary: 127 mg/dL — ABNORMAL HIGH (ref 65–99)

## 2016-10-08 DIAGNOSIS — E119 Type 2 diabetes mellitus without complications: Secondary | ICD-10-CM | POA: Diagnosis not present

## 2016-10-08 LAB — GLUCOSE, CAPILLARY
GLUCOSE-CAPILLARY: 173 mg/dL — AB (ref 65–99)
GLUCOSE-CAPILLARY: 131 mg/dL — AB (ref 65–99)
GLUCOSE-CAPILLARY: 133 mg/dL — AB (ref 65–99)
Glucose-Capillary: 203 mg/dL — ABNORMAL HIGH (ref 65–99)

## 2016-10-10 DIAGNOSIS — E119 Type 2 diabetes mellitus without complications: Secondary | ICD-10-CM | POA: Diagnosis not present

## 2016-10-10 LAB — GLUCOSE, CAPILLARY
GLUCOSE-CAPILLARY: 123 mg/dL — AB (ref 65–99)
Glucose-Capillary: 257 mg/dL — ABNORMAL HIGH (ref 65–99)
Glucose-Capillary: 149 mg/dL — ABNORMAL HIGH (ref 65–99)

## 2016-10-11 DIAGNOSIS — E119 Type 2 diabetes mellitus without complications: Secondary | ICD-10-CM | POA: Diagnosis not present

## 2016-10-11 LAB — GLUCOSE, CAPILLARY
Glucose-Capillary: 177 mg/dL — ABNORMAL HIGH (ref 65–99)
Glucose-Capillary: 148 mg/dL — ABNORMAL HIGH (ref 65–99)

## 2016-10-12 LAB — GLUCOSE, CAPILLARY
Glucose-Capillary: 205 mg/dL — ABNORMAL HIGH (ref 65–99)
Glucose-Capillary: 165 mg/dL — ABNORMAL HIGH (ref 65–99)

## 2016-10-13 DIAGNOSIS — E119 Type 2 diabetes mellitus without complications: Secondary | ICD-10-CM | POA: Diagnosis not present

## 2016-10-13 LAB — GLUCOSE, CAPILLARY
GLUCOSE-CAPILLARY: 101 mg/dL — AB (ref 65–99)
Glucose-Capillary: 146 mg/dL — ABNORMAL HIGH (ref 65–99)

## 2016-10-14 DIAGNOSIS — E119 Type 2 diabetes mellitus without complications: Secondary | ICD-10-CM | POA: Diagnosis not present

## 2016-10-14 LAB — GLUCOSE, CAPILLARY
Glucose-Capillary: 191 mg/dL — ABNORMAL HIGH (ref 65–99)
Glucose-Capillary: 148 mg/dL — ABNORMAL HIGH (ref 65–99)

## 2016-10-17 DIAGNOSIS — E119 Type 2 diabetes mellitus without complications: Secondary | ICD-10-CM | POA: Diagnosis not present

## 2016-10-17 LAB — GLUCOSE, CAPILLARY
GLUCOSE-CAPILLARY: 149 mg/dL — AB (ref 65–99)
GLUCOSE-CAPILLARY: 126 mg/dL — AB (ref 65–99)
Glucose-Capillary: 263 mg/dL — ABNORMAL HIGH (ref 65–99)
Glucose-Capillary: 100 mg/dL — ABNORMAL HIGH (ref 65–99)
Glucose-Capillary: 221 mg/dL — ABNORMAL HIGH (ref 65–99)
Glucose-Capillary: 260 mg/dL — ABNORMAL HIGH (ref 65–99)

## 2016-10-18 DIAGNOSIS — E119 Type 2 diabetes mellitus without complications: Secondary | ICD-10-CM | POA: Diagnosis not present

## 2016-10-18 LAB — GLUCOSE, CAPILLARY
GLUCOSE-CAPILLARY: 197 mg/dL — AB (ref 65–99)
GLUCOSE-CAPILLARY: 106 mg/dL — AB (ref 65–99)

## 2016-10-19 LAB — GLUCOSE, CAPILLARY
GLUCOSE-CAPILLARY: 238 mg/dL — AB (ref 65–99)
Glucose-Capillary: 312 mg/dL — ABNORMAL HIGH (ref 65–99)
Glucose-Capillary: 153 mg/dL — ABNORMAL HIGH (ref 65–99)

## 2016-10-20 DIAGNOSIS — E119 Type 2 diabetes mellitus without complications: Secondary | ICD-10-CM | POA: Diagnosis not present

## 2016-10-20 LAB — GLUCOSE, CAPILLARY
Glucose-Capillary: 159 mg/dL — ABNORMAL HIGH (ref 65–99)
Glucose-Capillary: 79 mg/dL (ref 65–99)

## 2016-10-21 DIAGNOSIS — E119 Type 2 diabetes mellitus without complications: Secondary | ICD-10-CM | POA: Diagnosis not present

## 2016-10-21 LAB — GLUCOSE, CAPILLARY
GLUCOSE-CAPILLARY: 153 mg/dL — AB (ref 65–99)
GLUCOSE-CAPILLARY: 135 mg/dL — AB (ref 65–99)

## 2016-10-24 DIAGNOSIS — E119 Type 2 diabetes mellitus without complications: Secondary | ICD-10-CM | POA: Diagnosis not present

## 2016-10-24 LAB — GLUCOSE, CAPILLARY
GLUCOSE-CAPILLARY: 213 mg/dL — AB (ref 65–99)
GLUCOSE-CAPILLARY: 136 mg/dL — AB (ref 65–99)
GLUCOSE-CAPILLARY: 129 mg/dL — AB (ref 65–99)
Glucose-Capillary: 229 mg/dL — ABNORMAL HIGH (ref 65–99)
Glucose-Capillary: 277 mg/dL — ABNORMAL HIGH (ref 65–99)
Glucose-Capillary: 158 mg/dL — ABNORMAL HIGH (ref 65–99)

## 2016-10-25 DIAGNOSIS — E617 Deficiency of multiple nutrient elements: Secondary | ICD-10-CM | POA: Insufficient documentation

## 2016-10-25 DIAGNOSIS — K219 Gastro-esophageal reflux disease without esophagitis: Secondary | ICD-10-CM | POA: Insufficient documentation

## 2016-10-25 DIAGNOSIS — E119 Type 2 diabetes mellitus without complications: Secondary | ICD-10-CM | POA: Diagnosis not present

## 2016-10-25 LAB — GLUCOSE, CAPILLARY
GLUCOSE-CAPILLARY: 158 mg/dL — AB (ref 65–99)
GLUCOSE-CAPILLARY: 151 mg/dL — AB (ref 65–99)

## 2016-10-25 NOTE — Progress Notes (Signed)
Location:      Place of Service:  SNF (31) Provider:  Lorenso Quarry, NP-C  [facility] (Inactive)  Patient Care Team: [facility] (Inactive) as PCP - General  Extended Emergency Contact Information Primary Emergency Contact: Dillard,Julia Address: [address]          [place], [place] [address] [place] Phone: [phone] Relation: Daughter Secondary Emergency Contact: Johna Roles [place] Mobile Phone: [phone] Relation: Daughter  Code Status:  DNR Goals of care: Advanced Directive information Advanced Directives 06/17/2015  Does Patient Have a Medical Advance Directive? -  Type of Advance Directive -  Does patient want to make changes to medical advance directive? -  Copy of Healthcare Power of Attorney in Chart? Yes  Would patient like information on creating a medical advance directive? -  Pre-existing out of facility DNR order (yellow form or pink MOST form) -     Chief Complaint  Patient presents with  . Medical Management of Chronic Issues    HPI:  Pt is a 66 y.o. female seen today for medical management of chronic diseases. Pt has been a resident at the facility for 18 months. She is stable with her anxiety and depression. She is pleasant and enjoys the company of others and attending the facility activities. Her blood pressures have been well controlled without medication intervention and cholesterol is well controlled. Plan to recheck cholesterol at 6 month lab draw and possibly dc the Fish Oil if appropriate. Diabetes is now well controlled. FSBS have been stable except when pt has occasional sweet snack. No episodes of hypoglycemia. No neuropathy. DM foot exam is normal. No ulcers. Cerebral Palsy without complications. Pt uses wheelchair for general, longer distance mobility. She uses the walker for short distances. No recent falls. Pt participates in Restorative Nursing. GERD is stable. No recent use  of prn antacids, etc. Pt has regular BMs. Continent of B&B.Pt was taking Percocet regularly on admission for surgical pain. Now, takes only occasionally for intermittent headaches or hip pain. Plan to decrease availability of Percocet at next visit as she has used med several times recently. VSS. No other complaints.        Past Medical History:  Diagnosis Date  . Cerebral palsy (HCC)   . CHF (congestive heart failure) (HCC)   . Diabetes mellitus without complication (HCC)   . Hypertension    Past Surgical History:  Procedure Laterality Date  . ABDOMINAL HYSTERECTOMY    . cerbral palsy    . MOUTH SURGERY      Allergies  Allergen Reactions  . Promethazine     She has a possible respiratory reaction with this medication with RR 4 and hypoxia a few minutes after administration. Other respiratory depressants like benzos should be used with caution.  . Sulfa Antibiotics Swelling and Rash    Allergies as of 10/06/2016      Reactions   Promethazine    She has a possible respiratory reaction with this medication with RR 4 and hypoxia a few minutes after administration. Other respiratory depressants like benzos should be used with caution.   Sulfa Antibiotics Swelling, Rash      Medication List       Accurate as of 10/06/16 11:59 PM. Always use your most recent med list.          acetaminophen 325 MG tablet Commonly known as:  TYLENOL Take 650 mg by mouth every 4 (four) hours as needed.  aspirin 81 MG chewable tablet Chew 81 mg by mouth daily.   atorvastatin 40 MG tablet Commonly known as:  LIPITOR Take 40 mg by mouth daily.   busPIRone 7.5 MG tablet Commonly known as:  BUSPAR Take 1 tablet (7.5 mg total) by mouth 2 (two) times daily.   cefTRIAXone 1 g in dextrose 5 % 50 mL Inject 1 g into the vein daily.   diphenhydrAMINE 25 mg capsule Commonly known as:  BENADRYL Take 25 mg by mouth as needed.   feeding supplement (PRO-STAT SUGAR FREE 64) Liqd Take 30 mLs by mouth  2 (two) times daily at 10 AM and 5 PM.   FIRST AID PLUS LIDOCAINE EX Apply 1 application topically as needed (apply to broken skin areas).   Fish Oil 1200 MG Caps Take 1 capsule by mouth daily.   FLUoxetine 20 MG capsule Commonly known as:  PROZAC Take 1 capsule (20 mg total) by mouth daily.   insulin aspart cartridge Commonly known as:  NOVOLOG Correction coverage: Sensitive (thin, NPO, renal)     CBG < 70: implement hypoglycemia protocol    CBG 70 - 120: 0 units    CBG 121 - 150: 1 unit    CBG 151 - 200: 2 units    CBG 201 - 250: 3 units    CBG 251 - 300: 5 units    CBG 301 - 350: 7 units    CBG 351 - 400 9 units    CBG > 400 call MD and obtain STAT lab verification   insulin detemir 100 UNIT/ML injection Commonly known as:  LEVEMIR Inject 38 Units into the skin at bedtime.   lamoTRIgine 25 MG tablet Commonly known as:  LAMICTAL Take 25 mg by mouth daily.   loperamide 2 MG capsule Commonly known as:  IMODIUM Take 1 capsule (2 mg total) by mouth as needed for diarrhea or loose stools.   LORazepam 0.5 MG tablet Commonly known as:  ATIVAN Take 0.5 mg by mouth every 4 (four) hours as needed for anxiety.   magnesium oxide 400 MG tablet Commonly known as:  MAG-OX Take 250 mg by mouth daily.   multivitamin capsule Take 1 capsule by mouth daily.   NO FLUSH NIACIN 500 MG Tabs Generic drug:  Inositol Niacinate Take 1 tablet by mouth daily.   ondansetron 4 MG tablet Commonly known as:  ZOFRAN Take 1 tablet (4 mg total) by mouth every 8 (eight) hours as needed for nausea.   oxyCODONE-acetaminophen 5-325 MG tablet Commonly known as:  PERCOCET/ROXICET Take 1 tablet by mouth every 6 (six) hours as needed for moderate pain.   pantoprazole 40 MG tablet Commonly known as:  PROTONIX Take 1 tablet (40 mg total) by mouth daily.   potassium chloride 10 MEQ tablet Commonly known as:  K-DUR Take 1 tablet (10 mEq total) by mouth daily.   sennosides-docusate  sodium 8.6-50 MG tablet Commonly known as:  SENOKOT-S Take 1 tablet by mouth 2 (two) times daily.   venlafaxine 75 MG tablet Commonly known as:  EFFEXOR Take 75 mg by mouth 1 day or 1 dose.   VITAMIN D3 PO Take 1 tablet by mouth daily. 200 units/tablet       Review of Systems  Constitutional: Negative for activity change, appetite change, chills, diaphoresis and fever.  HENT: Negative for congestion, sneezing, sore throat, trouble swallowing and voice change.   Eyes: Negative for pain, redness and visual disturbance.  Respiratory: Negative for apnea, cough, choking, chest  tightness, shortness of breath and wheezing.   Cardiovascular: Negative for chest pain, palpitations and leg swelling.  Gastrointestinal: Negative for abdominal distention, abdominal pain, constipation, diarrhea and nausea.  Endocrine: Positive for cold intolerance.  Genitourinary: Negative for difficulty urinating, dysuria, frequency and urgency.  Musculoskeletal: Negative for back pain, gait problem and myalgias. Arthralgias: typical arthritis.  Skin: Negative for color change, pallor, rash and wound.  Neurological: Negative for dizziness, tremors, syncope, speech difficulty, weakness, numbness and headaches.  Psychiatric/Behavioral: Negative for agitation and behavioral problems.  All other systems reviewed and are negative.   Immunization History  Administered Date(s) Administered  . Influenza,inj,Quad PF,36+ Mos 04/06/2015   Pertinent  Health Maintenance Due  Topic Date Due  . FOOT EXAM  03/07/1963  . OPHTHALMOLOGY EXAM  03/07/1963  . PAP SMEAR  03/06/1974  . MAMMOGRAM  03/07/2003  . COLONOSCOPY  03/07/2003  . URINE MICROALBUMIN  10/28/2013  . INFLUENZA VACCINE  03/08/2016  . HEMOGLOBIN A1C  10/31/2016   No flowsheet data found. Functional Status Survey:    There were no vitals filed for this visit. There is no height or weight on file to calculate BMI. Physical Exam  Constitutional: She is  oriented to person, place, and time. Vital signs are normal. She appears well-developed and well-nourished. She is active and cooperative. She does not appear ill. No distress.  HENT:  Head: Normocephalic and atraumatic.  Mouth/Throat: Uvula is midline, oropharynx is clear and moist and mucous membranes are normal. Mucous membranes are not pale, not dry and not cyanotic.  Eyes: Conjunctivae, EOM and lids are normal. Pupils are equal, round, and reactive to light.  Neck: Trachea normal, normal range of motion and full passive range of motion without pain. Neck supple. No JVD present. No tracheal deviation, no edema and no erythema present. No thyromegaly present.  Cardiovascular: Normal rate, regular rhythm, normal heart sounds, intact distal pulses and normal pulses.  Exam reveals no gallop, no distant heart sounds and no friction rub.   No murmur heard. Pulmonary/Chest: Effort normal and breath sounds normal. No accessory muscle usage. No respiratory distress. She has no wheezes. She has no rales. She exhibits no tenderness.  Abdominal: Normal appearance and bowel sounds are normal. She exhibits no distension and no ascites. There is no tenderness.  Musculoskeletal: Normal range of motion. She exhibits no edema or tenderness.  Expected osteoarthritis, stiffness  Neurological: She is alert and oriented to person, place, and time. She has normal strength.  Skin: Skin is warm, dry and intact. No rash noted. She is not diaphoretic. No cyanosis or erythema. No pallor. Nails show no clubbing.  Foot exam- negative for ulcers or other wounds. Sensation intact. C/O cold feet. Nail in good condition  Psychiatric: She has a normal mood and affect. Her speech is normal and behavior is normal. Judgment and thought content normal. Cognition and memory are normal.  Nursing note and vitals reviewed.   Labs reviewed:  Recent Labs  02/02/16 0818 06/17/16 0605  NA 138 137  K 4.0 3.8  CL 100* 101  CO2 30 29   GLUCOSE 272* 130*  BUN 31* 23*  CREATININE 0.50 0.51  CALCIUM 9.2 9.6  MG 1.8  --     Recent Labs  06/17/16 0605  AST 21  ALT 22  ALKPHOS 89  BILITOT 0.5  PROT 7.6  ALBUMIN 4.0    Recent Labs  06/17/16 0605  WBC 10.0  HGB 13.4  HCT 39.1  MCV 93.7  PLT  337   Lab Results  Component Value Date   TSH 3.193 06/17/2016   Lab Results  Component Value Date   HGBA1C 8.7 (H) 05/03/2016   Lab Results  Component Value Date   CHOL 65 04/16/2015   HDL 14 (L) 04/16/2015   LDLCALC 36 04/16/2015   TRIG 76 04/16/2015   CHOLHDL 4.6 04/16/2015    Significant Diagnostic Results in last 30 days:  No results found.  Assessment/Plan 1. Essential hypertension  Stable  No medications at this time  Continue ASA 81 mg po Q Day  2. Diabetes mellitus without complication (HCC)  Stable  Continue Lantus 84 units once daily in the morning  Continue Carb-Controlled diet  FSBS BID and PRN  3. Cerebral palsy, unspecified type (HCC)  Stable  Continue Restorative Nursing  Wheelchair for mobility  4. Hyperlipidemia, unspecified hyperlipidemia type  Stable  Continue Fish Oil [phone] mg- 1 capsule once daily for hyperlipidemia  5. Anxiety state  Stable  Continue Buspirone 15 mg po BID  Continue Lamictal 50 mg po BID for mood stabilization  Continue Venlafaxine XR 150 mg po Q Day for anxiety/depression  Continue hydroxyzine 25 mg po Q 6 hours prn- itching, anxiety  6. GERD  Stable  Continue Protonix 20 mg po Q Day  Continue Senna S- 1 tablet PO on Mondays and Thursdays  7. Deficiency of multiple nutritional elements  Continue Cyanocobalamin 500 mcg po Q day  Continue Cholecalciferol 2,000 units po Q Day  Continue Daily Multivitamin  Continue Magnesium Oxide 250 mg po Q day  Continue Potassium Chloride 20 meq po Q Day  Family/ staff Communication:   Total Time:  Documentation:  Face to Face:  Family/Phone:   Labs/tests ordered:     Medication list reviewed and assessed for continued appropriateness. Monthly medication orders reviewed and signed.  Brynda RimShannon H. Clarise Chacko, NP-C [facility] [phone] [address] [place], [place] [address] Cell Phone (Mon-Fri 8am-5pm):  [phone] On Call:  [phone] & follow prompts after 5pm & weekends Office Phone:  [phone] Office Fax:  [phone]

## 2016-10-26 LAB — GLUCOSE, CAPILLARY
Glucose-Capillary: 139 mg/dL — ABNORMAL HIGH (ref 65–99)
Glucose-Capillary: 150 mg/dL — ABNORMAL HIGH (ref 65–99)

## 2016-10-27 DIAGNOSIS — K219 Gastro-esophageal reflux disease without esophagitis: Secondary | ICD-10-CM | POA: Diagnosis not present

## 2016-10-27 DIAGNOSIS — I69351 Hemiplegia and hemiparesis following cerebral infarction affecting right dominant side: Secondary | ICD-10-CM

## 2016-10-27 DIAGNOSIS — G800 Spastic quadriplegic cerebral palsy: Secondary | ICD-10-CM | POA: Diagnosis not present

## 2016-10-27 DIAGNOSIS — E1152 Type 2 diabetes mellitus with diabetic peripheral angiopathy with gangrene: Secondary | ICD-10-CM

## 2016-10-27 DIAGNOSIS — E049 Nontoxic goiter, unspecified: Secondary | ICD-10-CM

## 2016-10-28 DIAGNOSIS — B351 Tinea unguium: Secondary | ICD-10-CM

## 2016-11-24 DIAGNOSIS — I69351 Hemiplegia and hemiparesis following cerebral infarction affecting right dominant side: Secondary | ICD-10-CM | POA: Diagnosis not present

## 2016-11-24 DIAGNOSIS — F39 Unspecified mood [affective] disorder: Secondary | ICD-10-CM | POA: Diagnosis not present

## 2016-11-24 DIAGNOSIS — G800 Spastic quadriplegic cerebral palsy: Secondary | ICD-10-CM | POA: Diagnosis not present

## 2016-11-24 DIAGNOSIS — E1142 Type 2 diabetes mellitus with diabetic polyneuropathy: Secondary | ICD-10-CM | POA: Diagnosis not present

## 2016-12-15 IMAGING — US US ABDOMEN COMPLETE
1 series · 14 of 25 positions shown · non-contrast
Comparison: None.

CLINICAL DATA: Nausea.

EXAM:
ULTRASOUND ABDOMEN COMPLETE

[Series 1: us abdomen complete · 0.31mm/px · 14 of 91 slices shown]
[im 1/91]
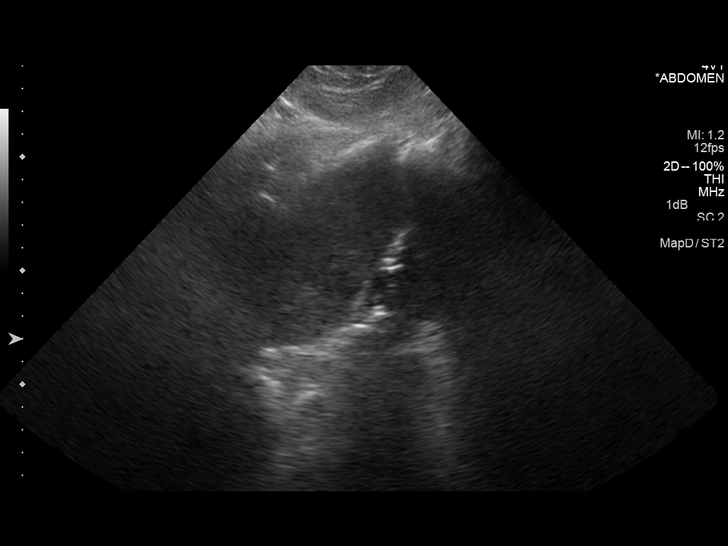
[im 8/91]
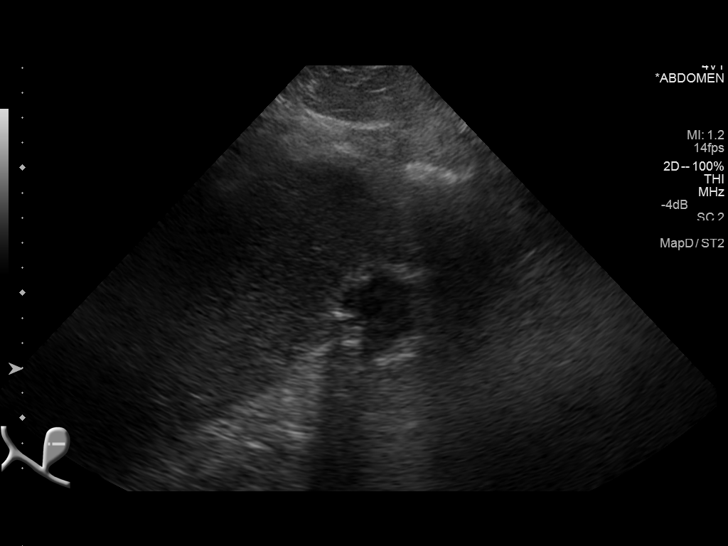
[im 16/91]
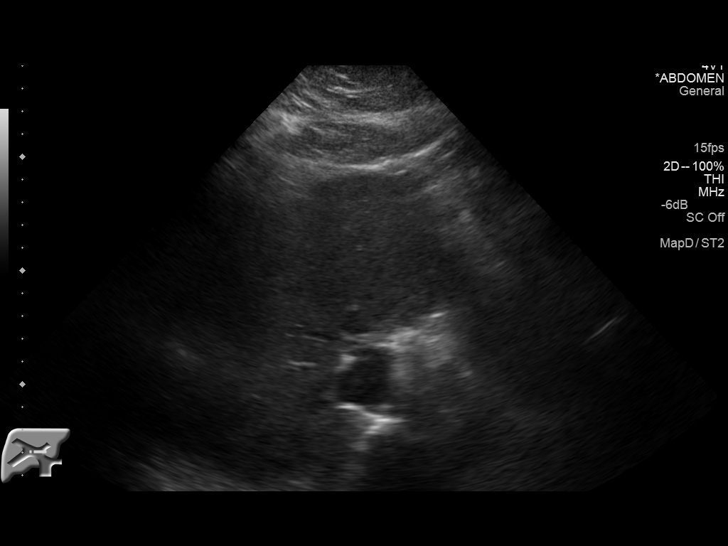
[im 23/91]
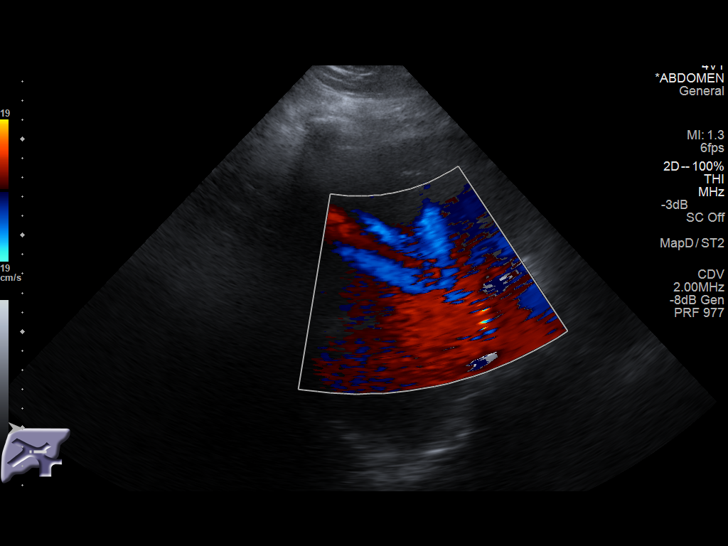
[im 31/91]
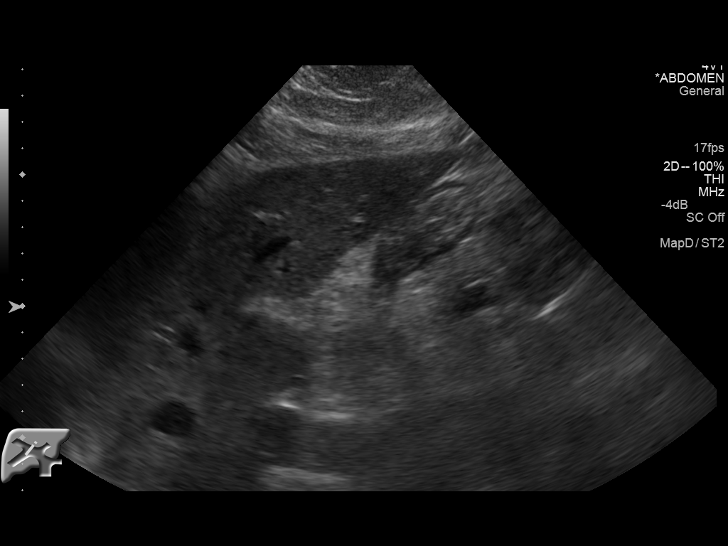
[im 34/91]
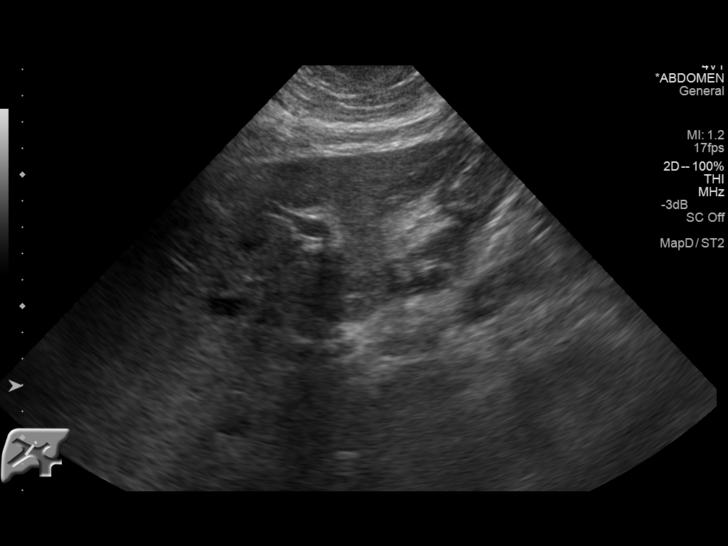
[im 42/91]
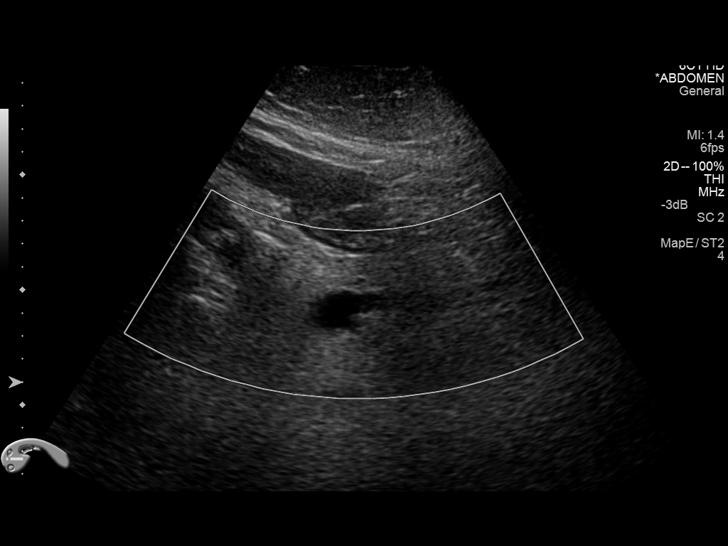
[im 49/91]
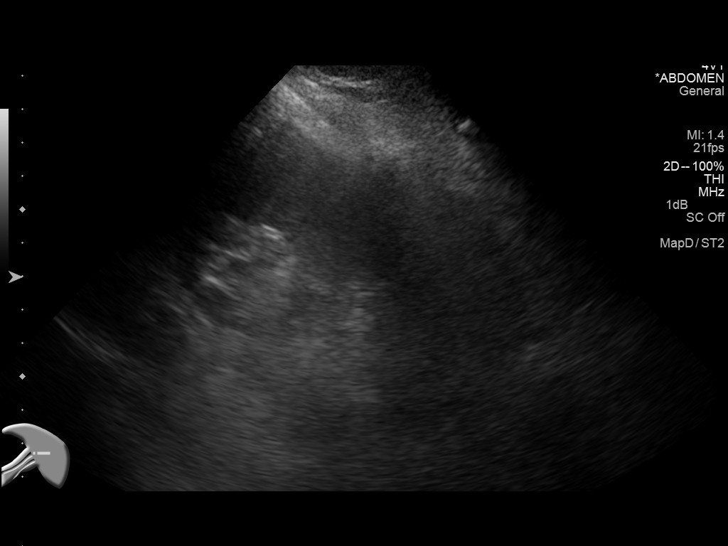
[im 57/91]
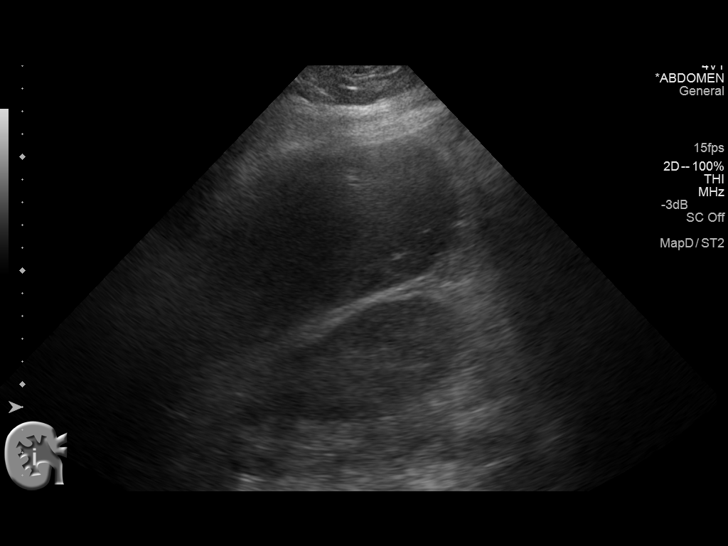
[im 61/91]
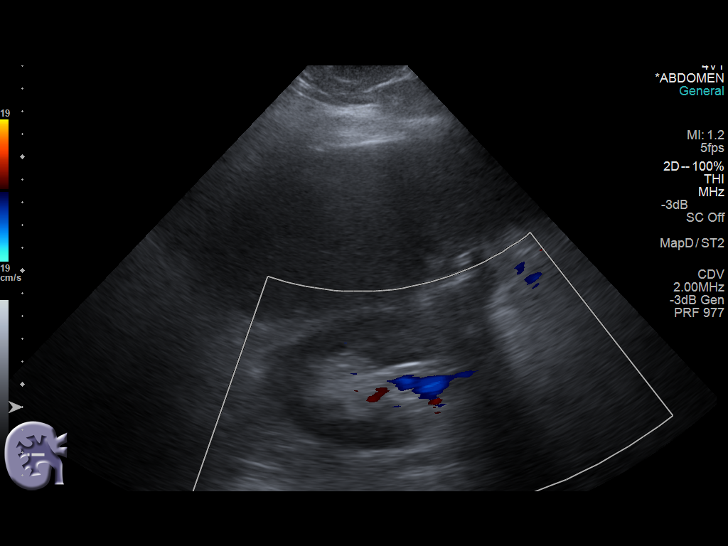
[im 68/91]
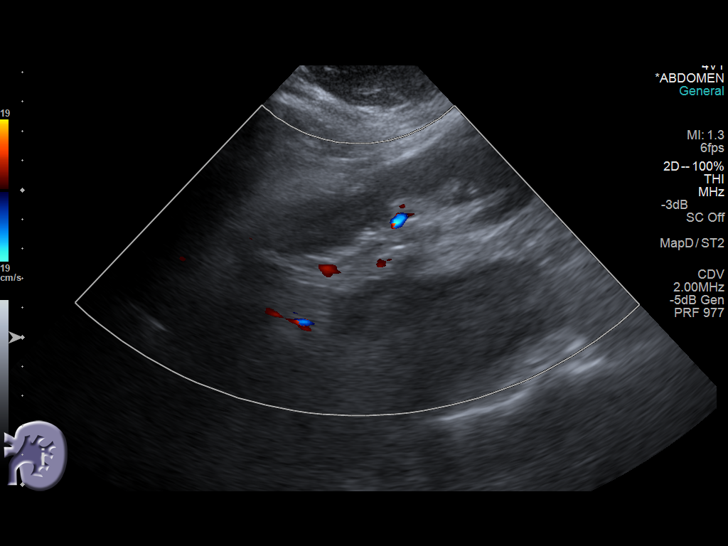
[im 76/91]
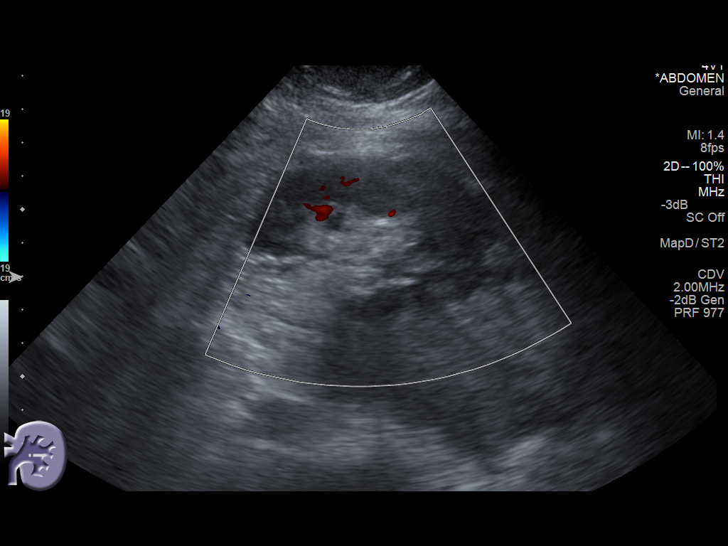
[im 83/91]
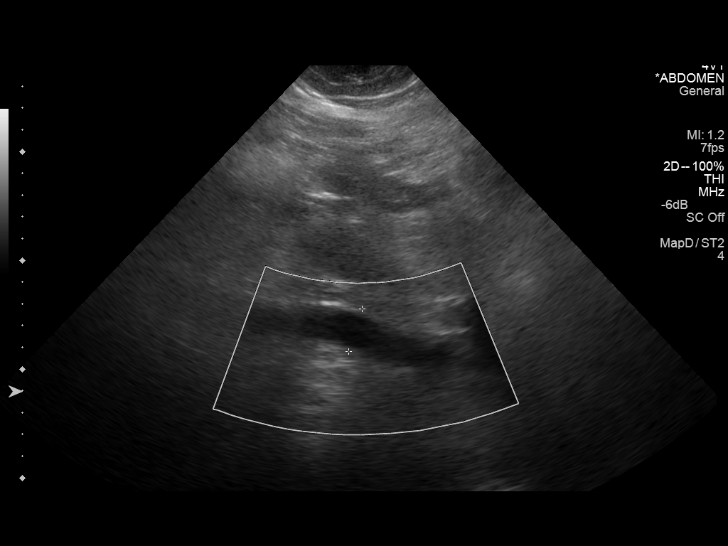
[im 91/91]
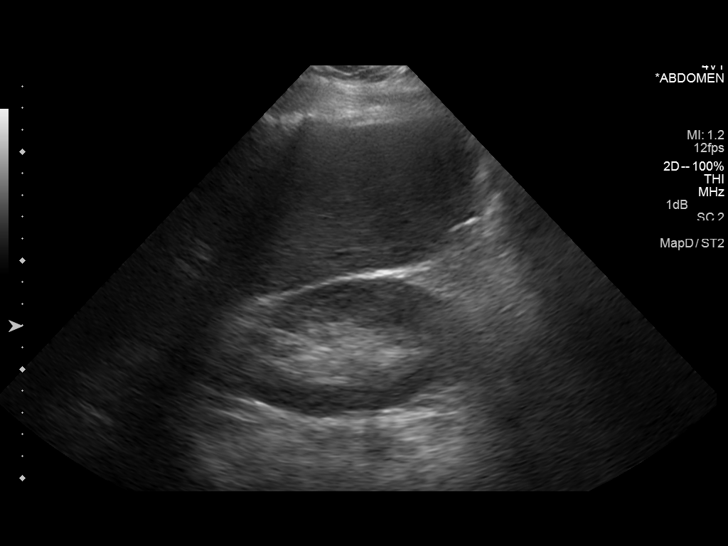

[14 of 25 positions shown; findings below may reference images not displayed]

FINDINGS: Gallbladder: Numerous gallstones are noted in the gallbladder. The
largest measures 22 mm. The gallbladder is mildly contracted. No
gallbladder wall thickening or pericholecystic fluid. No sonographic
Murphy sign.

Common bile duct: Diameter: 3.3 mm

Liver: Normal echogenicity without focal lesion or biliary
dilatation.

IVC: Normal caliber

Pancreas: Visualized portion unremarkable.

Spleen: Normal size and echogenicity without focal lesions.

Right Kidney: Length: 14.3 cm. Normal renal cortical thickness and
echogenicity without focal lesions or hydronephrosis.

Left Kidney: Length: 13.3 cm. Normal renal cortical thickness and
echogenicity without focal lesions or hydronephrosis.

Abdominal aorta: Normal caliber

Other findings: No ascites
IMPRESSION: Cholelithiasis without definite sonographic findings for acute
cholecystitis.

Normal caliber common bile duct.

## 2016-12-20 DIAGNOSIS — I872 Venous insufficiency (chronic) (peripheral): Secondary | ICD-10-CM | POA: Diagnosis not present

## 2016-12-23 DIAGNOSIS — G809 Cerebral palsy, unspecified: Secondary | ICD-10-CM | POA: Diagnosis not present

## 2016-12-23 DIAGNOSIS — K219 Gastro-esophageal reflux disease without esophagitis: Secondary | ICD-10-CM | POA: Diagnosis not present

## 2016-12-23 DIAGNOSIS — F39 Unspecified mood [affective] disorder: Secondary | ICD-10-CM | POA: Diagnosis not present

## 2016-12-23 DIAGNOSIS — E11610 Type 2 diabetes mellitus with diabetic neuropathic arthropathy: Secondary | ICD-10-CM | POA: Diagnosis not present

## 2016-12-23 DIAGNOSIS — E049 Nontoxic goiter, unspecified: Secondary | ICD-10-CM | POA: Diagnosis not present

## 2016-12-23 DIAGNOSIS — I69351 Hemiplegia and hemiparesis following cerebral infarction affecting right dominant side: Secondary | ICD-10-CM | POA: Diagnosis not present

## 2016-12-23 DIAGNOSIS — I4891 Unspecified atrial fibrillation: Secondary | ICD-10-CM | POA: Diagnosis not present

## 2016-12-26 ENCOUNTER — Ambulatory Visit: Payer: Medicaid Other | Admitting: Family Medicine

## 2017-01-24 DIAGNOSIS — F39 Unspecified mood [affective] disorder: Secondary | ICD-10-CM | POA: Diagnosis not present

## 2017-01-24 DIAGNOSIS — R197 Diarrhea, unspecified: Secondary | ICD-10-CM | POA: Diagnosis not present

## 2017-01-24 DIAGNOSIS — E1142 Type 2 diabetes mellitus with diabetic polyneuropathy: Secondary | ICD-10-CM | POA: Diagnosis not present

## 2017-01-24 DIAGNOSIS — I69354 Hemiplegia and hemiparesis following cerebral infarction affecting left non-dominant side: Secondary | ICD-10-CM | POA: Diagnosis not present

## 2017-01-24 DIAGNOSIS — G809 Cerebral palsy, unspecified: Secondary | ICD-10-CM | POA: Diagnosis not present

## 2017-02-16 DIAGNOSIS — R05 Cough: Secondary | ICD-10-CM | POA: Diagnosis not present

## 2017-02-20 DIAGNOSIS — L304 Erythema intertrigo: Secondary | ICD-10-CM | POA: Diagnosis not present

## 2017-02-20 DIAGNOSIS — J209 Acute bronchitis, unspecified: Secondary | ICD-10-CM | POA: Diagnosis not present

## 2017-02-20 DIAGNOSIS — B372 Candidiasis of skin and nail: Secondary | ICD-10-CM | POA: Diagnosis not present

## 2017-03-29 DIAGNOSIS — G309 Alzheimer's disease, unspecified: Secondary | ICD-10-CM | POA: Diagnosis not present

## 2017-03-29 DIAGNOSIS — F39 Unspecified mood [affective] disorder: Secondary | ICD-10-CM | POA: Diagnosis not present

## 2017-03-29 DIAGNOSIS — I69351 Hemiplegia and hemiparesis following cerebral infarction affecting right dominant side: Secondary | ICD-10-CM | POA: Diagnosis not present

## 2017-03-29 DIAGNOSIS — E1140 Type 2 diabetes mellitus with diabetic neuropathy, unspecified: Secondary | ICD-10-CM | POA: Diagnosis not present

## 2017-03-30 DIAGNOSIS — L03115 Cellulitis of right lower limb: Secondary | ICD-10-CM | POA: Diagnosis not present

## 2017-03-30 DIAGNOSIS — I872 Venous insufficiency (chronic) (peripheral): Secondary | ICD-10-CM | POA: Diagnosis not present

## 2017-05-18 DIAGNOSIS — E1142 Type 2 diabetes mellitus with diabetic polyneuropathy: Secondary | ICD-10-CM

## 2017-05-18 DIAGNOSIS — G804 Ataxic cerebral palsy: Secondary | ICD-10-CM

## 2017-05-18 DIAGNOSIS — I69351 Hemiplegia and hemiparesis following cerebral infarction affecting right dominant side: Secondary | ICD-10-CM

## 2017-05-18 DIAGNOSIS — F39 Unspecified mood [affective] disorder: Secondary | ICD-10-CM

## 2017-06-15 DIAGNOSIS — N390 Urinary tract infection, site not specified: Secondary | ICD-10-CM

## 2017-06-15 DIAGNOSIS — F05 Delirium due to known physiological condition: Secondary | ICD-10-CM

## 2017-07-19 DIAGNOSIS — I872 Venous insufficiency (chronic) (peripheral): Secondary | ICD-10-CM

## 2017-07-19 DIAGNOSIS — N3000 Acute cystitis without hematuria: Secondary | ICD-10-CM

## 2017-07-26 DIAGNOSIS — E1140 Type 2 diabetes mellitus with diabetic neuropathy, unspecified: Secondary | ICD-10-CM

## 2017-07-26 DIAGNOSIS — I69351 Hemiplegia and hemiparesis following cerebral infarction affecting right dominant side: Secondary | ICD-10-CM

## 2017-07-26 DIAGNOSIS — G809 Cerebral palsy, unspecified: Secondary | ICD-10-CM

## 2017-07-26 DIAGNOSIS — F39 Unspecified mood [affective] disorder: Secondary | ICD-10-CM

## 2017-08-16 DIAGNOSIS — B351 Tinea unguium: Secondary | ICD-10-CM

## 2017-10-05 DIAGNOSIS — E1142 Type 2 diabetes mellitus with diabetic polyneuropathy: Secondary | ICD-10-CM | POA: Diagnosis not present

## 2017-10-05 DIAGNOSIS — G809 Cerebral palsy, unspecified: Secondary | ICD-10-CM | POA: Diagnosis not present

## 2017-10-05 DIAGNOSIS — M159 Polyosteoarthritis, unspecified: Secondary | ICD-10-CM | POA: Diagnosis not present

## 2017-10-05 DIAGNOSIS — I69351 Hemiplegia and hemiparesis following cerebral infarction affecting right dominant side: Secondary | ICD-10-CM | POA: Diagnosis not present

## 2017-10-05 DIAGNOSIS — F39 Unspecified mood [affective] disorder: Secondary | ICD-10-CM | POA: Diagnosis not present

## 2017-10-11 DIAGNOSIS — L0201 Cutaneous abscess of face: Secondary | ICD-10-CM | POA: Diagnosis not present

## 2017-10-20 ENCOUNTER — Telehealth: Payer: Self-pay

## 2017-10-20 DIAGNOSIS — T7840XA Allergy, unspecified, initial encounter: Secondary | ICD-10-CM | POA: Diagnosis not present

## 2017-10-20 NOTE — Telephone Encounter (Signed)
Should be getting evaluation by Destiny [facility]heregina [facility]---I did get an email about this

## 2017-10-20 NOTE — Telephone Encounter (Signed)
PLEASE NOTE: All timestamps contained within this report are represented as [place] Standard Time. CONFIDENTIALTY NOTICE: This fax transmission is intended only for the addressee. It contains information that is legally privileged, confidential or otherwise protected from use or disclosure. If you are not the intended recipient, you are strictly prohibited from reviewing, disclosing, copying using or disseminating any of this information or taking any action in reliance on or regarding this information. If you have received this fax in error, please notify us immediately by telephone so that we can arrange for its return to [place]. Phone: [phone], Toll-Free: [phone], Fax: [phone] Page: 1 of 2 Call Id: [id number] [facility] Night - Client TELEPHONE ADVICE RECORD [facility] Patient Name: Destiny Cole Gender: Female DOB: 10/09/1950 Age: 67 Y 7 M 15 D Return Phone Number: Address: City/State/Zip: [place] [facility] Night - Client Client Site [facility] - Night Physician Tillman Abide - MD Contact Type Call Who Is Calling Nursing Home / Skilled Nursing Facility Call Type Triage / Clinical Caller Name Olegario Messier Return Phone Number Please choose phone number Facility Name [facility] Facility Number [phone] Chief Complaint Allergic reaction (unknown symptoms) Reason for Call Request to speak to Physician Initial Comment [facility] Additional Comment She is on bactrum and is allergic to it and is breaking out. GOTO Facility Not Listed on site Translation No Nurse Assessment Nurse: Cherly Hensen, RN, Melissa Date/Time (Eastern Time): 10/20/2017 4:36:20 AM Confirm and document reason for call. If symptomatic, describe symptoms. ---Caller states that the pt has been on Bactrim for 7 days and she is itching and has a rash on legs and arms. Does the patient  have any new or worsening symptoms? ---Yes Will a triage be completed? ---Yes Related visit to physician within the last 2 weeks? ---Yes Does the PT have any chronic conditions? (i.e. diabetes, asthma, etc.) ---Yes List chronic conditions. ---hemoplegia, CP, heart failure, diabetes Is this a behavioral health or substance abuse call? ---No Guidelines Guideline Title Affirmed Question Affirmed Notes Nurse Date/Time (Eastern Time) Rash - Widespread On Drugs Hives or itching Cherly Hensen, RN, Melissa 10/20/2017 4:38:40 AM Disp. Time Lamount Cohen Time) Disposition Final User 10/20/2017 4:40:28 AM See Physician within 24 Hours Yes Cherly Hensen, RN, Melissa PLEASE NOTE: All timestamps contained within this report are represented as [place] Standard Time. CONFIDENTIALTY NOTICE: This fax transmission is intended only for the addressee. It contains information that is legally privileged, confidential or otherwise protected from use or disclosure. If you are not the intended recipient, you are strictly prohibited from reviewing, disclosing, copying using or disseminating any of this information or taking any action in reliance on or regarding this information. If you have received this fax in error, please notify us immediately by telephone so that we can arrange for its return to [place]. Phone: [phone], Toll-Free: [phone], Fax: [phone] Page: 2 of 2 Call Id: [id number] Caller Disagree/Comply Comply Caller Understands Yes PreDisposition Call Doctor Care Advice Given Per Guideline SEE PHYSICIAN WITHIN 24 HOURS: * IF OFFICE WILL BE OPEN: You need to be seen within the next 24 hours. Call your doctor when the office opens, and make an appointment. ANTIHISTAMINE FOR HIVES OR SEVERE ITCHING: * Take an antihistamine by mouth. Diphenhydramine (OTC Benadryl) is a good choice. Adult dose is 25-50 mg. Take it up to 4 times a day. CALL BACK IF: * You become worse. CARE ADVICE given per Rash - Widespread on Drugs  (  Adult) guideline Referrals GO TO FACILITY OTHER - SPECIFY

## 2017-10-21 ENCOUNTER — Other Ambulatory Visit: Payer: Self-pay

## 2017-10-21 ENCOUNTER — Emergency Department: Payer: Medicaid Other

## 2017-10-21 ENCOUNTER — Emergency Department
Admission: EM | Admit: 2017-10-21 | Discharge: 2017-10-24 | Disposition: A | Discharge status: Home / Self Care | Payer: Medicaid Other | Attending: Emergency Medicine | Admitting: Emergency Medicine

## 2017-10-21 DIAGNOSIS — Z794 Long term (current) use of insulin: Secondary | ICD-10-CM | POA: Insufficient documentation

## 2017-10-21 DIAGNOSIS — E119 Type 2 diabetes mellitus without complications: Secondary | ICD-10-CM | POA: Insufficient documentation

## 2017-10-21 DIAGNOSIS — Z7982 Long term (current) use of aspirin: Secondary | ICD-10-CM | POA: Diagnosis not present

## 2017-10-21 DIAGNOSIS — R410 Disorientation, unspecified: Secondary | ICD-10-CM | POA: Diagnosis not present

## 2017-10-21 DIAGNOSIS — I110 Hypertensive heart disease with heart failure: Secondary | ICD-10-CM | POA: Diagnosis not present

## 2017-10-21 DIAGNOSIS — I509 Heart failure, unspecified: Secondary | ICD-10-CM | POA: Insufficient documentation

## 2017-10-21 DIAGNOSIS — R739 Hyperglycemia, unspecified: Secondary | ICD-10-CM

## 2017-10-21 DIAGNOSIS — R441 Visual hallucinations: Secondary | ICD-10-CM

## 2017-10-21 DIAGNOSIS — Z79899 Other long term (current) drug therapy: Secondary | ICD-10-CM | POA: Insufficient documentation

## 2017-10-21 DIAGNOSIS — E1165 Type 2 diabetes mellitus with hyperglycemia: Secondary | ICD-10-CM | POA: Diagnosis present

## 2017-10-21 LAB — URINALYSIS, COMPLETE (UACMP) WITH MICROSCOPIC
Bacteria, UA: NONE SEEN
Bilirubin Urine: NEGATIVE
Ketones, ur: NEGATIVE mg/dL
Leukocytes, UA: NEGATIVE
Nitrite: NEGATIVE
PH: 5 (ref 5.0–8.0)
Protein, ur: 100 mg/dL — AB
SPECIFIC GRAVITY, URINE: 1.022 (ref 1.005–1.030)

## 2017-10-21 LAB — COMPREHENSIVE METABOLIC PANEL
ALK PHOS: 108 U/L (ref 38–126)
ALT: 23 U/L (ref 14–54)
AST: 23 U/L (ref 15–41)
Albumin: 3.5 g/dL (ref 3.5–5.0)
Anion gap: 8 (ref 5–15)
BILIRUBIN TOTAL: 0.4 mg/dL (ref 0.3–1.2)
BUN: 50 mg/dL — AB (ref 6–20)
CALCIUM: 9.5 mg/dL (ref 8.9–10.3)
CHLORIDE: 98 mmol/L — AB (ref 101–111)
CO2: 27 mmol/L (ref 22–32)
CREATININE: 0.95 mg/dL (ref 0.44–1.00)
Glucose, Bld: 599 mg/dL (ref 65–99)
Potassium: 4.3 mmol/L (ref 3.5–5.1)
Sodium: 133 mmol/L — ABNORMAL LOW (ref 135–145)
Total Protein: 7.9 g/dL (ref 6.5–8.1)

## 2017-10-21 LAB — TROPONIN I: TROPONIN I: 0.03 ng/mL — AB (ref <0.03)

## 2017-10-21 LAB — URINE DRUG SCREEN, QUALITATIVE (ARMC ONLY)
AMPHETAMINES, UR SCREEN: NOT DETECTED
BARBITURATES, UR SCREEN: NOT DETECTED
Benzodiazepine, Ur Scrn: NOT DETECTED
COCAINE METABOLITE, UR ~~LOC~~: NOT DETECTED
Cannabinoid 50 Ng, Ur ~~LOC~~: NOT DETECTED
MDMA (Ecstasy)Ur Screen: NOT DETECTED
METHADONE SCREEN, URINE: NOT DETECTED
OPIATE, UR SCREEN: NOT DETECTED
PHENCYCLIDINE (PCP) UR S: NOT DETECTED
Tricyclic, Ur Screen: NOT DETECTED

## 2017-10-21 LAB — CBC
HCT: 40.1 % (ref 35.0–47.0)
Hemoglobin: 13.1 g/dL (ref 12.0–16.0)
MCH: 30.9 pg (ref 26.0–34.0)
MCHC: 32.6 g/dL (ref 32.0–36.0)
MCV: 94.8 fL (ref 80.0–100.0)
PLATELETS: 374 10*3/uL (ref 150–440)
RBC: 4.24 MIL/uL (ref 3.80–5.20)
RDW: 13.5 % (ref 11.5–14.5)
WBC: 12.4 10*3/uL — ABNORMAL HIGH (ref 3.6–11.0)

## 2017-10-21 LAB — GLUCOSE, CAPILLARY
GLUCOSE-CAPILLARY: 517 mg/dL — AB (ref 65–99)
GLUCOSE-CAPILLARY: 355 mg/dL — AB (ref 65–99)
GLUCOSE-CAPILLARY: 366 mg/dL — AB (ref 65–99)
Glucose-Capillary: >600 mg/dL (ref 65–99)
Glucose-Capillary: 382 mg/dL — ABNORMAL HIGH (ref 65–99)

## 2017-10-21 LAB — ETHANOL: Alcohol, Ethyl (B): <10 mg/dL (ref <10)

## 2017-10-21 MED ORDER — INSULIN ASPART 100 UNIT/ML ~~LOC~~ SOLN
SUBCUTANEOUS | Status: AC
  Filled 2017-10-21: qty 1

## 2017-10-21 MED ORDER — INSULIN ASPART 100 UNIT/ML CARTRIDGE (PENFILL)
1.0000 [IU] | Freq: Three times a day (TID) | SUBCUTANEOUS | Status: DC
  Filled 2017-10-21: qty 3

## 2017-10-21 MED ORDER — SODIUM CHLORIDE 0.9 % IV BOLUS (SEPSIS)
1000.0000 mL | Freq: Once | INTRAVENOUS | Status: AC
  Administered 2017-10-21: 1000 mL via INTRAVENOUS

## 2017-10-21 MED ORDER — VENLAFAXINE HCL ER 37.5 MG PO CP24
37.5000 mg | ORAL_CAPSULE | Freq: Every day | ORAL | Status: DC
  Administered 2017-10-22 – 2017-10-24 (×3): 37.5 mg via ORAL
  Filled 2017-10-21 (×3): qty 1

## 2017-10-21 MED ORDER — LORAZEPAM 2 MG/ML IJ SOLN: 1.0000 mg | INTRAMUSCULAR | Status: DC | PRN

## 2017-10-21 MED ORDER — HYDROXYZINE HCL 25 MG PO TABS
25.0000 mg | ORAL_TABLET | Freq: Once | ORAL | Status: AC
  Administered 2017-10-21: 25 mg via ORAL
  Filled 2017-10-21: qty 1

## 2017-10-21 MED ORDER — BUSPIRONE HCL 5 MG PO TABS
15.0000 mg | ORAL_TABLET | Freq: Two times a day (BID) | ORAL | Status: DC
  Administered 2017-10-21 – 2017-10-23 (×5): 15 mg via ORAL
  Filled 2017-10-21 (×5): qty 3

## 2017-10-21 MED ORDER — INSULIN ASPART 100 UNIT/ML ~~LOC~~ SOLN
5.0000 [IU] | Freq: Once | SUBCUTANEOUS | Status: AC
  Administered 2017-10-21: 5 [IU] via SUBCUTANEOUS
  Filled 2017-10-21: qty 1

## 2017-10-21 MED ORDER — INSULIN ASPART 100 UNIT/ML ~~LOC~~ SOLN
6.0000 [IU] | Freq: Once | SUBCUTANEOUS | Status: AC
  Administered 2017-10-21: 6 [IU] via SUBCUTANEOUS

## 2017-10-21 MED ORDER — ASPIRIN 81 MG PO CHEW: 81.0000 mg | CHEWABLE_TABLET | Freq: Every day | ORAL | Status: DC

## 2017-10-21 MED ORDER — INSULIN ASPART 100 UNIT/ML ~~LOC~~ SOLN
0.0000 [IU] | Freq: Three times a day (TID) | SUBCUTANEOUS | Status: DC
  Administered 2017-10-22: 2 [IU] via SUBCUTANEOUS
  Administered 2017-10-22 (×2): 5 [IU] via SUBCUTANEOUS
  Administered 2017-10-23: 2 [IU] via SUBCUTANEOUS
  Administered 2017-10-23: 3 [IU] via SUBCUTANEOUS
  Administered 2017-10-23: 1 [IU] via SUBCUTANEOUS
  Filled 2017-10-21 (×8): qty 1

## 2017-10-21 MED ORDER — SODIUM CHLORIDE 0.9 % IV SOLN
1000.0000 mL | Freq: Once | INTRAVENOUS | Status: AC
  Administered 2017-10-21: 1000 mL via INTRAVENOUS

## 2017-10-21 MED ORDER — GABAPENTIN 100 MG PO CAPS
100.0000 mg | ORAL_CAPSULE | Freq: Every day | ORAL | Status: DC
  Administered 2017-10-21 – 2017-10-23 (×3): 100 mg via ORAL
  Filled 2017-10-21 (×4): qty 1

## 2017-10-21 MED ORDER — OLANZAPINE 2.5 MG PO TABS
1.2500 mg | ORAL_TABLET | Freq: Two times a day (BID) | ORAL | Status: DC
  Administered 2017-10-21 – 2017-10-23 (×5): 1.25 mg via ORAL
  Filled 2017-10-21 (×6): qty 0.5

## 2017-10-21 MED ORDER — POTASSIUM CHLORIDE ER 10 MEQ PO TBCR
20.0000 meq | EXTENDED_RELEASE_TABLET | Freq: Every day | ORAL | Status: DC
  Administered 2017-10-22 – 2017-10-23 (×2): 20 meq via ORAL
  Filled 2017-10-21 (×2): qty 2

## 2017-10-21 MED ORDER — ASPIRIN 81 MG PO CHEW
81.0000 mg | CHEWABLE_TABLET | Freq: Every day | ORAL | Status: DC
  Administered 2017-10-22 – 2017-10-23 (×2): 81 mg via ORAL
  Filled 2017-10-21 (×2): qty 1

## 2017-10-21 MED ORDER — INSULIN GLARGINE 100 UNIT/ML ~~LOC~~ SOLN
45.0000 [IU] | Freq: Two times a day (BID) | SUBCUTANEOUS | Status: DC
  Administered 2017-10-21 – 2017-10-23 (×5): 45 [IU] via SUBCUTANEOUS
  Filled 2017-10-21 (×7): qty 0.45

## 2017-10-21 MED ORDER — CRANBERRY 425 MG PO CAPS: 425.0000 mg | ORAL_CAPSULE | Freq: Every day | ORAL | Status: DC

## 2017-10-21 MED ORDER — ACETAMINOPHEN 325 MG PO TABS: 650.0000 mg | ORAL_TABLET | ORAL | Status: DC | PRN

## 2017-10-21 NOTE — ED Notes (Signed)
Pt requested to sit up in chair so assisted to toliet and then chair, moderate assistance needed, she has a walker to use at home.

## 2017-10-21 NOTE — ED Notes (Signed)
Pt sleeping. Pt back in bed.

## 2017-10-21 NOTE — ED Notes (Signed)
Pt sitting up in recliner in no acute distress. Call bell in left hand.

## 2017-10-21 NOTE — ED Provider Notes (Signed)
[facility] Emergency Department Provider Note   ____________________________________________    I have reviewed the triage vital signs and the nursing notes.   HISTORY  Chief Complaint Hyperglycemia     HPI Destiny Cole is a 67 y.o. female who presents with hyperglycemia, reports of hallucinations.  Patient notes that she has been seeing snakes in her room at [facility].  Hallucinations apparently started last night but continued through the night and the patient was also agitated at that time.  No reported history of prior hallucinations.  No reports of fevers or chills.  No dysuria.  No abdominal pain.  Mild cough.  Has been on steroids for rash on the legs.   Past Medical History:  Diagnosis Date  . Cerebral palsy (HCC)   . CHF (congestive heart failure) (HCC)   . Diabetes mellitus without complication (HCC)   . Hypertension     Patient Active Problem List   Diagnosis Date Noted  . GERD (gastroesophageal reflux disease) 10/25/2016  . 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 Facial droop 04/06/2015  . Anxiety state 04/06/2015  . DKA, type 2 (HCC) 04/05/2015  . Hyperlipidemia 04/05/2015  . Physical deconditioning   . Diabetes mellitus without complication (HCC)   . Cerebral palsy (HCC)   . Hypertension     Past Surgical History:  Procedure Laterality Date  . ABDOMINAL HYSTERECTOMY    . cerbral palsy    . MOUTH SURGERY      Prior to Admission medications   Medication Sig Start Date End Date Taking? Authorizing Provider  acetaminophen (TYLENOL) 325 MG tablet Take 650 mg by mouth every 4 (four) hours as needed.    [provider]  Amino Acids-Protein Hydrolys (FEEDING SUPPLEMENT, PRO-STAT SUGAR FREE 64,) LIQD Take 30 mLs by mouth 2 (two) times daily at 10 AM and 5 PM.    [provider]  aspirin 81 MG chewable tablet Chew 81 mg by mouth daily.    [provider]  atorvastatin (LIPITOR) 40  MG tablet Take 40 mg by mouth daily.    [provider]  busPIRone (BUSPAR) 7.5 MG tablet Take 1 tablet (7.5 mg total) by mouth 2 (two) times daily. Patient taking differently: Take 15 mg by mouth 2 (two) times daily.  04/08/15   Richarda Overlie, MD  cefTRIAXone 1 g in dextrose 5 % 50 mL Inject 1 g into the vein daily. 04/24/15   Milagros Loll, MD  Cholecalciferol (VITAMIN D3 PO) Take 1 tablet by mouth daily. 200 units/tablet    [provider]  diphenhydrAMINE (BENADRYL) 25 mg capsule Take 25 mg by mouth as needed.    [provider]  FLUoxetine (PROZAC) 20 MG capsule Take 1 capsule (20 mg total) by mouth daily. 04/08/15   Richarda Overlie, MD  Inositol Niacinate (NO FLUSH NIACIN) 500 MG TABS Take 1 tablet by mouth daily.    [provider]  insulin aspart (NOVOLOG) cartridge Correction coverage: Sensitive (thin, NPO, renal)     CBG < 70: implement hypoglycemia protocol    CBG 70 - 120: 0 units    CBG 121 - 150: 1 unit    CBG 151 - 200: 2 units    CBG 201 - 250: 3 units    CBG 251 - 300: 5 units    CBG 301 - 350: 7 units    CBG 351 - 400 9 units    CBG > 400 call MD and obtain  STAT lab verification 04/08/15   Richarda Overlie, MD  insulin detemir (LEVEMIR) 100 UNIT/ML injection Inject 38 Units into the skin at bedtime.     [provider]  lamoTRIgine (LAMICTAL) 25 MG tablet Take 25 mg by mouth daily.    [provider]  loperamide (IMODIUM) 2 MG capsule Take 1 capsule (2 mg total) by mouth as needed for diarrhea or loose stools. Patient taking differently: Take 4 mg by mouth as needed for diarrhea or loose stools. May repeat with 2mg  up to 8 mg total in 24 hrs 04/24/15   Milagros Loll, MD  LORazepam (ATIVAN) 0.5 MG tablet Take 0.5 mg by mouth every 4 (four) hours as needed for anxiety.    [provider]  magnesium oxide (MAG-OX) 400 MG tablet Take 250 mg by mouth daily.    [provider]  Multiple Vitamin  (MULTIVITAMIN) capsule Take 1 capsule by mouth daily.    [provider]  Neo-Bacit-Poly-Lidocaine (FIRST AID PLUS LIDOCAINE EX) Apply 1 application topically as needed (apply to broken skin areas).    [provider]  Omega-3 Fatty Acids (FISH OIL) 1200 MG CAPS Take 1 capsule by mouth daily.    [provider]  ondansetron (ZOFRAN) 4 MG tablet Take 1 tablet (4 mg total) by mouth every 8 (eight) hours as needed for nausea. 10/30/12   Dow Adolph, MD  oxyCODONE-acetaminophen (PERCOCET/ROXICET) 5-325 MG per tablet Take 1 tablet by mouth every 6 (six) hours as needed for moderate pain. 04/23/15   Milagros Loll, MD  pantoprazole (PROTONIX) 40 MG tablet Take 1 tablet (40 mg total) by mouth daily. 10/30/12   Dow Adolph, MD  potassium chloride (K-DUR) 10 MEQ tablet Take 1 tablet (10 mEq total) by mouth daily. 10/30/12   Dow Adolph, MD  sennosides-docusate sodium (SENOKOT-S) 8.6-50 MG tablet Take 1 tablet by mouth 2 (two) times daily.    [provider]  venlafaxine (EFFEXOR) 75 MG tablet Take 75 mg by mouth 1 day or 1 dose.    [provider]     Allergies Promethazine; Bactrim [sulfamethoxazole-trimethoprim]; and Sulfa antibiotics  Family History  Problem Relation Age of Onset  . Breast cancer Mother   . Hypertension Mother   . Diabetes Mellitus II Maternal Aunt     Social History Social History   Tobacco Use  . Smoking status: Never Smoker  Substance Use Topics  . Alcohol use: No    Alcohol/week: 0.0 oz  . Drug use: No    Review of Systems  Constitutional: No fever Eyes: No visual changes.  ENT: No sore throat. Cardiovascular: Denies chest pain. Respiratory: Mild cough Gastrointestinal: No abdominal pain.  No nausea, no vomiting.   Genitourinary: Negative for dysuria. Musculoskeletal: Negative for joint swelling Skin: Positive rash of the legs Neurological: Negative for headaches    ____________________________________________   PHYSICAL EXAM:  VITAL SIGNS: ED Triage Vitals  Enc Vitals Group     BP 10/21/17 1217 (!) 188/80     Pulse Rate 10/21/17 1217 (!) 116     Resp 10/21/17 1217 20     Temp 10/21/17 1217 98 F (36.7 C)     Temp Source 10/21/17 1217 Oral     SpO2 10/21/17 1217 94 %     Weight 10/21/17 1224 110.7 kg (244 lb)     Height 10/21/17 1224 1.524 m (5')     Head Circumference --      Peak Flow --      Pain  Score --      Pain Loc --      Pain Edu? --      Excl. in GC? --     Constitutional: Alert and oriented. No acute distress. Pleasant and interactive  Nose: No congestion/rhinnorhea. Mouth/Throat: Mucous membranes are moist.    Cardiovascular: Normal rate, regular rhythm. Grossly normal heart sounds.  Good peripheral circulation. Respiratory: Normal respiratory effort.  No retractions. Lungs CTAB. Gastrointestinal: Soft and nontender. No distention.  No CVA tenderness. Genitourinary: deferred Musculoskeletal: No lower extremity tenderness nor edema.  Warm and well perfused Neurologic:  Normal speech and language. No gross focal neurologic deficits are appreciated.  Skin:  Skin is warm, dry and intact.  Rash noted to the lower extremity is primarily and also the right upper extremity, on the lower extremities it  almost appears consistent with contact dermatitis versus drug rash, does not appear infected Psychiatric: Mood and affect are normal. Speech and behavior are normal.  ____________________________________________   LABS (all labs ordered are listed, but only abnormal results are displayed)  Labs Reviewed  GLUCOSE, CAPILLARY - Abnormal; Notable for the following components:      Result Value   Glucose-Capillary >600 (*)    All other components within normal limits  CBC - Abnormal; Notable for the following components:   WBC 12.4 (*)    All other components within normal limits  URINALYSIS, COMPLETE (UACMP) WITH MICROSCOPIC  - Abnormal; Notable for the following components:   Color, Urine STRAW (*)    APPearance CLEAR (*)    Glucose, UA >=500 (*)    Hgb urine dipstick SMALL (*)    Protein, ur 100 (*)    Squamous Epithelial / LPF 0-5 (*)    All other components within normal limits  COMPREHENSIVE METABOLIC PANEL - Abnormal; Notable for the following components:   Sodium 133 (*)    Chloride 98 (*)    Glucose, Bld 599 (*)    BUN 50 (*)    All other components within normal limits  TROPONIN I - Abnormal; Notable for the following components:   Troponin I 0.03 (*)    All other components within normal limits  GLUCOSE, CAPILLARY - Abnormal; Notable for the following components:   Glucose-Capillary 517 (*)    All other components within normal limits  BLOOD GAS, VENOUS  CBG MONITORING, ED   ____________________________________________  EKG  ED ECG REPORT I, Jene Everyobert Alexandre Faries, the attending physician, personally viewed and interpreted this ECG.  Date: 10/21/2017  Rate: 108 Rhythm: Tachycardia QRS Axis: normal Intervals: normal ST/T Wave abnormalities: normal   ____________________________________________  RADIOLOGY  Chest x-ray unremarkable ____________________________________________   PROCEDURES  Procedure(s) performed: No  Procedures   Critical Care performed: No ____________________________________________   INITIAL IMPRESSION / ASSESSMENT AND PLAN / ED COURSE  Pertinent labs & imaging results that were available during my care of the patient were reviewed by me and considered in my medical decision making (see chart for details).  Patient presents with hallucinations of snakes, vital signs significant for tachycardia.  Lab work is significant for hyperglycemia, no DKA.  Urinalysis not consistent with UTI, will treat with IV fluids, insulin subcu.  Symptoms could be related to steroids given hyperglycemia, hallucinations.  We will continue to observe.  Daughter feels the patient  is suffering from mania and is asking for psychiatry consult, telemetry psychiatry consulted.  ----------------------------------------- 3:01 PM on 10/21/2017 -----------------------------------------  Glucose slowly improving, pending psych consult    ____________________________________________  FINAL CLINICAL IMPRESSION(S) / ED DIAGNOSES  Final diagnoses:  Visual hallucination        Note:  This document was prepared using Dragon voice recognition software and may include unintentional dictation errors.    Jene Every, MD 10/21/17 [phone]

## 2017-10-21 NOTE — ED Notes (Signed)
Daughter here and requests that the pt have a psychiatry consult due to her having hallucinations, the pt does state that the roommate next to her had "snakes, cats, dogs and a pony in her room last night", pt is alert and oriented to person, place, date and situation but was seeing things last night and this morning

## 2017-10-21 NOTE — BH Assessment (Signed)
Spoke with patient's Daughter [phone]) and [facility].   Informed them of the process, timeframe and reason for patient been placed at another facility, for Acute Psychiatric  Hospitalization.  Provided them with phone numbers for TTS and ER Sectary in the event family had other questions and wanted updated. As well as informing them of the need of having password and education them as to why it is used. Provided daughter with patient's ER Password.
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
## 2017-10-21 NOTE — ED Notes (Signed)
Patient assisted to the bathroom by this EDT. Patient also moved from recliner to bed and repositioned for comfort. Call bell in reach.

## 2017-10-21 NOTE — ED Notes (Signed)
Pt c/o "itching all over arms and legs", MD notified and orders received, pt also told her daughter that she could "see things crawling out of skin"

## 2017-10-21 NOTE — ED Notes (Signed)
Telepsych monitor moved to room for evaluation.

## 2017-10-21 NOTE — ED Triage Notes (Signed)
[facility] called when the pt was acting confused and had high blood sugar, she has been hallucinating about snakes this morning

## 2017-10-21 NOTE — ED Notes (Signed)
Report from denise, rn. 

## 2017-10-21 NOTE — ED Notes (Signed)
Pt assisted up to commode to void.  

## 2017-10-21 NOTE — ED Notes (Signed)
Attempted to change pt into burgandy scrubs, no scrubs fit pt. Pt placed in grey XXXL hospital gown. Consulting civil engineerCharge RN notified.

## 2017-10-21 NOTE — ED Notes (Signed)
Pt eating, advised of need to change into burgandy scrubs. Will change pt after she finishes eating. Yellow fall band on wrist, yellow non skid socks in place.

## 2017-10-21 NOTE — Progress Notes (Signed)
PHARMACIST - PHYSICIAN ORDER COMMUNICATION  CONCERNING: P&T Medication Policy on Herbal Medications  DESCRIPTION:  This patient's order for:  Cranberry  has been noted.  This product(s) is classified as an "herbal" or natural product. Due to a lack of definitive safety studies or FDA approval, nonstandard manufacturing practices, plus the potential risk of unknown drug-drug interactions while on inpatient medications, the Pharmacy and Therapeutics Committee does not permit the use of "herbal" or natural products of this type within [place].   ACTION TAKEN: The pharmacy department is unable to verify this order at this time and your patient has been informed of this safety policy. Please reevaluate patient's clinical condition at discharge and address if the herbal or natural product(s) should be resumed at that time.   

## 2017-10-21 NOTE — ED Notes (Signed)
Pt states she is not able to sleep in a bed, must sleep in a recliner. Recliner secured with permission of charge rn. Pt assisted up to commode to void. Pt assisted into recliner after voiding, call bell in left hand.

## 2017-10-21 NOTE — ED Provider Notes (Signed)
-----------------------------------------   5:51 PM on 10/21/2017 -----------------------------------------  Patient [facility]. They did recommend IVC and inpatient admission. Also had medication recommendations. Discussed this with the patient and family. Discussed with TTS about helping to find placement. Ordered home medications with SOC recommendations.   Phineas SemenGoodman, Columbia Pandey, MD 10/21/17 2023

## 2017-10-22 LAB — GLUCOSE, CAPILLARY
GLUCOSE-CAPILLARY: 268 mg/dL — AB (ref 65–99)
GLUCOSE-CAPILLARY: 290 mg/dL — AB (ref 65–99)
Glucose-Capillary: 155 mg/dL — ABNORMAL HIGH (ref 65–99)

## 2017-10-22 MED ORDER — POTASSIUM CHLORIDE CRYS ER 20 MEQ PO TBCR
EXTENDED_RELEASE_TABLET | ORAL | Status: AC
  Administered 2017-10-22: 20 meq via ORAL
  Filled 2017-10-22: qty 1

## 2017-10-22 MED ORDER — OLANZAPINE 5 MG PO TABS
ORAL_TABLET | ORAL | Status: AC
  Administered 2017-10-22: 1.25 mg via ORAL
  Filled 2017-10-22: qty 1

## 2017-10-22 NOTE — ED Notes (Signed)
Pt assisted to toilet at this time with 1-person assist; pt with slow, but steady gait. Pt assisted back to recliner after toileting per pt preference. 

## 2017-10-22 NOTE — ED Notes (Signed)
Pt provided lunch tray at this time  

## 2017-10-22 NOTE — BH Assessment (Signed)
Writer called and left a HIPPA Compliant message with Daughter/POA Amil Amen(Julia [phone]), requesting a return phone call. Writer wanting to updated daughter of patient's disposition.

## 2017-10-22 NOTE — ED Notes (Signed)
Pt ambulatory to toilet with one assist.  

## 2017-10-22 NOTE — ED Notes (Signed)
Pt assisted to toilet at this time with 1-person assist; pt with slow, but steady gait. Pt assisted back to recliner after toileting per pt preference.

## 2017-10-22 NOTE — ED Notes (Signed)
Pt assisted up to commode and into recliner.

## 2017-10-22 NOTE — ED Notes (Signed)
Spoke with daughter and updated her. She verified patient password.

## 2017-10-22 NOTE — ED Notes (Signed)
Pt up to chair with one assist, pt provided dinner tray at this time.

## 2017-10-22 NOTE — ED Notes (Signed)
Report to butch, rn.  

## 2017-10-22 NOTE — ED Notes (Signed)
Pt's nightgown, shoes and robe placed in labeled belonging bag at nurse's station near behavioral quad room area.

## 2017-10-22 NOTE — BH Assessment (Signed)
Writer received phone call from patient's Daughter/POA Amil Amen(Julia [phone]) and he updated her on patient's disposition.

## 2017-10-22 NOTE — BH Assessment (Signed)
Referral information for Psychiatric Hospitalization faxed to;    [facility]   [facility]   [facility]   [facility]   [facility]   [facility]   [facility]   [facility]   [facility]

## 2017-10-22 NOTE — ED Notes (Signed)
Ate all breakfast

## 2017-10-22 NOTE — ED Provider Notes (Signed)
-----------------------------------------   5:55 AM on 10/22/2017 -----------------------------------------   Blood pressure (!) 145/87, pulse 100, temperature 98 F (36.7 C), temperature source Oral, resp. rate 20, height 5' (1.524 m), weight 110.7 kg (244 lb), SpO2 96 %.  The patient had no acute events since last update.  Calm and cooperative at this time.  Disposition is pending Psychiatry/Behavioral Medicine team recommendations.    Dionne BucySiadecki, Jadea Shiffer, MD 10/22/17 [phone]

## 2017-10-22 NOTE — ED Notes (Addendum)
Pt ambulated to toilet with one assist.

## 2017-10-22 NOTE — ED Notes (Signed)
Pt's daughter to bedside to visit, wanded in per ODS; ODS at bedside to supervise visit.

## 2017-10-22 NOTE — ED Notes (Signed)
Breakfast tray placed in pt room. Pt sleeping 

## 2017-10-23 DIAGNOSIS — R410 Disorientation, unspecified: Secondary | ICD-10-CM | POA: Diagnosis not present

## 2017-10-23 LAB — GLUCOSE, CAPILLARY
GLUCOSE-CAPILLARY: 191 mg/dL — AB (ref 65–99)
Glucose-Capillary: 139 mg/dL — ABNORMAL HIGH (ref 65–99)
Glucose-Capillary: 203 mg/dL — ABNORMAL HIGH (ref 65–99)

## 2017-10-23 MED ORDER — POTASSIUM CHLORIDE CRYS ER 20 MEQ PO TBCR
EXTENDED_RELEASE_TABLET | ORAL | Status: AC
  Filled 2017-10-23: qty 1

## 2017-10-23 NOTE — ED Notes (Signed)
Pt ambulated to the bedside commode with the help of Dmarion Perfect RN and the Pt's walker. Pt returned to her bed without difficulty.  

## 2017-10-23 NOTE — BH Assessment (Signed)
Writer followed up with referrals;   [facility] ([facility] [phone]), unable to reach anyone.   [facility] [phone]), No beds   [facility] (Melissa-[phone]), Pending review and No beds   [facility] [phone]), patient insurance is out of network and will have to pay an out of pocket copay.   [facility] [phone]), Unable to reach anyone. Left a voicemail message requesting return phone call.   [facility] ([phone]), No beds   [facility] ([phone]),   [facility] (Gerline-[phone]), No beds   [facility] [phone]), Writer refaxed the information and completed verbal screen via phone.   [facility] [phone])

## 2017-10-23 NOTE — ED Notes (Signed)
ED  Is the patient under IVC or is there intent for IVC: Yes.   Is the patient medically cleared: Yes.   Is there vacancy in the ED BHU: Yes.   Is the population mix appropriate for patient: she ambulates with a walker  Is the patient awaiting placement in inpatient or outpatient setting: Yes.   geripsych placement  Has the patient had a psychiatric consult: Yes.   Survey of unit performed for contraband, proper placement and condition of furniture, tampering with fixtures in bathroom, shower, and each patient room: Yes.  ; Findings:  APPEARANCE/BEHAVIOR Calm and cooperative NEURO ASSESSMENT Orientation: oriented x3  Denies pain Hallucinations: No.None noted (Hallucinations) Speech: Normal Gait:  Unsteady at times - she ambulates with a walker  RESPIRATORY ASSESSMENT Even  Unlabored respirations  CARDIOVASCULAR ASSESSMENT Pulses equal   regular rate  Skin warm and dry   GASTROINTESTINAL ASSESSMENT no GI complaint EXTREMITIES Full ROM  PLAN OF CARE Provide calm/safe environment. Vital signs assessed twice daily. ED BHU Assessment once each 12-hour shift. Collaborate with TTS daily or as condition indicates. Assure the ED provider has rounded once each shift. Provide and encourage hygiene. Provide redirection as needed. Assess for escalating behavior; address immediately and inform ED provider.  Assess family dynamic and appropriateness for visitation as needed: Yes.  ; If necessary, describe findings:  Educate the patient/family about BHU procedures/visitation: Yes.  ; If necessary, describe findings:

## 2017-10-23 NOTE — ED Notes (Signed)
IVC removed.

## 2017-10-23 NOTE — ED Notes (Addendum)
Pt's daughter at bedside. Aware that she has 15 minutes to visit with patient. Cleared and wanded by officer.

## 2017-10-23 NOTE — ED Provider Notes (Signed)
Patient was seen by Dr. Toni Amendlapacs. At this time he thinks she is safe to return to [facility]. Patient has not had any significant behavioral disturbances today.    Phineas SemenGoodman, Carrel Leather, MD 10/23/17 [phone]

## 2017-10-23 NOTE — ED Notes (Signed)
EMR clean up

## 2017-10-23 NOTE — ED Provider Notes (Signed)
-----------------------------------------   7:17 AM on 10/23/2017 -----------------------------------------   Blood pressure (!) 159/79, pulse (!) 101, temperature 97.7 F (36.5 C), temperature source Oral, resp. rate 16, height 5' (1.524 m), weight 110.7 kg (244 lb), SpO2 98 %.  The patient had no acute events since last update.  Calm and cooperative at this time.  Disposition is pending Psychiatry/Behavioral Medicine team recommendations.     Jeanmarie PlantMcShane, Malayjah Otoole A, MD 10/23/17 [phone]

## 2017-10-23 NOTE — ED Notes (Signed)
Per daughter and [place] ShiHCPOA, Julia, pt no longer [facility] a [facility]. Daughter picked up pt's things and signed paperwork/paid bills to close account at [facility] on Saturday, after the [facility] conversation pt had. Pt daughter states "one of the female social workers told me [the patient] [facility] another [facility] facility." According to daughter, this is why [facility] was [facility]. Facility stated to her "[the patient] cannot be a bed hold" meaning they would not hold her bed.

## 2017-10-23 NOTE — ED Notes (Signed)
Psychiatry at bedside (Dr. Toni Amendlapacs).

## 2017-10-23 NOTE — ED Notes (Signed)

## 2017-10-23 NOTE — Discharge Instructions (Signed)
Please seek medical attention for any high fevers, chest pain, shortness of breath, change in behavior, persistent vomiting, bloody stool or any other new or concerning symptoms.  

## 2017-10-23 NOTE — ED Notes (Signed)
Pt observed with no unusual behavior  Appropriate to stimulation  She is sitting in the recliner   No verbalized needs or concerns at this time  NAD assessed  Continue to monitor

## 2017-10-23 NOTE — ED Notes (Signed)
BEHAVIORAL HEALTH ROUNDING Patient sleeping: No. Patient alert and oriented: yes Behavior appropriate: Yes.  ; If no, describe:  Nutrition and fluids offered: yes Toileting and hygiene offered: Yes  Sitter present: q15 minute observations and security  monitoring Law enforcement present: Yes  ODS  

## 2017-10-23 NOTE — ED Notes (Signed)
Per Victorino DikeJennifer, [facility]. Would prefer if pt could wait until AM to get there, due to all of pt's belongings being removed from room previously by daughter. [facility] [place] [facility].

## 2017-10-23 NOTE — ED Notes (Signed)
Pt provided with walker upon request, pt ambulates well with walker

## 2017-10-23 NOTE — ED Notes (Signed)
Pt up to toilet ambulatory with walker and then to recliner; watching TV; no needs expressed

## 2017-10-23 NOTE — ED Notes (Addendum)
Pt back in bed and comfortable after sitting up in reclining chair "since 4am." Denying any pain. This RN offered toileting and beverages.

## 2017-10-23 NOTE — ED Notes (Signed)
Amil AmenJulia aware that [facility] in [facility]. Will be here to pick pt up to transport her to the facility.

## 2017-10-23 NOTE — Consult Note (Signed)
[facility] Face-to-Face Psychiatry Consult   Reason for Consult: Consult for 67 year old woman who has been in the emergency room over the weekend. Referring Physician: Archie Balboa Patient Identification: Destiny Cole MRN:  [id number] Principal Diagnosis: Delirium Diagnosis:   Patient Active Problem List   Diagnosis Date Noted  . Delirium [R41.0] 10/23/2017  . GERD (gastroesophageal reflux disease) [K21.9] 10/25/2016  . 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 Facial droop [R29.810] 04/06/2015  . Anxiety state [F41.1] 04/06/2015  . DKA, type 2 ([place]) [E11.10] 04/05/2015  . Hyperlipidemia [E78.5] 04/05/2015  . Physical deconditioning [R53.81]   . Diabetes mellitus without complication ([place]) [W10.2]   . Cerebral palsy ([place]) [G80.9]   . Hypertension [I10]     Total Time spent with patient: 45 minutes  Subjective:   Destiny Cole is a 67 y.o. female patient admitted with "I think my blood sugar was up".  HPI: Patient interviewed chart reviewed.  Spoke with TTS who has also been in contact with the patient's daughter.  50 year old woman was brought here on the 15th from [facility] with reports that she had been confused and having visual hallucinations.  Patient was documented as having said she was seeing snakes.  Since being here in the emergency room patient appears to have been calm and not reporting active psychotic symptoms.  On my interview this afternoon I found the patient easily awakened, calm, cooperative.  She knew she was in the hospital.  Could not remember the name of the facility she had been living in but knew she had been brought here by an ambulance because of her blood sugar.  I reminded her that she had reported seeing snakes and she remembered this but said it had not been happening at all today.  Patient did not seem to be emotionally distressed at all.  Overall quite calm.  Totally denied any suicidal thoughts.  Social history: Closest relative is her  adult daughter.  Patient has recently been living at [facility].  I am told there is been some possible consideration of the patient moving in with the daughter but it does not sound like that was plan for immediately.  Medical history: Cerebral palsy, high blood pressure diabetes  Substance abuse history: None reported  Past Psychiatric History: According to the notes TTS made the patient's daughter says that the patient has had lifelong spells of strange behavior and has reported hallucinations in the past.  It was confirmed that she had at least one psychiatric hospitalization back in the 2s and possibly a suicide attempt.  It sounds like this has not been addressed as an active psychiatric problem more specific diagnosis especially not in recent years.  Risk to Self: Suicidal Ideation: No Suicidal Intent: No Is patient at risk for suicide?: No Suicidal Plan?: No Access to Means: No What has been your use of drugs/alcohol within the last 12 months?: Reports of none How many times?: 1(1976-Intentional Overdose) Other Self Harm Risks: Reports of none Triggers for Past Attempts: None known Intentional Self Injurious Behavior: None Risk to Others: Homicidal Ideation: No Thoughts of Harm to Others: No Current Homicidal Intent: No Current Homicidal Plan: No Access to Homicidal Means: No Identified Victim: Reports of none History of harm to others?: No Assessment of Violence: None Noted Violent Behavior Description: Reports of none Does patient have access to weapons?: No Criminal Charges Pending?: No Does patient have a court date: No Prior Inpatient Therapy: Prior Inpatient Therapy: Yes Prior Therapy Dates: 70  Prior Therapy Facilty/Provider(s): "[name] (currently named [place])" Reason for Treatment: Suicide attempt Prior Outpatient Therapy: Prior Outpatient Therapy: No Does patient have Intensive In-House Services?  : No Does patient have [facility] services? : No Does patient  have P4CC services?: No  Past Medical History:  Past Medical History:  Diagnosis Date  . Cerebral palsy ([place])   . CHF (congestive heart failure) ([place])   . Diabetes mellitus without complication ([place])   . Hypertension     Past Surgical History:  Procedure Laterality Date  . ABDOMINAL HYSTERECTOMY    . cerbral palsy    . MOUTH SURGERY     Family History:  Family History  Problem Relation Age of Onset  . Breast cancer Mother   . Hypertension Mother   . Diabetes Mellitus II Maternal Aunt    Family Psychiatric  History: Unknown Social History:  Social History   Substance and Sexual Activity  Alcohol Use No  . Alcohol/week: 0.0 oz     Social History   Substance and Sexual Activity  Drug Use No    Social History   Socioeconomic History  . Marital status: Married    Spouse name: None  . Number of children: None  . Years of education: None  . Highest education level: None  Social Needs  . Financial resource strain: None  . Food insecurity - worry: None  . Food insecurity - inability: None  . Transportation needs - medical: None  . Transportation needs - non-medical: None  Occupational History  . None  Tobacco Use  . Smoking status: Never Smoker  Substance and Sexual Activity  . Alcohol use: No    Alcohol/week: 0.0 oz  . Drug use: No  . Sexual activity: No  Other Topics Concern  . None  Social History Narrative  . None   Additional Social History:    Allergies:   Allergies  Allergen Reactions  . Promethazine     She has a possible respiratory reaction with this medication with RR 4 and hypoxia a few minutes after administration. Other respiratory depressants like benzos should be used with caution.  . Bactrim [Sulfamethoxazole-Trimethoprim] Itching    And rash  . Sulfa Antibiotics Swelling and Rash    Labs:  Results for orders placed or performed during the hospital encounter of 10/21/17 (from the past 48 hour(s))  Glucose, capillary     Status:  Abnormal   Collection Time: 10/21/17  6:42 PM  Result Value Ref Range   Glucose-Capillary 355 (H) 65 - 99 mg/dL  Glucose, capillary     Status: Abnormal   Collection Time: 10/21/17  9:59 PM  Result Value Ref Range   Glucose-Capillary 366 (H) 65 - 99 mg/dL  Glucose, capillary     Status: Abnormal   Collection Time: 10/22/17  8:16 AM  Result Value Ref Range   Glucose-Capillary 155 (H) 65 - 99 mg/dL  Glucose, capillary     Status: Abnormal   Collection Time: 10/22/17 12:07 PM  Result Value Ref Range   Glucose-Capillary 268 (H) 65 - 99 mg/dL  Glucose, capillary     Status: Abnormal   Collection Time: 10/22/17  5:29 PM  Result Value Ref Range   Glucose-Capillary 290 (H) 65 - 99 mg/dL  Glucose, capillary     Status: Abnormal   Collection Time: 10/23/17  7:33 AM  Result Value Ref Range   Glucose-Capillary 139 (H) 65 - 99 mg/dL  Glucose, capillary     Status: Abnormal  Collection Time: 10/23/17 12:04 PM  Result Value Ref Range   Glucose-Capillary 191 (H) 65 - 99 mg/dL  Glucose, capillary     Status: Abnormal   Collection Time: 10/23/17  4:35 PM  Result Value Ref Range   Glucose-Capillary 203 (H) 65 - 99 mg/dL    Current Facility-Administered Medications  Medication Dose Route Frequency Provider Last Rate Last Dose  . acetaminophen (TYLENOL) tablet 650 mg  650 mg Oral Q4H PRN Nance Pear, MD      . aspirin chewable tablet 81 mg  81 mg Oral QHS Lavonia Drafts, MD   81 mg at 10/22/17 2241  . busPIRone (BUSPAR) tablet 15 mg  15 mg Oral BID Nance Pear, MD   15 mg at 10/23/17 4481  . gabapentin (NEURONTIN) capsule 100 mg  100 mg Oral QHS Nance Pear, MD   100 mg at 10/22/17 2241  . insulin aspart (novoLOG) injection 0-9 Units  0-9 Units Subcutaneous TID WC Nance Pear, MD   3 Units at 10/23/17 1720  . insulin glargine (LANTUS) injection 45 Units  45 Units Subcutaneous BID Nance Pear, MD   45 Units at 10/23/17 [phone]  . LORazepam (ATIVAN) injection 1 mg  1 mg  Intramuscular Q4H PRN Nance Pear, MD      . OLANZapine [facility]) tablet 1.25 mg  1.25 mg Oral BID Nance Pear, MD   1.25 mg at 10/23/17 0834  . potassium chloride (K-DUR) CR tablet 20 mEq  20 mEq Oral QHS Nance Pear, MD   20 mEq at 10/22/17 2242  . venlafaxine XR (EFFEXOR-XR) 24 hr capsule 37.5 mg  37.5 mg Oral Q breakfast Nance Pear, MD   37.5 mg at 10/23/17 1497   Current Outpatient Medications  Medication Sig Dispense Refill  . acetaminophen (TYLENOL) 325 MG tablet Take 650 mg by mouth every 4 (four) hours as needed.    Marland Kitchen aspirin 81 MG chewable tablet Chew 81 mg by mouth daily.    . busPIRone (BUSPAR) 7.5 MG tablet Take 1 tablet (7.5 mg total) by mouth 2 (two) times daily. (Patient taking differently: Take 15 mg by mouth 2 (two) times daily. ) 30 tablet 0  . Cholecalciferol (VITAMIN D3) 50000 units CAPS Take 1 tablet by mouth every 30 (thirty) days.     . Cranberry 425 MG CAPS Take 1 tablet by mouth daily.    . Cyanocobalamin (B-12 COMPLIANCE INJECTION) 1000 MCG/ML KIT Inject 1,000 mcg as directed every 30 (thirty) days.    . diclofenac sodium (VOLTAREN) 1 % GEL Apply 2 g topically 2 (two) times daily.    . diphenhydrAMINE (BENADRYL) 25 mg capsule Take 25 mg by mouth as needed.    . gabapentin (NEURONTIN) 100 MG capsule Take 100 mg by mouth at bedtime.    . hydrOXYzine (ATARAX/VISTARIL) 25 MG tablet Take 25 mg by mouth 3 (three) times daily as needed.    . insulin glargine (LANTUS) 100 UNIT/ML injection Inject 45 Units into the skin 2 (two) times daily.    Marland Kitchen lamoTRIgine 50 MG TBDP Take 100 mg by mouth 2 (two) times daily.     . methylPREDNISolone sodium succinate (SOLU-MEDROL) 125 mg/2 mL injection Inject 125 mg into the vein once.    . miconazole (MICOTIN) 2 % powder Apply topically as needed for itching.    . naproxen sodium (ALEVE) 220 MG tablet Take 220 mg by mouth 2 (two) times daily as needed.    . potassium chloride (K-DUR) 10 MEQ tablet Take 1 tablet (  10 mEq  total) by mouth daily. (Patient taking differently: Take 20 mEq by mouth daily. ) 30 tablet 0  . venlafaxine XR (EFFEXOR-XR) 150 MG 24 hr capsule Take 150 mg by mouth daily with breakfast.    . cefTRIAXone 1 g in dextrose 5 % 50 mL Inject 1 g into the vein daily. (Patient not taking: Reported on 10/21/2017) 12 Units 0  . FLUoxetine (PROZAC) 20 MG capsule Take 1 capsule (20 mg total) by mouth daily. (Patient not taking: Reported on 10/21/2017) 30 capsule 0  . insulin aspart (NOVOLOG) cartridge Correction coverage: Sensitive (thin, NPO, renal)     CBG < 70: implement hypoglycemia protocol    CBG 70 - 120: 0 units    CBG 121 - 150: 1 unit    CBG 151 - 200: 2 units    CBG 201 - 250: 3 units    CBG 251 - 300: 5 units    CBG 301 - 350: 7 units    CBG 351 - 400 9 units    CBG > 400 call MD and obtain STAT lab verification (Patient not taking: Reported on 10/21/2017) 15 mL 1  . loperamide (IMODIUM) 2 MG capsule Take 1 capsule (2 mg total) by mouth as needed for diarrhea or loose stools. (Patient not taking: Reported on 10/21/2017) 30 capsule 0  . ondansetron (ZOFRAN) 4 MG tablet Take 1 tablet (4 mg total) by mouth every 8 (eight) hours as needed for nausea. (Patient not taking: Reported on 10/21/2017) 20 tablet 0  . oxyCODONE-acetaminophen (PERCOCET/ROXICET) 5-325 MG per tablet Take 1 tablet by mouth every 6 (six) hours as needed for moderate pain. (Patient not taking: Reported on 10/21/2017) 30 tablet 0  . pantoprazole (PROTONIX) 40 MG tablet Take 1 tablet (40 mg total) by mouth daily. (Patient not taking: Reported on 10/21/2017) 30 tablet 0    Musculoskeletal: Strength & Muscle Tone: decreased Gait & Station: unsteady Patient leans: N/A  Psychiatric Specialty Exam: Physical Exam  ROS  Blood pressure (!) 151/75, pulse 90, temperature 97.7 F (36.5 C), temperature source Oral, resp. rate 16, height 5' (1.524 m), weight 110.7 kg (244 lb), SpO2 100 %.Body mass index is 47.65 kg/m.   General Appearance: Casual  Eye Contact:  Fair  Speech:  Slow  Volume:  Decreased  Mood:  Euthymic  Affect:  Constricted  Thought Process:  Goal Directed  Orientation:  Full (Time, Place, and Person)  Thought Content:  Tangential  Suicidal Thoughts:  No  Homicidal Thoughts:  No  Memory:  Immediate;   Fair Recent;   Fair Remote;   Fair  Judgement:  Fair  Insight:  Fair  Psychomotor Activity:  Decreased  Concentration:  Concentration: Fair  Recall:  [employer] of Knowledge:  Fair  Language:  Fair  Akathisia:  No  Handed:  Right  AIMS (if indicated):     Assets:  Desire for Improvement Housing Social Support  ADL's:  Impaired  Cognition:  Impaired,  Mild  Sleep:        Treatment Plan Summary: Plan 67 year old woman came into the emergency room with acute visual hallucinations and elevated blood sugar.  Sounds like she was experiencing a transient delirium which may recur when her health is impaired.  Since being in the emergency room blood sugars of stabilized.  Patient is not agitated or acting out or aggressive.  There is no sign of dangerousness.  Does not meet commitment criteria.  Patient is not likely to benefit from  geriatric psychiatry hospitalization and in any case we have gone over 2 days now without any sign of placement.  She has a safe place to stay back at [facility].  My suggestion is that she return there for regular outpatient follow-up.  No need to add any psychiatric medicine at this point.  Discontinue IVC.  Case reviewed with TTS and emergency room physician.  Disposition: No evidence of imminent risk to self or others at present.   Patient does not meet criteria for psychiatric inpatient admission. Supportive therapy provided about ongoing stressors.  Alethia Berthold, MD 10/23/2017 6:29 PM

## 2017-10-23 NOTE — ED Notes (Signed)
Destiny Cole, second shift supervisor at [facility] will contact her director of nursing to figure out what happened with pt's bed. Destiny Cole believes that IF patient was not put in a "bed hold" status that she might have lost the room she was in previously, but that she would still have a bed in the facility. Destiny Cole will call this RN back to clarify "before 10pm."

## 2017-10-24 DIAGNOSIS — R739 Hyperglycemia, unspecified: Secondary | ICD-10-CM | POA: Diagnosis not present

## 2017-10-24 DIAGNOSIS — F05 Delirium due to known physiological condition: Secondary | ICD-10-CM | POA: Diagnosis not present

## 2017-10-24 LAB — GLUCOSE, CAPILLARY: GLUCOSE-CAPILLARY: 115 mg/dL — AB (ref 65–99)

## 2017-10-24 NOTE — NC FL2 (Signed)
[place] LEVEL OF CARE SCREENING TOOL     IDENTIFICATION  Patient Name: Destiny Cole Birthdate: 24-Oct-1950 Sex: female Admission Date (Current Location): 10/21/2017  [place] and [place] Number:  Selena Lesser [id number] [place] and Address:  [facility], [address] [place], [place] [address]      Provider Number: [id number]  Attending Physician Name and Address:  No att. providers found  Relative Name and Phone Number:       Current Level of Care: Hospital Recommended Level of Care: [place] Prior Approval Number:    Date Approved/Denied:   PASRR Number: [id number]  Discharge Plan: SNF    Current Diagnoses: Patient Active Problem List   Diagnosis Date Noted  . Delirium 10/23/2017  . GERD (gastroesophageal reflux disease) 10/25/2016  . 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 Facial droop 04/06/2015  . Anxiety state 04/06/2015  . DKA, type 2 ([place]) 04/05/2015  . Hyperlipidemia 04/05/2015  . Physical deconditioning   . Diabetes mellitus without complication ([place])   . Cerebral palsy ([place])   . Hypertension     Orientation RESPIRATION BLADDER Height & Weight     Self, Time, Situation, Place  Normal Continent Weight: 244 lb (110.7 kg) Height:  5' (152.4 cm)  BEHAVIORAL SYMPTOMS/MOOD NEUROLOGICAL BOWEL NUTRITION STATUS      Continent Diet  AMBULATORY STATUS COMMUNICATION OF NEEDS Skin   Independent Verbally Other (Comment)(Chronic Diabetic Ulcers)                       Personal Care Assistance Level of Assistance  Bathing, Feeding, Dressing Bathing Assistance: Limited assistance Feeding assistance: Independent Dressing Assistance: Limited assistance     Functional Limitations Info  Sight, Hearing, Speech Sight Info: Adequate Hearing Info: Adequate Speech Info: Adequate    SPECIAL CARE FACTORS FREQUENCY                       Contractures Contractures Info: Not  present    Additional Factors Info  Code Status, Allergies, Psychotropic, Insulin Sliding Scale Code Status Info: Full Allergies Info: Promethazine, Bactrim Sulfamethoxazole-trimethoprim, Sulfa Antibiotics Psychotropic Info: See MAR Insulin Sliding Scale Info: See MAR       Current Medications (10/24/2017):  This is the current hospital active medication list Current Facility-Administered Medications  Medication Dose Route Frequency Provider Last Rate Last Dose  . acetaminophen (TYLENOL) tablet 650 mg  650 mg Oral Q4H PRN Nance Pear, MD      . aspirin chewable tablet 81 mg  81 mg Oral QHS Lavonia Drafts, MD   81 mg at 10/23/17 2115  . busPIRone (BUSPAR) tablet 15 mg  15 mg Oral BID Nance Pear, MD   15 mg at 10/23/17 2113  . gabapentin (NEURONTIN) capsule 100 mg  100 mg Oral QHS Nance Pear, MD   100 mg at 10/23/17 2115  . insulin aspart (novoLOG) injection 0-9 Units  0-9 Units Subcutaneous TID WC Nance Pear, MD   Stopped at 10/24/17 [phone]  . insulin glargine (LANTUS) injection 45 Units  45 Units Subcutaneous BID Nance Pear, MD   45 Units at 10/23/17 2118  . LORazepam (ATIVAN) injection 1 mg  1 mg Intramuscular Q4H PRN Nance Pear, MD      . OLANZapine [facility]) tablet 1.25 mg  1.25 mg Oral BID Nance Pear, MD   1.25 mg at 10/23/17 2116  . potassium chloride (K-DUR) CR tablet 20 mEq  20 mEq Oral  Kathrin Penner, MD   20 mEq at 10/23/17 2115  . venlafaxine XR (EFFEXOR-XR) 24 hr capsule 37.5 mg  37.5 mg Oral Q breakfast Nance Pear, MD   37.5 mg at 10/24/17 6045   Current Outpatient Medications  Medication Sig Dispense Refill  . acetaminophen (TYLENOL) 325 MG tablet Take 650 mg by mouth every 4 (four) hours as needed.    Marland Kitchen aspirin 81 MG chewable tablet Chew 81 mg by mouth daily.    . busPIRone (BUSPAR) 7.5 MG tablet Take 1 tablet (7.5 mg total) by mouth 2 (two) times daily. (Patient taking differently: Take 15 mg by mouth 2 (two) times daily. )  30 tablet 0  . Cholecalciferol (VITAMIN D3) 50000 units CAPS Take 1 tablet by mouth every 30 (thirty) days.     . Cranberry 425 MG CAPS Take 1 tablet by mouth daily.    . Cyanocobalamin (B-12 COMPLIANCE INJECTION) 1000 MCG/ML KIT Inject 1,000 mcg as directed every 30 (thirty) days.    . diclofenac sodium (VOLTAREN) 1 % GEL Apply 2 g topically 2 (two) times daily.    . diphenhydrAMINE (BENADRYL) 25 mg capsule Take 25 mg by mouth as needed.    . gabapentin (NEURONTIN) 100 MG capsule Take 100 mg by mouth at bedtime.    . hydrOXYzine (ATARAX/VISTARIL) 25 MG tablet Take 25 mg by mouth 3 (three) times daily as needed.    . insulin glargine (LANTUS) 100 UNIT/ML injection Inject 45 Units into the skin 2 (two) times daily.    Marland Kitchen lamoTRIgine 50 MG TBDP Take 100 mg by mouth 2 (two) times daily.     . methylPREDNISolone sodium succinate (SOLU-MEDROL) 125 mg/2 mL injection Inject 125 mg into the vein once.    . miconazole (MICOTIN) 2 % powder Apply topically as needed for itching.    . naproxen sodium (ALEVE) 220 MG tablet Take 220 mg by mouth 2 (two) times daily as needed.    . potassium chloride (K-DUR) 10 MEQ tablet Take 1 tablet (10 mEq total) by mouth daily. (Patient taking differently: Take 20 mEq by mouth daily. ) 30 tablet 0  . venlafaxine XR (EFFEXOR-XR) 150 MG 24 hr capsule Take 150 mg by mouth daily with breakfast.    . cefTRIAXone 1 g in dextrose 5 % 50 mL Inject 1 g into the vein daily. (Patient not taking: Reported on 10/21/2017) 12 Units 0  . FLUoxetine (PROZAC) 20 MG capsule Take 1 capsule (20 mg total) by mouth daily. (Patient not taking: Reported on 10/21/2017) 30 capsule 0  . insulin aspart (NOVOLOG) cartridge Correction coverage: Sensitive (thin, NPO, renal)     CBG < 70: implement hypoglycemia protocol    CBG 70 - 120: 0 units    CBG 121 - 150: 1 unit    CBG 151 - 200: 2 units    CBG 201 - 250: 3 units    CBG 251 - 300: 5 units    CBG 301 - 350: 7 units    CBG 351 -  400 9 units    CBG > 400 call MD and obtain STAT lab verification (Patient not taking: Reported on 10/21/2017) 15 mL 1  . loperamide (IMODIUM) 2 MG capsule Take 1 capsule (2 mg total) by mouth as needed for diarrhea or loose stools. (Patient not taking: Reported on 10/21/2017) 30 capsule 0  . ondansetron (ZOFRAN) 4 MG tablet Take 1 tablet (4 mg total) by mouth every 8 (eight) hours as needed for nausea. (Patient  not taking: Reported on 10/21/2017) 20 tablet 0  . oxyCODONE-acetaminophen (PERCOCET/ROXICET) 5-325 MG per tablet Take 1 tablet by mouth every 6 (six) hours as needed for moderate pain. (Patient not taking: Reported on 10/21/2017) 30 tablet 0  . pantoprazole (PROTONIX) 40 MG tablet Take 1 tablet (40 mg total) by mouth daily. (Patient not taking: Reported on 10/21/2017) 30 tablet 0     Discharge Medications: Please see discharge summary for a list of discharge medications.  Relevant Imaging Results:  Relevant Lab Results:   Additional Information SSN: [id number]  Truitt Merle, LCSW

## 2017-10-24 NOTE — ED Notes (Signed)
Pt up to bathroom at this time with steady gait noted.

## 2017-10-24 NOTE — ED Notes (Signed)
Pt ambulated to the bedside commode with the help of Roberts GaudyHenry RN and the Pt's walker. Pt returned to her bed without difficulty.

## 2017-10-24 NOTE — ED Notes (Signed)
Pt eating breakfast tray at this time

## 2017-10-24 NOTE — Clinical Social Work Note (Addendum)
CSW consulted for "placement." Per notes, RN already confirmed with Victorino DikeJennifer at [facility] that patient does have a bed available and can return this AM. Per notes, family here to pick up patient for discharge and transport.   CSW received a call [phone]) from [facility] Social Worker Crystal Senaida OresRichardson stating an FL-2 is needed for patient because of being out of the building for an extended period of time. CSW completed and sent signed FL-2 to [facility] via the [facility]. CSW confirmed with Ms. Senaida OresRichardson FL-2 received. CSW signing off as no further Social Work intervention needed.   Destiny Cole, Destiny Cole, Destiny Cole Clinical Social Worker-ED [phone]

## 2017-10-24 NOTE — ED Provider Notes (Signed)
-----------------------------------------   6:05 AM on 10/24/2017 -----------------------------------------   Blood pressure (!) 158/73, pulse 86, temperature 97.7 F (36.5 C), temperature source Oral, resp. rate 18, height 5' (1.524 m), weight 110.7 kg (244 lb), SpO2 100 %.  The patient had no acute events since last update.  Calm and cooperative at this time.  Hopefully patient may [facility] to [facility] this [facility].    Irean HongSung, Jade J, MD 10/24/17 [phone]

## 2017-10-24 NOTE — ED Notes (Signed)
Family here to pick up pt for discharge

## 2017-10-27 DIAGNOSIS — B351 Tinea unguium: Secondary | ICD-10-CM

## 2017-11-01 LAB — BLOOD GAS, VENOUS
Acid-base deficit: 0.4 mmol/L (ref 0.0–2.0)
BICARBONATE: 26.7 mmol/L (ref 20.0–28.0)
PH VEN: 7.31 (ref 7.250–7.430)
Patient temperature: 37
pCO2, Ven: 53 mmHg (ref 44.0–60.0)

## 2017-11-29 DIAGNOSIS — M79641 Pain in right hand: Secondary | ICD-10-CM | POA: Diagnosis not present

## 2017-11-29 DIAGNOSIS — F39 Unspecified mood [affective] disorder: Secondary | ICD-10-CM | POA: Diagnosis not present

## 2017-11-29 DIAGNOSIS — E1140 Type 2 diabetes mellitus with diabetic neuropathy, unspecified: Secondary | ICD-10-CM | POA: Diagnosis not present

## 2017-11-29 DIAGNOSIS — M1990 Unspecified osteoarthritis, unspecified site: Secondary | ICD-10-CM | POA: Diagnosis not present

## 2017-11-29 DIAGNOSIS — G809 Cerebral palsy, unspecified: Secondary | ICD-10-CM | POA: Diagnosis not present

## 2017-12-25 ENCOUNTER — Inpatient Hospital Stay
Admission: EM | Admit: 2017-12-25 | Discharge: 2017-12-27 | DRG: 552 | Disposition: A | Discharge status: Skilled Nursing Facility | Payer: Medicaid Other | Attending: Internal Medicine | Admitting: Internal Medicine

## 2017-12-25 ENCOUNTER — Emergency Department: Payer: Medicaid Other

## 2017-12-25 ENCOUNTER — Other Ambulatory Visit: Payer: Self-pay

## 2017-12-25 ENCOUNTER — Inpatient Hospital Stay: Payer: Medicaid Other

## 2017-12-25 ENCOUNTER — Encounter: Payer: Self-pay | Admitting: Emergency Medicine

## 2017-12-25 DIAGNOSIS — G8929 Other chronic pain: Secondary | ICD-10-CM | POA: Diagnosis present

## 2017-12-25 DIAGNOSIS — Z7982 Long term (current) use of aspirin: Secondary | ICD-10-CM | POA: Diagnosis not present

## 2017-12-25 DIAGNOSIS — Z6841 Body Mass Index (BMI) 40.0 and over, adult: Secondary | ICD-10-CM

## 2017-12-25 DIAGNOSIS — Y92121 Bathroom in nursing home as the place of occurrence of the external cause: Secondary | ICD-10-CM

## 2017-12-25 DIAGNOSIS — Z888 Allergy status to other drugs, medicaments and biological substances status: Secondary | ICD-10-CM

## 2017-12-25 DIAGNOSIS — Z79899 Other long term (current) drug therapy: Secondary | ICD-10-CM | POA: Diagnosis not present

## 2017-12-25 DIAGNOSIS — I4510 Unspecified right bundle-branch block: Secondary | ICD-10-CM | POA: Diagnosis present

## 2017-12-25 DIAGNOSIS — R55 Syncope and collapse: Secondary | ICD-10-CM | POA: Diagnosis present

## 2017-12-25 DIAGNOSIS — R748 Abnormal levels of other serum enzymes: Secondary | ICD-10-CM | POA: Diagnosis present

## 2017-12-25 DIAGNOSIS — Z794 Long term (current) use of insulin: Secondary | ICD-10-CM

## 2017-12-25 DIAGNOSIS — I110 Hypertensive heart disease with heart failure: Secondary | ICD-10-CM | POA: Diagnosis present

## 2017-12-25 DIAGNOSIS — Z882 Allergy status to sulfonamides status: Secondary | ICD-10-CM

## 2017-12-25 DIAGNOSIS — E119 Type 2 diabetes mellitus without complications: Secondary | ICD-10-CM | POA: Diagnosis present

## 2017-12-25 DIAGNOSIS — W1830XA Fall on same level, unspecified, initial encounter: Secondary | ICD-10-CM | POA: Diagnosis present

## 2017-12-25 DIAGNOSIS — M545 Low back pain: Principal | ICD-10-CM | POA: Diagnosis present

## 2017-12-25 DIAGNOSIS — I5030 Unspecified diastolic (congestive) heart failure: Secondary | ICD-10-CM | POA: Diagnosis present

## 2017-12-25 DIAGNOSIS — R402413 Glasgow coma scale score 13-15, at hospital admission: Secondary | ICD-10-CM | POA: Diagnosis present

## 2017-12-25 DIAGNOSIS — K219 Gastro-esophageal reflux disease without esophagitis: Secondary | ICD-10-CM | POA: Diagnosis present

## 2017-12-25 DIAGNOSIS — R7989 Other specified abnormal findings of blood chemistry: Secondary | ICD-10-CM

## 2017-12-25 DIAGNOSIS — G809 Cerebral palsy, unspecified: Secondary | ICD-10-CM | POA: Diagnosis present

## 2017-12-25 DIAGNOSIS — F259 Schizoaffective disorder, unspecified: Secondary | ICD-10-CM | POA: Diagnosis present

## 2017-12-25 DIAGNOSIS — L304 Erythema intertrigo: Secondary | ICD-10-CM | POA: Diagnosis present

## 2017-12-25 DIAGNOSIS — E669 Obesity, unspecified: Secondary | ICD-10-CM | POA: Diagnosis present

## 2017-12-25 DIAGNOSIS — Y9301 Activity, walking, marching and hiking: Secondary | ICD-10-CM | POA: Diagnosis present

## 2017-12-25 DIAGNOSIS — R778 Other specified abnormalities of plasma proteins: Secondary | ICD-10-CM

## 2017-12-25 DIAGNOSIS — L089 Local infection of the skin and subcutaneous tissue, unspecified: Secondary | ICD-10-CM | POA: Diagnosis present

## 2017-12-25 DIAGNOSIS — F250 Schizoaffective disorder, bipolar type: Secondary | ICD-10-CM | POA: Diagnosis not present

## 2017-12-25 LAB — COMPREHENSIVE METABOLIC PANEL
ALK PHOS: 90 U/L (ref 38–126)
ALT: 26 U/L (ref 14–54)
ANION GAP: 9 (ref 5–15)
AST: 29 U/L (ref 15–41)
Albumin: 3.4 g/dL — ABNORMAL LOW (ref 3.5–5.0)
BILIRUBIN TOTAL: 0.5 mg/dL (ref 0.3–1.2)
BUN: 28 mg/dL — ABNORMAL HIGH (ref 6–20)
CO2: 25 mmol/L (ref 22–32)
CREATININE: 0.7 mg/dL (ref 0.44–1.00)
Calcium: 9 mg/dL (ref 8.9–10.3)
Chloride: 102 mmol/L (ref 101–111)
Glucose, Bld: 190 mg/dL — ABNORMAL HIGH (ref 65–99)
Potassium: 4.2 mmol/L (ref 3.5–5.1)
SODIUM: 136 mmol/L (ref 135–145)
Total Protein: 7.2 g/dL (ref 6.5–8.1)

## 2017-12-25 LAB — URINALYSIS, COMPLETE (UACMP) WITH MICROSCOPIC
Bilirubin Urine: NEGATIVE
GLUCOSE, UA: 150 mg/dL — AB
Ketones, ur: NEGATIVE mg/dL
LEUKOCYTES UA: NEGATIVE
NITRITE: NEGATIVE
PH: 7 (ref 5.0–8.0)
Protein, ur: 100 mg/dL — AB
SPECIFIC GRAVITY, URINE: 1.014 (ref 1.005–1.030)

## 2017-12-25 LAB — CBC
HEMATOCRIT: 37.6 % (ref 35.0–47.0)
HEMOGLOBIN: 12.8 g/dL (ref 12.0–16.0)
MCH: 31.7 pg (ref 26.0–34.0)
MCHC: 34 g/dL (ref 32.0–36.0)
MCV: 93.4 fL (ref 80.0–100.0)
Platelets: 381 10*3/uL (ref 150–440)
RBC: 4.02 MIL/uL (ref 3.80–5.20)
RDW: 13.9 % (ref 11.5–14.5)
WBC: 11.2 10*3/uL — ABNORMAL HIGH (ref 3.6–11.0)

## 2017-12-25 LAB — TROPONIN I
TROPONIN I: 0.2 ng/mL — AB (ref <0.03)
Troponin I: 0.19 ng/mL (ref <0.03)
Troponin I: 0.25 ng/mL (ref <0.03)

## 2017-12-25 LAB — GLUCOSE, CAPILLARY
GLUCOSE-CAPILLARY: 174 mg/dL — AB (ref 65–99)
GLUCOSE-CAPILLARY: 118 mg/dL — AB (ref 65–99)

## 2017-12-25 LAB — MRSA PCR SCREENING: MRSA BY PCR: NEGATIVE

## 2017-12-25 MED ORDER — ACETAMINOPHEN 500 MG PO TABS
1000.0000 mg | ORAL_TABLET | Freq: Once | ORAL | Status: AC
  Administered 2017-12-25: 1000 mg via ORAL
  Filled 2017-12-25: qty 2

## 2017-12-25 MED ORDER — SODIUM CHLORIDE 0.9 % IV SOLN
3.0000 g | Freq: Four times a day (QID) | INTRAVENOUS | Status: DC
  Administered 2017-12-25 – 2017-12-27 (×8): 3 g via INTRAVENOUS
  Filled 2017-12-25 (×12): qty 3

## 2017-12-25 MED ORDER — INSULIN ASPART 100 UNIT/ML ~~LOC~~ SOLN
0.0000 [IU] | Freq: Three times a day (TID) | SUBCUTANEOUS | Status: DC
  Administered 2017-12-26: 2 [IU] via SUBCUTANEOUS
  Administered 2017-12-27: 1 [IU] via SUBCUTANEOUS
  Filled 2017-12-25 (×2): qty 1

## 2017-12-25 MED ORDER — ACETAMINOPHEN 325 MG PO TABS: 650.0000 mg | ORAL_TABLET | ORAL | Status: DC | PRN

## 2017-12-25 MED ORDER — GABAPENTIN 100 MG PO CAPS
100.0000 mg | ORAL_CAPSULE | Freq: Every day | ORAL | Status: DC
  Administered 2017-12-25 – 2017-12-26 (×2): 100 mg via ORAL
  Filled 2017-12-25 (×2): qty 1

## 2017-12-25 MED ORDER — LAMOTRIGINE 100 MG PO TABS
100.0000 mg | ORAL_TABLET | Freq: Two times a day (BID) | ORAL | Status: DC
  Administered 2017-12-25 – 2017-12-27 (×4): 100 mg via ORAL
  Filled 2017-12-25 (×4): qty 1

## 2017-12-25 MED ORDER — ASPIRIN 81 MG PO CHEW
324.0000 mg | CHEWABLE_TABLET | Freq: Once | ORAL | Status: AC
  Administered 2017-12-25: 324 mg via ORAL
  Filled 2017-12-25: qty 4

## 2017-12-25 MED ORDER — VENLAFAXINE HCL ER 150 MG PO CP24
150.0000 mg | ORAL_CAPSULE | Freq: Every day | ORAL | Status: DC
  Administered 2017-12-26 – 2017-12-27 (×2): 150 mg via ORAL
  Filled 2017-12-25 (×2): qty 1
  Filled 2017-12-25 (×2): qty 2

## 2017-12-25 MED ORDER — ASPIRIN 81 MG PO CHEW
81.0000 mg | CHEWABLE_TABLET | Freq: Every day | ORAL | Status: DC
  Administered 2017-12-26 – 2017-12-27 (×2): 81 mg via ORAL
  Filled 2017-12-25 (×2): qty 1

## 2017-12-25 MED ORDER — PNEUMOCOCCAL VAC POLYVALENT 25 MCG/0.5ML IJ INJ: 0.5000 mL | INJECTION | INTRAMUSCULAR | Status: DC

## 2017-12-25 MED ORDER — HYDROCORTISONE 2.5 % EX CREA
1.0000 "application " | TOPICAL_CREAM | Freq: Three times a day (TID) | CUTANEOUS | Status: DC | PRN
  Filled 2017-12-25: qty 30

## 2017-12-25 MED ORDER — BUSPIRONE HCL 15 MG PO TABS
7.5000 mg | ORAL_TABLET | Freq: Two times a day (BID) | ORAL | Status: DC
  Administered 2017-12-25 – 2017-12-27 (×4): 7.5 mg via ORAL
  Filled 2017-12-25 (×5): qty 1

## 2017-12-25 MED ORDER — METFORMIN HCL 500 MG PO TABS
1000.0000 mg | ORAL_TABLET | Freq: Two times a day (BID) | ORAL | Status: DC
  Administered 2017-12-26 – 2017-12-27 (×3): 1000 mg via ORAL
  Filled 2017-12-25 (×3): qty 2

## 2017-12-25 MED ORDER — OXYCODONE-ACETAMINOPHEN 5-325 MG PO TABS
1.0000 | ORAL_TABLET | Freq: Four times a day (QID) | ORAL | Status: DC | PRN
  Administered 2017-12-25 – 2017-12-27 (×3): 1 via ORAL
  Filled 2017-12-25 (×3): qty 1

## 2017-12-25 MED ORDER — HYDROXYZINE HCL 25 MG PO TABS
25.0000 mg | ORAL_TABLET | Freq: Four times a day (QID) | ORAL | Status: DC | PRN
  Administered 2017-12-26: 25 mg via ORAL
  Filled 2017-12-25: qty 1

## 2017-12-25 MED ORDER — DOCUSATE SODIUM 100 MG PO CAPS: 100.0000 mg | ORAL_CAPSULE | Freq: Two times a day (BID) | ORAL | Status: DC | PRN

## 2017-12-25 MED ORDER — SODIUM CHLORIDE 0.9 % IV SOLN
Freq: Once | INTRAVENOUS | Status: AC
  Administered 2017-12-25: 14:00:00 via INTRAVENOUS

## 2017-12-25 MED ORDER — INSULIN GLARGINE 100 UNIT/ML ~~LOC~~ SOLN
35.0000 [IU] | Freq: Two times a day (BID) | SUBCUTANEOUS | Status: DC
  Administered 2017-12-26 – 2017-12-27 (×3): 35 [IU] via SUBCUTANEOUS
  Filled 2017-12-25 (×6): qty 0.35

## 2017-12-25 MED ORDER — BUSPIRONE HCL 15 MG PO TABS
7.5000 mg | ORAL_TABLET | Freq: Two times a day (BID) | ORAL | Status: DC
  Filled 2017-12-25: qty 1.5

## 2017-12-25 MED ORDER — HEPARIN SODIUM (PORCINE) 5000 UNIT/ML IJ SOLN
5000.0000 [IU] | Freq: Three times a day (TID) | INTRAMUSCULAR | Status: DC
  Administered 2017-12-25 – 2017-12-27 (×4): 5000 [IU] via SUBCUTANEOUS
  Filled 2017-12-25 (×4): qty 1

## 2017-12-25 MED ORDER — MICONAZOLE NITRATE 2 % EX POWD
Freq: Two times a day (BID) | CUTANEOUS | Status: DC | PRN
  Filled 2017-12-25: qty 43

## 2017-12-25 MED ORDER — VITAMIN D3 25 MCG (1000 UNIT) PO TABS: 5000.0000 [IU] | ORAL_TABLET | ORAL | Status: DC

## 2017-12-25 MED ORDER — PANTOPRAZOLE SODIUM 40 MG PO TBEC
40.0000 mg | DELAYED_RELEASE_TABLET | Freq: Every day | ORAL | Status: DC
  Administered 2017-12-26 – 2017-12-27 (×2): 40 mg via ORAL
  Filled 2017-12-25 (×2): qty 1

## 2017-12-25 NOTE — ED Notes (Signed)
ED Provider at bedside. 

## 2017-12-25 NOTE — H&P (Signed)
[facility] NAME: Destiny Cole    MR#:  [id number]  DATE OF BIRTH:  22-Apr-1951  DATE OF ADMISSION:  12/25/2017  PRIMARY CARE PHYSICIAN: Venia Carbon, MD   REQUESTING/REFERRING PHYSICIAN: Gwyndolyn Saxon  CHIEF COMPLAINT:   Chief Complaint  Patient presents with  . Loss of Consciousness    HISTORY OF PRESENT ILLNESS: Destiny Cole  is a 67 y.o. female with a known history of cerebral palsy, CHF, diabetes, hypertension, obesity- lives in [facility] for last 1 year and walks with a walker over there. Today while walking from the bathroom with walker had suddenly her right leg gave away and she fell down, she passed out after she fell andregained her consciousness soon and try to get up but could not and passed out again. Finally with these complaints she was brought to emergency room. She was alert and oriented in ER without much complaint except for lower back pain which showed no fracture on x-ray. Noted to have elevated troponin in ER. So she is given to admit him to hospitalist team for further management.  PAST MEDICAL HISTORY:   Past Medical History:  Diagnosis Date  . Cerebral palsy ([place])   . CHF (congestive heart failure) ([place])   . Diabetes mellitus without complication ([place])   . Hypertension     PAST SURGICAL HISTORY:  Past Surgical History:  Procedure Laterality Date  . ABDOMINAL HYSTERECTOMY    . cerbral palsy    . MOUTH SURGERY      SOCIAL HISTORY:  Social History   Tobacco Use  . Smoking status: Never Smoker  Substance Use Topics  . Alcohol use: No    Alcohol/week: 0.0 oz    FAMILY HISTORY:  Family History  Problem Relation Age of Onset  . Breast cancer Mother   . Hypertension Mother   . Diabetes Mellitus II Maternal Aunt     DRUG ALLERGIES:  Allergies  Allergen Reactions  . Promethazine     She has a possible respiratory reaction with this medication with RR 4 and hypoxia a few  minutes after administration. Other respiratory depressants like benzos should be used with caution.  . Bactrim [Sulfamethoxazole-Trimethoprim] Itching    And rash  . Sulfa Antibiotics Swelling and Rash    REVIEW OF SYSTEMS:   CONSTITUTIONAL: No fever, positive for fatigue or weakness.  EYES: No blurred or double vision.  EARS, NOSE, AND THROAT: No tinnitus or ear pain.  RESPIRATORY: No cough, shortness of breath, wheezing or hemoptysis.  CARDIOVASCULAR: No chest pain, orthopnea, edema.  GASTROINTESTINAL: No nausea, vomiting, diarrhea or abdominal pain.  GENITOURINARY: No dysuria, hematuria.  ENDOCRINE: No polyuria, nocturia,  HEMATOLOGY: No anemia, easy bruising or bleeding SKIN: No rash or lesion. MUSCULOSKELETAL: No joint pain or arthritis.  She has lower back pain. NEUROLOGIC: No tingling, numbness, weakness.  PSYCHIATRY: No anxiety or depression.   MEDICATIONS AT HOME:  Prior to Admission medications   Medication Sig Start Date End Date Taking? Authorizing Provider  acetaminophen (TYLENOL) 325 MG tablet Take 650 mg by mouth every 4 (four) hours as needed for mild pain or fever.    Yes [provider]  aspirin 81 MG chewable tablet Chew 81 mg by mouth daily.   Yes [provider]  busPIRone (BUSPAR) 7.5 MG tablet Take 1 tablet (7.5 mg total) by mouth 2 (two) times daily. Patient taking differently: Take 15 mg by mouth 2 (two) times daily.  04/08/15  Yes Reyne Dumas, MD  Cholecalciferol (VITAMIN D3) 50000 units CAPS Take 1 tablet by mouth every 30 (thirty) days. On the 24th.   Yes [provider]  Cranberry 425 MG CAPS Take 1 tablet by mouth daily.   Yes [provider]  Cyanocobalamin (B-12 COMPLIANCE INJECTION) 1000 MCG/ML KIT Inject 1,000 mcg as directed every 30 (thirty) days.   Yes [provider]  diclofenac sodium (VOLTAREN) 1 % GEL Apply 2 g topically 2 (two) times daily. Apply to knees and right hand.   Yes [provider]  fluconazole (DIFLUCAN) 100 MG tablet Take 100 mg by mouth See admin instructions. Take 100 mg by mouth for 7 days. This may be used monthly for 1 week as needed for yeast reoccurrence.   Yes [provider]  gabapentin (NEURONTIN) 100 MG capsule Take 100 mg by mouth at bedtime.   Yes [provider]  hydrocortisone 2.5 % cream Apply 1 application topically 3 (three) times daily as needed (rash).   Yes [provider]  hydrOXYzine (ATARAX/VISTARIL) 25 MG tablet Take 25 mg by mouth every 6 (six) hours as needed for itching.    Yes [provider]  insulin glargine (LANTUS) 100 UNIT/ML injection Inject 45 Units into the skin 2 (two) times daily.   Yes [provider]  lamoTRIgine (LAMICTAL) 100 MG tablet Take 100 mg by mouth every 12 (twelve) hours.    Yes [provider]  metFORMIN (GLUCOPHAGE) 1000 MG tablet Take 1,000 mg by mouth 2 (two) times daily with a meal.   Yes [provider]  miconazole (MICOTIN) 2 % powder Apply topically daily as needed for itching. Apply under abdomen in folds and red areas daily as needed.   Yes [provider]  naproxen sodium (ALEVE) 220 MG tablet Take 220 mg by mouth 2 (two) times daily as needed (pain).    Yes [provider]  potassium chloride (K-DUR) 10 MEQ tablet Take 1 tablet (10 mEq total) by mouth daily. Patient taking differently: Take 20 mEq by mouth daily.  10/30/12  Yes Jessee Avers, MD  venlafaxine XR (EFFEXOR-XR) 150 MG 24 hr capsule Take 150 mg by mouth daily with breakfast.   Yes [provider]  FLUoxetine (PROZAC) 20 MG capsule Take 1 capsule (20 mg total) by mouth daily. Patient not taking: Reported on 10/21/2017 04/08/15   Reyne Dumas, MD  insulin aspart (NOVOLOG) cartridge Correction coverage: Sensitive (thin, NPO, renal)     CBG < 70: implement hypoglycemia protocol    CBG 70 - 120: 0 units    CBG 121 - 150: 1 unit    CBG 151 -  200: 2 units    CBG 201 - 250: 3 units    CBG 251 - 300: 5 units    CBG 301 - 350: 7 units    CBG 351 - 400 9 units    CBG > 400 call MD and obtain STAT lab verification Patient not taking: Reported on 10/21/2017 04/08/15   Reyne Dumas, MD  loperamide (IMODIUM) 2 MG capsule Take 1 capsule (2 mg total) by mouth as needed for diarrhea or loose stools. Patient not taking: Reported on 10/21/2017 04/24/15   Hillary Bow, MD  ondansetron (ZOFRAN) 4 MG tablet Take 1 tablet (4 mg total) by mouth every 8 (eight) hours as needed for nausea. Patient not taking: Reported on 10/21/2017 10/30/12   Jessee Avers, MD  oxyCODONE-acetaminophen (PERCOCET/ROXICET) 5-325 MG per tablet Take 1  tablet by mouth every 6 (six) hours as needed for moderate pain. Patient not taking: Reported on 10/21/2017 04/23/15   Hillary Bow, MD  pantoprazole (PROTONIX) 40 MG tablet Take 1 tablet (40 mg total) by mouth daily. Patient not taking: Reported on 10/21/2017 10/30/12   Jessee Avers, MD      PHYSICAL EXAMINATION:   VITAL SIGNS: Blood pressure (!) 159/77, pulse 100, temperature 97.7 F (36.5 C), temperature source Oral, resp. rate 10, height 5' (1.524 m), weight 110.7 kg (244 lb), SpO2 94 %.  GENERAL:  67 y.o.-year-old patient lying in the bed with no acute distress.  EYES: Pupils equal, round, reactive to light and accommodation. No scleral icterus. Extraocular muscles intact.  HEENT: Head atraumatic, normocephalic. Oropharynx and nasopharynx clear.  NECK:  Supple, no jugular venous distention. No thyroid enlargement, no tenderness.  LUNGS: Normal breath sounds bilaterally, no wheezing, rales,rhonchi or crepitation. No use of accessory muscles of respiration.  CARDIOVASCULAR: S1, S2 normal. No murmurs, rubs, or gallops.  ABDOMEN: Soft, nontender, nondistended. Bowel sounds present. No organomegaly or mass.  EXTREMITIES: No pedal edema, cyanosis, or clubbing.  NEUROLOGIC: Cranial nerves II through XII are  intact. Muscle strength 5/5 in all extremities. Sensation intact. Gait not checked.  PSYCHIATRIC: The patient is alert and oriented x 3.  SKIN: redness with some blisters on right leg, left side abdominal wall has a quarter size chronically looking indurated ulcer with redness surrounding, left forearm have a small 2 cm x 0.5 cm size ulcer which also appears dry and chronic looking with some redness surrounding the skin.   LABORATORY PANEL:   CBC Recent Labs  Lab 12/25/17 1115  WBC 11.2*  HGB 12.8  HCT 37.6  PLT 381  MCV 93.4  MCH 31.7  MCHC 34.0  RDW 13.9   ------------------------------------------------------------------------------------------------------------------  Chemistries  Recent Labs  Lab 12/25/17 1115  NA 136  K 4.2  CL 102  CO2 25  GLUCOSE 190*  BUN 28*  CREATININE 0.70  CALCIUM 9.0  AST 29  ALT 26  ALKPHOS 90  BILITOT 0.5   ------------------------------------------------------------------------------------------------------------------ estimated creatinine clearance is 80.3 mL/min (by C-G formula based on SCr of 0.7 mg/dL). ------------------------------------------------------------------------------------------------------------------ No results for input(s): TSH, T4TOTAL, T3FREE, THYROIDAB in the last 72 hours.  Invalid input(s): FREET3   Coagulation profile No results for input(s): INR, PROTIME in the last 168 hours. ------------------------------------------------------------------------------------------------------------------- No results for input(s): DDIMER in the last 72 hours. -------------------------------------------------------------------------------------------------------------------  Cardiac Enzymes Recent Labs  Lab 12/25/17 1115  TROPONINI 0.20*   ------------------------------------------------------------------------------------------------------------------ Invalid input(s):  POCBNP  ---------------------------------------------------------------------------------------------------------------  Urinalysis    Component Value Date/Time   COLORURINE STRAW (A) 10/21/2017 1247   APPEARANCEUR CLEAR (A) 10/21/2017 1247   LABSPEC 1.022 10/21/2017 1247   PHURINE 5.0 10/21/2017 1247   GLUCOSEU >=500 (A) 10/21/2017 1247   HGBUR SMALL (A) 10/21/2017 1247   BILIRUBINUR NEGATIVE 10/21/2017 1247   KETONESUR NEGATIVE 10/21/2017 1247   PROTEINUR 100 (A) 10/21/2017 1247   UROBILINOGEN 1.0 04/05/2015 1712   NITRITE NEGATIVE 10/21/2017 1247   LEUKOCYTESUR NEGATIVE 10/21/2017 1247     RADIOLOGY: Dg Chest 2 View  Result Date: 12/25/2017 CLINICAL DATA:  Fall. EXAM: CHEST - 2 VIEW COMPARISON:  Radiograph of October 21, 2017. FINDINGS: Stable cardiomegaly. No pneumothorax or pleural effusion is noted. Both lungs are clear. The visualized skeletal structures are unremarkable. IMPRESSION: No active cardiopulmonary disease. Electronically Signed   By: Marijo Conception, M.D.   On: 12/25/2017 14:56   Dg Lumbar Spine 2-3  Views  Result Date: 12/25/2017 CLINICAL DATA:  Fall with low back pain. EXAM: LUMBAR SPINE - 2-3 VIEW COMPARISON:  07/08/2015 FINDINGS: No evidence of acute fracture or traumatic malalignment. Lower lumbar facet spurring with slight L4-5 anterolisthesis. Osteopenia and arterial calcification. IMPRESSION: No acute finding. Electronically Signed   By: Monte Fantasia M.D.   On: 12/25/2017 14:54    EKG: Orders placed or performed during the hospital encounter of 12/25/17  . EKG 12-Lead  . EKG 12-Lead  . ED EKG  . ED EKG    IMPRESSION AND PLAN:  * syncopal episode   Monitor on telemetry and follow serial troponin.  Get echocardiogram.   Urinalysis and CT head is not checked yet, I have ordered, please follow up the results.   Check orthostatic vitals.  * elevated troponin   This could be reactive due to stress, follow serial troponin and if continued to rise  then she may need to have active management of CAD.   Check her echocardiogram and get cardiology consult.  * Multiple skin lesions   He has skin lesions on her right leg, left side of the abdominal wall, left forearm   Will give IV Unasyn for now and check for MRSA.  * diabetes   Continue lantus and oral medication and keep on sliding scale coverage.  * lower back pain   X-ray of lumbar spine is without acute abnormality, continue her home oxycodone as she is taking.  * gastroesophageal reflux disease   Continue pantoprazole.     All the records are reviewed and case discussed with ED provider. Management plans discussed with the patient, family and they are in agreement.  CODE STATUS: Limited. Code Status History    Date Active Date Inactive Code Status Order ID Comments User Context   05/08/2015 1533 05/09/2015 0319 Full Code [id number]  Sabino Dick, MD HOV   04/19/2015 0516 04/24/2015 1540 DNR [id number]  Harrie Foreman, MD Inpatient   04/05/2015 2141 04/08/2015 2122 Partial Code [id number]  Rise Patience, MD ED   10/29/2012 1152 10/30/2012 1945 Full Code [id number]  Jessee Avers, MD Inpatient   10/29/2012 0053 10/29/2012 1152 Partial Code [id number]  Neta Ehlers, MD Inpatient       TOTAL TIME TAKING CARE OF THIS PATIENT: 50 minutes.    Vaughan Basta M.D on 12/25/2017   Between 7am to 6pm - Pager - [phone]  After 6pm go to [email or web address] - password [email or web address] [facility]  Office  [phone]  CC: Primary care physician; Venia Carbon, MD   Note: This dictation was prepared with Dragon dictation along with smaller phrase technology. Any transcriptional errors that result from this process are unintentional.

## 2017-12-25 NOTE — ED Triage Notes (Signed)
Says from [facility].  Says she went to come back from bathroom and passed out. Says she fell on back and hit her head, but doesn't know if she got knocked out from the fall because she was already passed otu.  Patient is alert now. She is in a wheelchair.

## 2017-12-25 NOTE — ED Notes (Signed)
Pt was repositioned on the bed, purwick was placed for voiding needs, and pt is in bed with rails upx2 on monitor. We will continue to monitor the pt.

## 2017-12-25 NOTE — ED Notes (Signed)
Pt going to CT

## 2017-12-25 NOTE — Progress Notes (Signed)
Family Meeting Note  Advance Directive:yes  Today a meeting took place with the Patient.  The following clinical team members were present during this meeting:MD  The following were discussed:Patient's diagnosis: elevated troponin, Syncopal episode, Patient's progosis: Unable to determine and Goals for treatment: Continue present management  She confirms that in an acute event she would like to have CPR, defibrillator use, medication use, but does not like to have intubation or ventilator.  Additional follow-up to be provided: Cardiology  Time spent during discussion:20 minutes  Altamese Dilling, MD

## 2017-12-25 NOTE — Progress Notes (Signed)
Pharmacy Antibiotic Note  Destiny Cole is a 67 y.o. female admitted on 12/25/2017 with cellulitis.  Pharmacy has been consulted for unasyn dosing.  Plan: unasyn 3gm iv q6h  Height: 5' (152.4 cm) Weight: 244 lb (110.7 kg) IBW/kg (Calculated) : 45.5  Temp (24hrs), Avg:97.7 F (36.5 C), Min:97.7 F (36.5 C), Max:97.7 F (36.5 C)  Recent Labs  Lab 12/25/17 1115  WBC 11.2*  CREATININE 0.70    Estimated Creatinine Clearance: 80.3 mL/min (by C-G formula based on SCr of 0.7 mg/dL).    Allergies  Allergen Reactions  . Promethazine     She has a possible respiratory reaction with this medication with RR 4 and hypoxia a few minutes after administration. Other respiratory depressants like benzos should be used with caution.  . Bactrim [Sulfamethoxazole-Trimethoprim] Itching    And rash  . Sulfa Antibiotics Swelling and Rash    Antimicrobials this admission: Anti-infectives (From admission, onward)   Start     Dose/Rate Route Frequency Ordered Stop   12/25/17 1700  Ampicillin-Sulbactam (UNASYN) 3 g in sodium chloride 0.9 % 100 mL IVPB     3 g 200 mL/hr over 30 Minutes Intravenous Every 6 hours 12/25/17 1652         Microbiology results: No results found for this or any previous visit (from the past 240 hour(s)).   Thank you for allowing pharmacy to be a part of this patient's care.  Gerre Pebbles Lyndall Bellot 12/25/2017 4:52 PM

## 2017-12-25 NOTE — ED Provider Notes (Signed)
[facility] Emergency Department Provider Note       Time seen: ----------------------------------------- 1:36 PM on 12/25/2017 -----------------------------------------   I have reviewed the triage vital signs and the nursing notes.  HISTORY   Chief Complaint Loss of Consciousness    HPI Destiny Cole is a 67 y.o. female with a history of cerebral palsy, CHF, diabetes and hypertension who presents to the ED for 2 syncopal events that occurred at [facility]  Patient states she was coming back from the bathroom when she passed out.  She fell on her back and hit her head.  She is not having any headache but does complain of some low back pain.  She denies any chest pain or difficulty breathing, denies any recent illness or possibility of dehydration.  Past Medical History:  Diagnosis Date  . Cerebral palsy (H[facility])   . CHF (congestive heart failure) ([facility])   . Diabetes mellitus without complication ([facility])   . Hypertension     Patient Active Problem List   Diagnosis Date Noted  . Delirium 10/23/2017  . GERD (gastroesophageal reflux disease) 10/25/2016  . 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 Facial droop 04/06/2015  . Anxiety state 04/06/2015  . DKA, type 2 (HCC) 04/05/2015  . Hyperlipidemia 04/05/2015  . Physical deconditioning   . Diabetes mellitus without complication (HCC)   . Cerebral palsy (HCC)   . Hypertension     Past Surgical History:  Procedure Laterality Date  . ABDOMINAL HYSTERECTOMY    . cerbral palsy    . MOUTH SURGERY      Allergies Promethazine; Bactrim [sulfamethoxazole-trimethoprim]; and Sulfa antibiotics  Social History Social History   Tobacco Use  . Smoking status: Never Smoker  Substance Use Topics  . Alcohol use: No    Alcohol/week: 0.0 oz  . Drug use: No   Review of Systems Constitutional: Negative for fever. Cardiovascular: Negative for chest pain. Respiratory: Negative for shortness of  breath. Gastrointestinal: Negative for abdominal pain, vomiting and diarrhea. Musculoskeletal: Positive for back pain Skin: Negative for rash. Neurological: Negative for headaches, focal weakness or numbness.  All systems negative/normal/unremarkable except as stated in the HPI  ____________________________________________   PHYSICAL EXAM:  VITAL SIGNS: ED Triage Vitals  Enc Vitals Group     BP 12/25/17 1103 (!) 147/75     Pulse Rate 12/25/17 1103 100     Resp 12/25/17 1103 20     Temp 12/25/17 1103 97.7 F (36.5 C)     Temp Source 12/25/17 1103 Oral     SpO2 12/25/17 1103 98 %     Weight 12/25/17 1104 244 lb (110.7 kg)     Height 12/25/17 1104 5' (1.524 m)     Head Circumference --      Peak Flow --      Pain Score 12/25/17 1104 7     Pain Loc --      Pain Edu? --      Excl. in GC? --    Constitutional: Alert and oriented. Well appearing and in no distress. Eyes: Conjunctivae are normal. Normal extraocular movements. ENT   Head: Normocephalic and atraumatic.   Nose: No congestion/rhinnorhea.   Mouth/Throat: Mucous membranes are moist.   Neck: No stridor. Cardiovascular: Normal rate, regular rhythm. No murmurs, rubs, or gallops. Respiratory: Normal respiratory effort without tachypnea nor retractions. Breath sounds are clear and equal bilaterally. No wheezes/rales/rhonchi. Gastrointestinal: Soft and nontender. Normal bowel sounds Musculoskeletal: Nontender with normal range of motion in  extremities. No lower extremity tenderness nor edema. Neurologic:  Normal speech and language. No gross focal neurologic deficits are appreciated.  Skin:  Skin is warm, dry and intact. No rash noted. Psychiatric: Mood and affect are normal. Speech and behavior are normal.  ____________________________________________  EKG: Interpreted by me.  Sinus rhythm first-degree AV block, right bundle branch block, normal axis, normal  QT  ____________________________________________  ED COURSE:  As part of my medical decision making, I reviewed the following data within the electronic MEDICAL RECORD NUMBER History obtained from family if available, nursing notes, old chart and ekg, as well as notes from prior ED visits. Patient presented for syncope, we will assess with labs and imaging as indicated at this time.   Procedures ____________________________________________   LABS (pertinent positives/negatives)  Labs Reviewed  CBC - Abnormal; Notable for the following components:      Result Value   WBC 11.2 (*)    All other components within normal limits  COMPREHENSIVE METABOLIC PANEL - Abnormal; Notable for the following components:   Glucose, Bld 190 (*)    BUN 28 (*)    Albumin 3.4 (*)    All other components within normal limits  TROPONIN I - Abnormal; Notable for the following components:   Troponin I 0.20 (*)    All other components within normal limits    RADIOLOGY Images were viewed by me  Chest x-ray, lumbar spine x-ray Are unremarkable ____________________________________________  DIFFERENTIAL DIAGNOSIS   Syncope, dehydration, electrolyte abnormality, arrhythmia, MI  FINAL ASSESSMENT AND PLAN  Syncope, elevated troponin   Plan: The patient had presented for syncope was subsequently found to have elevated troponin.  Patient's imaging were unremarkable.  Patient was given aspirin and may need heparin as well.  I will discuss with hospitalist for admission   Ulice Dash, MD   Note: This note was generated in part or whole with voice recognition software. Voice recognition is usually quite accurate but there are transcription errors that can and very often do occur. I apologize for any typographical errors that were not detected and corrected.     Emily Filbert, MD 12/25/17 [id number]

## 2017-12-26 ENCOUNTER — Inpatient Hospital Stay
Admit: 2017-12-26 | Discharge: 2017-12-26 | Disposition: A | Discharge status: Home / Self Care | Payer: Medicaid Other | Attending: Internal Medicine | Admitting: Internal Medicine

## 2017-12-26 ENCOUNTER — Other Ambulatory Visit: Payer: Self-pay

## 2017-12-26 DIAGNOSIS — F250 Schizoaffective disorder, bipolar type: Secondary | ICD-10-CM

## 2017-12-26 DIAGNOSIS — F259 Schizoaffective disorder, unspecified: Secondary | ICD-10-CM

## 2017-12-26 LAB — GLUCOSE, CAPILLARY
GLUCOSE-CAPILLARY: 104 mg/dL — AB (ref 65–99)
Glucose-Capillary: 178 mg/dL — ABNORMAL HIGH (ref 65–99)
Glucose-Capillary: 137 mg/dL — ABNORMAL HIGH (ref 65–99)
Glucose-Capillary: 192 mg/dL — ABNORMAL HIGH (ref 65–99)

## 2017-12-26 LAB — CBC
HEMATOCRIT: 37.2 % (ref 35.0–47.0)
HEMOGLOBIN: 12.8 g/dL (ref 12.0–16.0)
MCH: 32.3 pg (ref 26.0–34.0)
MCHC: 34.5 g/dL (ref 32.0–36.0)
MCV: 93.6 fL (ref 80.0–100.0)
Platelets: 336 10*3/uL (ref 150–440)
RBC: 3.97 MIL/uL (ref 3.80–5.20)
RDW: 13.7 % (ref 11.5–14.5)
WBC: 10.1 10*3/uL (ref 3.6–11.0)

## 2017-12-26 LAB — ECHOCARDIOGRAM COMPLETE
Height: 60 in
Weight: 3936 oz

## 2017-12-26 LAB — BASIC METABOLIC PANEL
ANION GAP: 7 (ref 5–15)
BUN: 19 mg/dL (ref 6–20)
CALCIUM: 8.8 mg/dL — AB (ref 8.9–10.3)
CHLORIDE: 104 mmol/L (ref 101–111)
CO2: 27 mmol/L (ref 22–32)
Creatinine, Ser: 0.52 mg/dL (ref 0.44–1.00)
GFR calc non Af Amer: >60 mL/min (ref >60)
GLUCOSE: 134 mg/dL — AB (ref 65–99)
POTASSIUM: 3.7 mmol/L (ref 3.5–5.1)
Sodium: 138 mmol/L (ref 135–145)

## 2017-12-26 LAB — TROPONIN I: Troponin I: 0.24 ng/mL (ref <0.03)

## 2017-12-26 MED ORDER — MORPHINE SULFATE (PF) 2 MG/ML IV SOLN
2.0000 mg | Freq: Once | INTRAVENOUS | Status: AC
  Administered 2017-12-27: 2 mg via INTRAVENOUS
  Filled 2017-12-26: qty 1

## 2017-12-26 MED ORDER — OLANZAPINE 5 MG PO TABS
5.0000 mg | ORAL_TABLET | Freq: Two times a day (BID) | ORAL | Status: DC
  Administered 2017-12-26 – 2017-12-27 (×2): 5 mg via ORAL
  Filled 2017-12-26 (×3): qty 1

## 2017-12-26 MED ORDER — OCUVITE-LUTEIN PO CAPS
1.0000 | ORAL_CAPSULE | Freq: Every day | ORAL | Status: DC
  Administered 2017-12-26 – 2017-12-27 (×2): 1 via ORAL
  Filled 2017-12-26 (×2): qty 1

## 2017-12-26 NOTE — Progress Notes (Addendum)
[facility] - [place] at [facility]   PATIENT NAME: Destiny Cole    MR#:  [id number]  DATE OF BIRTH:  January 10, 1951  SUBJECTIVE:  Patient seen and evaluated today No new episodes of syncope No chest pain No shortness of breath   REVIEW OF SYSTEMS:    ROS  CONSTITUTIONAL: No documented fever. No fatigue, weakness. No weight gain, no weight loss.  EYES: No blurry or double vision.  ENT: No tinnitus. No postnasal drip. No redness of the oropharynx.  RESPIRATORY: No cough, no wheeze, no hemoptysis. No dyspnea.  CARDIOVASCULAR: No chest pain. No orthopnea. No palpitations. No syncope.  GASTROINTESTINAL: No nausea, no vomiting or diarrhea. No abdominal pain. No melena or hematochezia.  GENITOURINARY: No dysuria or hematuria.  ENDOCRINE: No polyuria or nocturia. No heat or cold intolerance.  HEMATOLOGY: No anemia. No bruising. No bleeding.  INTEGUMENTARY: No rashes. No lesions.  MUSCULOSKELETAL: No arthritis. No swelling. No gout.  NEUROLOGIC: No numbness, tingling, or ataxia. No seizure-type activity.  PSYCHIATRIC: No anxiety. No insomnia. No ADD.   DRUG ALLERGIES:   Allergies  Allergen Reactions  . Promethazine     She has a possible respiratory reaction with this medication with RR 4 and hypoxia a few minutes after administration. Other respiratory depressants like benzos should be used with caution.  . Bactrim [Sulfamethoxazole-Trimethoprim] Itching    And rash  . Sulfa Antibiotics Swelling and Rash    VITALS:  Blood pressure 138/73, pulse 99, temperature 97.8 F (36.6 C), temperature source Oral, resp. rate 14, height 5' (1.524 m), weight 111.6 kg (246 lb), SpO2 95 %.  PHYSICAL EXAMINATION:   Physical Exam  GENERAL:  67 y.o.-year-old patient lying in the bed with no acute distress.  EYES: Pupils equal, round, reactive to light and accommodation. No scleral icterus. Extraocular muscles intact.  HEENT: Head atraumatic, normocephalic. Oropharynx and  nasopharynx clear.  NECK:  Supple, no jugular venous distention. No thyroid enlargement, no tenderness.  LUNGS: Normal breath sounds bilaterally, no wheezing, rales, rhonchi. No use of accessory muscles of respiration.  CARDIOVASCULAR: S1, S2 normal. No murmurs, rubs, or gallops.  ABDOMEN: Soft, nontender, nondistended. Bowel sounds present. No organomegaly or mass.  EXTREMITIES: No cyanosis, clubbing or edema b/l.    NEUROLOGIC: Cranial nerves II through XII are intact. No focal Motor or sensory deficits b/l.   PSYCHIATRIC: The patient is alert and oriented x 3.  Has hallucinations Has some delusions according to family member SKIN: Has some skin lesions over the right lower extremity left side of the abdominal wall and forearm  LABORATORY PANEL:   CBC Recent Labs  Lab 12/26/17 0552  WBC 10.1  HGB 12.8  HCT 37.2  PLT 336   ------------------------------------------------------------------------------------------------------------------ Chemistries  Recent Labs  Lab 12/25/17 1115 12/26/17 0552  NA 136 138  K 4.2 3.7  CL 102 104  CO2 25 27  GLUCOSE 190* 134*  BUN 28* 19  CREATININE 0.70 0.52  CALCIUM 9.0 8.8*  AST 29  --   ALT 26  --   ALKPHOS 90  --   BILITOT 0.5  --    ------------------------------------------------------------------------------------------------------------------  Cardiac Enzymes Recent Labs  Lab 12/26/17 0013  TROPONINI 0.24*   ------------------------------------------------------------------------------------------------------------------  RADIOLOGY:  Dg Chest 2 View  Result Date: 12/25/2017 CLINICAL DATA:  Fall. EXAM: CHEST - 2 VIEW COMPARISON:  Radiograph of October 21, 2017. FINDINGS: Stable cardiomegaly. No pneumothorax or pleural effusion is noted. Both lungs are clear. The visualized skeletal structures are unremarkable. IMPRESSION:  No active cardiopulmonary disease. Electronically Signed   By: Lupita Raider, M.D.   On: 12/25/2017  14:56   Dg Lumbar Spine 2-3 Views  Result Date: 12/25/2017 CLINICAL DATA:  Fall with low back pain. EXAM: LUMBAR SPINE - 2-3 VIEW COMPARISON:  07/08/2015 FINDINGS: No evidence of acute fracture or traumatic malalignment. Lower lumbar facet spurring with slight L4-5 anterolisthesis. Osteopenia and arterial calcification. IMPRESSION: No acute finding. Electronically Signed   By: Marnee Spring M.D.   On: 12/25/2017 14:54   Ct Head Wo Contrast  Result Date: 12/25/2017 CLINICAL DATA:  Altered level of consciousness. EXAM: CT HEAD WITHOUT CONTRAST TECHNIQUE: Contiguous axial images were obtained from the base of the skull through the vertex without intravenous contrast. COMPARISON:  04/05/2015. FINDINGS: Brain: Large area of encephalomalacia in the left centrum semiovale is again identified and does not appear significantly changed in the interval. There is mild diffuse low-attenuation within the subcortical and periventricular white matter compatible with chronic microvascular disease. No evidence of acute infarction, hemorrhage, hydrocephalus, extra-axial collection or mass lesion/mass effect. Vascular: No hyperdense vessel or unexpected calcification. Skull: Normal. Negative for fracture or focal lesion. Sinuses/Orbits: No acute finding. Other: None. IMPRESSION: 1. No acute intracranial abnormalities. 2. 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 3. Chronic small vessel ischemic change. Electronically Signed   By: Signa Kell M.D.   On: 12/25/2017 16:34     ASSESSMENT AND PLAN:  67 year old female patient with history of cerebral palsy, diabetes mellitus type 2, congestive heart failure, hypertension currently under hospitalist service for questionable syncope  -Fall episode without syncope Status post cardiology evaluation No acute intervention work-up recommended Follow-up echocardiogram report Telemetry monitoring uneventful  -Elevated troponin Status post  cardiology evaluation No acute intervention recommended  -Cerebral palsy with behavioral disturbances As hallucinations Psychiatry follow-up for any adjustment of medication  -Type 2 diabetes mellitus Continue Lantus insulin and sliding scale coverage with insulin along with oral diabetic medications  -Chronic back pain No evidence of fracture on imaging studies Pain management to continue  -GERD Continue oral Protonix for GI prophylaxis  -Multiple skin lesions On Unasyn antibiotic intravenously  All the records are reviewed and case discussed with Care Management/Social Worker. Management plans discussed with the patient, family and they are in agreement.  CODE STATUS: Partial code  DVT Prophylaxis: SCDs  TOTAL TIME TAKING CARE OF THIS PATIENT: 35 minutes.   POSSIBLE D/C IN 1 to 2 DAYS, DEPENDING ON CLINICAL CONDITION.  Ihor Austin M.D on 12/26/2017 at 3:39 PM  Between 7am to 6pm - Pager - [phone]  After 6pm go to [email or web address] - password [email or web address]  [facility]  Office  [phone]  CC: Primary care physician; Karie Schwalbe, MD  Note: This dictation was prepared with Dragon dictation along with smaller phrase technology. Any transcriptional errors that result from this process are unintentional.

## 2017-12-26 NOTE — Progress Notes (Signed)
*  PRELIMINARY RESULTS* Echocardiogram 2D Echocardiogram has been performed.  Cristela Blue 12/26/2017, 2:47 PM

## 2017-12-26 NOTE — NC FL2 (Signed)
[place] MEDICAID FL2 LEVEL OF CARE SCREENING TOOL     IDENTIFICATION  Patient Name: Destiny Cole Birthdate: April 14, 1951 Sex: female Admission Date (Current Location): 12/25/2017  [place] and [place] Number:  [address] [id number] [facility] Facility and Address:  [facility], [address], [place], [place] [address]      Provider Number: [id number]  Attending Physician Name and Address:  Ihor Austin, MD  Relative Name and Phone Number:  Eden Emms Daughter   [phone] or Dillard,Alfred Other   [phone] or Cloud,Rhonda Daughter   [phone]     Current Level of Care: Hospital Recommended Level of Care: Skilled Nursing Facility Prior Approval Number:    Date Approved/Denied:   PASRR Number: [id number]  Discharge Plan: SNF    Current Diagnoses: Patient Active Problem List   Diagnosis Date Noted  . Schizoaffective disorder (HCC) 12/26/2017  . Syncopal episodes 12/25/2017  . Syncope 12/25/2017  . Delirium 10/23/2017  . GERD (gastroesophageal reflux disease) 10/25/2016  . 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 Facial droop 04/06/2015  . Anxiety state 04/06/2015  . DKA, type 2 (HCC) 04/05/2015  . Hyperlipidemia 04/05/2015  . Physical deconditioning   . Diabetes mellitus without complication (HCC)   . Cerebral palsy (HCC)   . Hypertension     Orientation RESPIRATION BLADDER Height & Weight     Self  Normal Incontinent Weight: 246 lb (111.6 kg)(RN notified of weight difference pt unable to stand) Height:  5' (152.4 cm)  BEHAVIORAL SYMPTOMS/MOOD NEUROLOGICAL BOWEL NUTRITION STATUS      Continent Diet(Carb Modified)  AMBULATORY STATUS COMMUNICATION OF NEEDS Skin   Extensive Assist Verbally Normal                       Personal Care Assistance Level of Assistance  Bathing, Feeding, Dressing Bathing Assistance: Maximum assistance Feeding assistance: Maximum assistance Dressing Assistance: Maximum assistance    Functional Limitations Info  Hearing, Speech, Sight Sight Info: Adequate Hearing Info: Adequate Speech Info: Adequate    SPECIAL CARE FACTORS FREQUENCY  PT (By licensed PT)     PT Frequency: minimum 2x a week              Contractures Contractures Info: Not present    Additional Factors Info  Code Status, Allergies, Psychotropic, Insulin Sliding Scale Code Status Info: Partial Code Allergies Info: PROMETHAZINE, BACTRIM SULFAMETHOXAZOLE-TRIMETHOPRIM, SULFA ANTIBIOTICS Psychotropic Info: busPIRone (BUSPAR) tablet 7.5 mg and OLANZapine (ZYPREXA) tablet 5 mg and venlafaxine XR (EFFEXOR-XR) 24 hr capsule 150 mg  Insulin Sliding Scale Info: insulin aspart (novoLOG) injection 0-9 Units 3x a day with meals       Current Medications (12/26/2017):  This is the current hospital active medication list Current Facility-Administered Medications  Medication Dose Route Frequency Provider Last Rate Last Dose  . acetaminophen (TYLENOL) tablet 650 mg  650 mg Oral Q4H PRN Altamese Dilling, MD      . Ampicillin-Sulbactam (UNASYN) 3 g in sodium chloride 0.9 % 100 mL IVPB  3 g Intravenous Q6H Coffee, Garrett, RPH 200 mL/hr at 12/26/17 1726 3 g at 12/26/17 1726  . aspirin chewable tablet 81 mg  81 mg Oral Daily Altamese Dilling, MD   81 mg at 12/26/17 0851  . busPIRone (BUSPAR) tablet 7.5 mg  7.5 mg Oral BID Altamese Dilling, MD   7.5 mg at 12/26/17 0852  . [START ON 12/29/2017] cholecalciferol (VITAMIN D) tablet 5,000 Units  5,000 Units Oral Q30 days Altamese Dilling, MD      .  docusate sodium (COLACE) capsule 100 mg  100 mg Oral BID PRN Altamese Dilling, MD      . gabapentin (NEURONTIN) capsule 100 mg  100 mg Oral QHS Altamese Dilling, MD   100 mg at 12/25/17 2239  . heparin injection 5,000 Units  5,000 Units Subcutaneous Q8H Altamese Dilling, MD   5,000 Units at 12/26/17 1610  . hydrocortisone 2.5 % cream 1 application  1 application Topical TID PRN  Altamese Dilling, MD      . hydrOXYzine (ATARAX/VISTARIL) tablet 25 mg  25 mg Oral Q6H PRN Altamese Dilling, MD      . insulin aspart (novoLOG) injection 0-9 Units  0-9 Units Subcutaneous TID WC Altamese Dilling, MD   2 Units at 12/26/17 1156  . insulin glargine (LANTUS) injection 35 Units  35 Units Subcutaneous BID Altamese Dilling, MD   35 Units at 12/26/17 [phone]  . lamoTRIgine (LAMICTAL) tablet 100 mg  100 mg Oral Q12H Altamese Dilling, MD   100 mg at 12/26/17 0851  . metFORMIN (GLUCOPHAGE) tablet 1,000 mg  1,000 mg Oral BID WC Altamese Dilling, MD   1,000 mg at 12/26/17 1725  . miconazole (MICOTIN) 2 % powder   Topical BID PRN Altamese Dilling, MD      . multivitamin-lutein (OCUVITE-LUTEIN) capsule 1 capsule  1 capsule Oral Daily Pyreddy, Vivien Rota, MD   1 capsule at 12/26/17 1726  . OLANZapine (ZYPREXA) tablet 5 mg  5 mg Oral BID Clapacs, John T, MD      . oxyCODONE-acetaminophen (PERCOCET/ROXICET) 5-325 MG per tablet 1 tablet  1 tablet Oral Q6H PRN Altamese Dilling, MD   1 tablet at 12/25/17 2244  . pantoprazole (PROTONIX) EC tablet 40 mg  40 mg Oral Daily Altamese Dilling, MD   40 mg at 12/26/17 0850  . pneumococcal 23 valent vaccine (PNU-IMMUNE) injection 0.5 mL  0.5 mL Intramuscular Tomorrow-1000 Altamese Dilling, MD      . venlafaxine XR (EFFEXOR-XR) 24 hr capsule 150 mg  150 mg Oral Q breakfast Altamese Dilling, MD   150 mg at 12/26/17 5409     Discharge Medications: Please see discharge summary for a list of discharge medications.  Relevant Imaging Results:  Relevant Lab Results:   Additional Information SSN: [id number]  [place]

## 2017-12-26 NOTE — Consult Note (Addendum)
WOC Nurse wound consult note Reason for Consult: Consult requested for abd, vaginal area, and bilat legs.  Pt states she was wearing Una boots prior to admission and they are changed once a week. Wound type: Left leg with partial thickness abrasion; .2X.2X.1cm, pink and moist. Right leg with previous blisters which have ruptured and peeled; evolving into partial thickness wounds.2X1X.1cm, 5X5X.1cm, 1X.1X.1cm; all are red and moist with small amt yellow drainage, no odor. Pt has significant abd folds; it is difficult to reduce pressure in between folds and they are red and moist with partial thickness fissures; appearance is consistent with intertrigo.   Right groin near upper vagina with full thickness wound; .2X.2X.1cm, yellow moist wound bed, small amt tan drainage, no odor or fluctuance. Dressing procedure/placement/frequency: Applied Una boots and coban to bilat legs.  WOC will change next Tuesday if the patient is still in the hospital at that time.  Her previous facility can resume changing them after discharge.  Antifungal powder to abd folds for intertrigo. Foam dressing to groin wound.  Discussed plan of care with patient. Please re-consult if further assistance is needed.  Thank-you,  Cammie Mcgee MSN, RN, CWOCN, [place], CNS [phone]

## 2017-12-26 NOTE — Consult Note (Signed)
[facility] Consultation Note  Patient ID: Destiny Cole, MRN: [id number], DOB/AGE: [birth year] 67 y.o. Admit date: 12/25/2017   Date of Consult: 12/26/2017 Primary Physician: Venia Carbon, MD Primary Cardiologist: None  Chief Complaint:  Chief Complaint  Patient presents with  . Loss of Consciousness   Reason for Consult: Syncope  HPI: 67 y.o. female with known apparent diastolic dysfunction congestive heart failure borderline diabetes cerebral palsy and essential hypertension with appropriate medication management.  The patient has been on insulin injection and metformin which has been working fairly well and no evidence of significant hypertension requiring additional medication management.  The patient is ambulatory and has been using a walker.  When moving out of the bathroom she felt hot and tired and weak and fell due to some of this weakness.  After falling the patient did have further pass out spell and concerns for further dizziness.  She did not hurt her self during this pass out spell and did not have any chest pain shortness of breath or palpitations prior to or after.  After the second time passing out she was taken to the emergency room for which time she had a EKG showing normal sinus rhythm with left axis deviation and right bundle branch block.  Troponin levels of 0.25 and somewhat unchanged not consistent with acute coronary syndrome.  In addition to that the patient now has been sleeping overnight with a telemetry showing normal sinus rhythm and no current significant symptoms.  She is hemodynamically stable  Past Medical History:  Diagnosis Date  . Cerebral palsy ([place])   . CHF (congestive heart failure) ([place])   . Diabetes mellitus without complication ([place])   . Hypertension       Surgical History:  Past Surgical History:  Procedure Laterality Date  . ABDOMINAL HYSTERECTOMY    . cerbral palsy    . MOUTH SURGERY       Home Meds: Prior to  Admission medications   Medication Sig Start Date End Date Taking? Authorizing Provider  acetaminophen (TYLENOL) 325 MG tablet Take 650 mg by mouth every 4 (four) hours as needed for mild pain or fever.    Yes [provider]  aspirin 81 MG chewable tablet Chew 81 mg by mouth daily.   Yes [provider]  busPIRone (BUSPAR) 7.5 MG tablet Take 1 tablet (7.5 mg total) by mouth 2 (two) times daily. Patient taking differently: Take 15 mg by mouth 2 (two) times daily.  04/08/15  Yes Reyne Dumas, MD  Cholecalciferol (VITAMIN D3) 50000 units CAPS Take 1 tablet by mouth every 30 (thirty) days. On the 24th.   Yes [provider]  Cranberry 425 MG CAPS Take 1 tablet by mouth daily.   Yes [provider]  Cyanocobalamin (B-12 COMPLIANCE INJECTION) 1000 MCG/ML KIT Inject 1,000 mcg as directed every 30 (thirty) days.   Yes [provider]  diclofenac sodium (VOLTAREN) 1 % GEL Apply 2 g topically 2 (two) times daily. Apply to knees and right hand.   Yes [provider]  fluconazole (DIFLUCAN) 100 MG tablet Take 100 mg by mouth See admin instructions. Take 100 mg by mouth for 7 days. This may be used monthly for 1 week as needed for yeast reoccurrence.   Yes [provider]  gabapentin (NEURONTIN) 100 MG capsule Take 100 mg by mouth at bedtime.   Yes [provider]  hydrocortisone 2.5 % cream Apply 1 application topically 3 (three) times daily as needed (  rash).   Yes [provider]  hydrOXYzine (ATARAX/VISTARIL) 25 MG tablet Take 25 mg by mouth every 6 (six) hours as needed for itching.    Yes [provider]  insulin glargine (LANTUS) 100 UNIT/ML injection Inject 45 Units into the skin 2 (two) times daily.   Yes [provider]  lamoTRIgine (LAMICTAL) 100 MG tablet Take 100 mg by mouth every 12 (twelve) hours.    Yes [provider]  metFORMIN (GLUCOPHAGE) 1000 MG tablet Take 1,000 mg by mouth 2 (two)  times daily with a meal.   Yes [provider]  miconazole (MICOTIN) 2 % powder Apply topically daily as needed for itching. Apply under abdomen in folds and red areas daily as needed.   Yes [provider]  naproxen sodium (ALEVE) 220 MG tablet Take 220 mg by mouth 2 (two) times daily as needed (pain).    Yes [provider]  potassium chloride (K-DUR) 10 MEQ tablet Take 1 tablet (10 mEq total) by mouth daily. Patient taking differently: Take 20 mEq by mouth daily.  10/30/12  Yes Jessee Avers, MD  venlafaxine XR (EFFEXOR-XR) 150 MG 24 hr capsule Take 150 mg by mouth daily with breakfast.   Yes [provider]  FLUoxetine (PROZAC) 20 MG capsule Take 1 capsule (20 mg total) by mouth daily. Patient not taking: Reported on 10/21/2017 04/08/15   Reyne Dumas, MD  insulin aspart (NOVOLOG) cartridge Correction coverage: Sensitive (thin, NPO, renal)     CBG < 70: implement hypoglycemia protocol    CBG 70 - 120: 0 units    CBG 121 - 150: 1 unit    CBG 151 - 200: 2 units    CBG 201 - 250: 3 units    CBG 251 - 300: 5 units    CBG 301 - 350: 7 units    CBG 351 - 400 9 units    CBG > 400 call MD and obtain STAT lab verification Patient not taking: Reported on 10/21/2017 04/08/15   Reyne Dumas, MD  loperamide (IMODIUM) 2 MG capsule Take 1 capsule (2 mg total) by mouth as needed for diarrhea or loose stools. Patient not taking: Reported on 10/21/2017 04/24/15   Hillary Bow, MD  ondansetron (ZOFRAN) 4 MG tablet Take 1 tablet (4 mg total) by mouth every 8 (eight) hours as needed for nausea. Patient not taking: Reported on 10/21/2017 10/30/12   Jessee Avers, MD  oxyCODONE-acetaminophen (PERCOCET/ROXICET) 5-325 MG per tablet Take 1 tablet by mouth every 6 (six) hours as needed for moderate pain. Patient not taking: Reported on 10/21/2017 04/23/15   Hillary Bow, MD  pantoprazole (PROTONIX) 40 MG tablet Take 1 tablet (40 mg total) by mouth  daily. Patient not taking: Reported on 10/21/2017 10/30/12   Jessee Avers, MD    Inpatient Medications:  . aspirin  81 mg Oral Daily  . busPIRone  7.5 mg Oral BID  . [START ON 12/29/2017] cholecalciferol  5,000 Units Oral Q30 days  . gabapentin  100 mg Oral QHS  . heparin  5,000 Units Subcutaneous Q8H  . insulin aspart  0-9 Units Subcutaneous TID WC  . insulin glargine  35 Units Subcutaneous BID  . lamoTRIgine  100 mg Oral Q12H  . metFORMIN  1,000 mg Oral BID WC  . pantoprazole  40 mg Oral Daily  . pneumococcal 23 valent vaccine  0.5 mL Intramuscular Tomorrow-1000  . venlafaxine XR  150 mg Oral Q breakfast   . ampicillin-sulbactam (UNASYN) IV Stopped (12/26/17  0712)    Allergies:  Allergies  Allergen Reactions  . Promethazine     She has a possible respiratory reaction with this medication with RR 4 and hypoxia a few minutes after administration. Other respiratory depressants like benzos should be used with caution.  . Bactrim [Sulfamethoxazole-Trimethoprim] Itching    And rash  . Sulfa Antibiotics Swelling and Rash    Social History   Socioeconomic History  . Marital status: Married    Spouse name: Not on file  . Number of children: Not on file  . Years of education: Not on file  . Highest education level: Not on file  Occupational History  . Not on file  Social Needs  . Financial resource strain: Not on file  . Food insecurity:    Worry: Not on file    Inability: Not on file  . Transportation needs:    Medical: Not on file    Non-medical: Not on file  Tobacco Use  . Smoking status: Never Smoker  . Smokeless tobacco: Never Used  Substance and Sexual Activity  . Alcohol use: No    Alcohol/week: 0.0 oz  . Drug use: No  . Sexual activity: Never  Lifestyle  . Physical activity:    Days per week: Not on file    Minutes per session: Not on file  . Stress: Not on file  Relationships  . Social connections:    Talks on phone: Not on file    Gets together: Not  on file    Attends religious service: Not on file    Active member of club or organization: Not on file    Attends meetings of clubs or organizations: Not on file    Relationship status: Not on file  . Intimate partner violence:    Fear of current or ex partner: Not on file    Emotionally abused: Not on file    Physically abused: Not on file    Forced sexual activity: Not on file  Other Topics Concern  . Not on file  Social History Narrative  . Not on file     Family History  Problem Relation Age of Onset  . Breast cancer Mother   . Hypertension Mother   . Diabetes Mellitus II Maternal Aunt      Review of Systems Positive for syncope fall Negative for: General:  chills, fever, night sweats or weight changes.  Cardiovascular: PND orthopnea positive for syncope dizziness  Dermatological skin lesions rashes Respiratory: Cough congestion Urologic: Frequent urination urination at night and hematuria Abdominal: negative for nausea, vomiting, diarrhea, bright red blood per rectum, melena, or hematemesis Neurologic: negative for visual changes, and/or hearing changes  All other systems reviewed and are otherwise negative except as noted above.  Labs: Recent Labs    12/25/17 1115 12/25/17 1935 12/25/17 2203 12/26/17 0013  TROPONINI 0.20* 0.19* 0.25* 0.24*   Lab Results  Component Value Date   WBC 10.1 12/26/2017   HGB 12.8 12/26/2017   HCT 37.2 12/26/2017   MCV 93.6 12/26/2017   PLT 336 12/26/2017    Recent Labs  Lab 12/25/17 1115 12/26/17 0552  NA 136 138  K 4.2 3.7  CL 102 104  CO2 25 27  BUN 28* 19  CREATININE 0.70 0.52  CALCIUM 9.0 8.8*  PROT 7.2  --   BILITOT 0.5  --   ALKPHOS 90  --   ALT 26  --   AST 29  --   GLUCOSE 190* 134*  Lab Results  Component Value Date   CHOL 65 04/16/2015   HDL 14 (L) 04/16/2015   LDLCALC 36 04/16/2015   TRIG 76 04/16/2015   Lab Results  Component Value Date   DDIMER (H) 03/31/2010    1.24        AT THE INHOUSE  ESTABLISHED CUTOFF VALUE OF 0.48 ug/mL FEU, THIS ASSAY HAS BEEN DOCUMENTED IN THE LITERATURE TO HAVE A SENSITIVITY AND NEGATIVE PREDICTIVE VALUE OF AT LEAST 98 TO 99%.  THE TEST RESULT SHOULD BE CORRELATED WITH AN ASSESSMENT OF THE CLINICAL PROBABILITY OF DVT / VTE.    Radiology/Studies:  Dg Chest 2 View  Result Date: 12/25/2017 CLINICAL DATA:  Fall. EXAM: CHEST - 2 VIEW COMPARISON:  Radiograph of October 21, 2017. FINDINGS: Stable cardiomegaly. No pneumothorax or pleural effusion is noted. Both lungs are clear. The visualized skeletal structures are unremarkable. IMPRESSION: No active cardiopulmonary disease. Electronically Signed   By: Marijo Conception, M.D.   On: 12/25/2017 14:56   Dg Lumbar Spine 2-3 Views  Result Date: 12/25/2017 CLINICAL DATA:  Fall with low back pain. EXAM: LUMBAR SPINE - 2-3 VIEW COMPARISON:  07/08/2015 FINDINGS: No evidence of acute fracture or traumatic malalignment. Lower lumbar facet spurring with slight L4-5 anterolisthesis. Osteopenia and arterial calcification. IMPRESSION: No acute finding. Electronically Signed   By: Monte Fantasia M.D.   On: 12/25/2017 14:54   Ct Head Wo Contrast  Result Date: 12/25/2017 CLINICAL DATA:  Altered level of consciousness. EXAM: CT HEAD WITHOUT CONTRAST TECHNIQUE: Contiguous axial images were obtained from the base of the skull through the vertex without intravenous contrast. COMPARISON:  04/05/2015. FINDINGS: Brain: Large area of encephalomalacia in the left centrum semiovale is again identified and does not appear significantly changed in the interval. There is mild diffuse low-attenuation within the subcortical and periventricular white matter compatible with chronic microvascular disease. No evidence of acute infarction, hemorrhage, hydrocephalus, extra-axial collection or mass lesion/mass effect. Vascular: No hyperdense vessel or unexpected calcification. Skull: Normal. Negative for fracture or focal lesion. Sinuses/Orbits: No  acute finding. Other: None. IMPRESSION: 1. No acute intracranial abnormalities. 2. 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 3. Chronic small vessel ischemic change. Electronically Signed   By: Kerby Moors M.D.   On: 12/25/2017 16:34    EKG: Sinus rhythm with left axis to ranch block  Weights: Filed Weights   12/25/17 1104 12/25/17 1842 12/26/17 0347  Weight: 244 lb (110.7 kg) 234 lb (106.1 kg) 246 lb (111.6 kg)     Physical Exam: Blood pressure 138/73, pulse 99, temperature 97.8 F (36.6 C), temperature source Oral, resp. rate 14, height 5' (1.524 m), weight 246 lb (111.6 kg), SpO2 95 %. Body mass index is 48.04 kg/m. General: Well developed, well nourished, in no acute distress. Head eyes ears nose throat: Normocephalic, atraumatic, sclera non-icteric, no xanthomas, nares are without discharge. No apparent thyromegaly and/or mass  Lungs: Normal respiratory effort.  no wheezes, no rales, no rhonchi.  Heart: RRR with normal S1 S2. no murmur gallop, no rub, PMI is normal size and placement, carotid upstroke normal without bruit, jugular venous pressure is normal Abdomen: Soft, non-tender, non-distended with normoactive bowel sounds. No hepatomegaly. No rebound/guarding. No obvious abdominal masses. Abdominal aorta is normal size without bruit Extremities: Trace edema. no cyanosis, no clubbing, no ulcers  Peripheral : 2+ bilateral upper extremity pulses, 2+ bilateral femoral pulses, 2+ bilateral dorsal pedal pulse Neuro: Alert and oriented. No facial asymmetry. No focal deficit. Moves all extremities  spontaneously. Musculoskeletal: Normal muscle tone without kyphosis Psych:  Responds to questions appropriately with a normal affect.    Assessment: 67 year old female with diabetes essential hypertension mixed hyperlipidemia with fall episode with and without syncope and no current evidence of angina congestive heart failure or acute coronary syndrome  Plan: 1.   Begin ambulation and rehabilitation with cerebral palsy and watch for any telemetry abnormalities and or other significant issues 2.  Continue supportive care of diabetes 3.  No additional medication management for blood pressure which appears to be relatively stable at this time 4.  Okay for discharge to home if ambulating well with no further significant symptoms a with possible follow-up in 1 to 2 weeks for further adjustments of medication management and assessment for other rhythm disturbances  Signed, Corey Skains M.D. [facility] 12/26/2017, 8:08 AM

## 2017-12-26 NOTE — Clinical Social Work Note (Addendum)
CSW was informed that patient is from [facility] as a long term care resident.  CSW spoke to [facility] and confirmed that she is a resident.  CSW was also informed that patient's family would like psych to see her.  CSW spoke to [facility] asking about psych appointments, and [facility] said patient's family are not willing to go with her to appointments.  CSW was informed by [facility] that patient typically does have behaviors and they have tried to set up psychiatrist in the community however family are never willing to go with her to appointments.  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  Psychiatry has been consulted by physician, awaiting psychiatry input.  [facility] can accept patient back once she is medically ready for discharge and orders have been received.  Ervin Knack. Hassan Rowan, MSW, Theresia Majors [phone]  12/26/2017 5:32 PM

## 2017-12-26 NOTE — Progress Notes (Addendum)
Nutrition Brief Note  Patient identified on the Malnutrition Screening Tool (MST) Report  67 y/o female with cerebral palsy, DM, CHF, admitted with syncope  Met with pt in room today. Pt reports intermittent poor appetite pta. Pt reports that he appetite is good today but she hates the hospital food. Pt ate 100% of her breakfast today. Per chart, pt is weight stable. Pt declines all supplements and nutritional interventions at this time. RD will liberalize the heart healthy restriction from pt's diet as this restricts protein also. RD will add double protein with meals as pt with morbid obesity.   Wt Readings from Last 15 Encounters:  12/26/17 246 lb (111.6 kg)  10/21/17 244 lb (110.7 kg)  04/04/16 209 lb 3.2 oz (94.9 kg)  05/03/15 204 lb (92.5 kg)  04/24/15 219 lb 8 oz (99.6 kg)  04/05/15 201 lb 4.5 oz (91.3 kg)  04/01/15 206 lb (93.4 kg)  03/22/15 206 lb (93.4 kg)  10/29/12 246 lb 4.1 oz (111.7 kg)    Body mass index is 48.04 kg/m. Patient meets criteria for morbid obesity based on current BMI.   Current diet order is HH/CHO, patient is consuming approximately 100% of meals at this time. Labs and medications reviewed.   No nutrition interventions warranted at this time. If nutrition issues arise, please consult RD.   Koleen Distance MS, RD, LDN Pager #- [phone] Office#- [phone] After Hours Pager: [phone]

## 2017-12-26 NOTE — Evaluation (Signed)
Physical Therapy Evaluation Patient Details Name: Destiny Cole MRN: [id number] DOB: 1951/04/27 Today's Date: 12/26/2017   History of Present Illness  Patient is a 67 year old female admitted s/p syncopal episode and fall.  PMH includes cerebral palsy, DM, CHF and Htn.  Clinical Impression  Patient is a 67 year old female who lives in an ALF alone.  She receives assistance with ADL's and uses a RW for ambulation.  Pt in bed with R LL wrapped in a towel for prevention of pain and shearing.  Pt able to perform bed mobility mod I (she reports sleeping in a recliner at home).  She is able to sit at EOB without assistance for balance and presents with decreased strength of R UE/LE compared to L.  Pt is able to stand with RW with multiple attempts and use of L UE for support.  She is able to navigate room and restroom safely and ambulated 60 ft with supervision.  Pt requested assistance to stand from toilet but states that her toilet at home has a riser.  PT educated pt concerning safe use of RW and being mindful to not remove her hands from the walker when standing or walking.  Pt presents with fair standing balance statically and dynamically without use of RW.  Pt requested that LL be wrapped with towel at end of treatment to prevent shearing force created by recliner.  R LL wrapped and elevated upon PT departure.  Pt will benefit from continued skilled PT with focus on balance and fall prevention, HEP for strength, functional mobility and pain management.    Follow Up Recommendations Home health PT;Supervision for mobility/OOB    Equipment Recommendations  None recommended by PT    Recommendations for Other Services       Precautions / Restrictions Precautions Precautions: Fall Precaution Comments: HFR-recent fall Restrictions Weight Bearing Restrictions: No      Mobility  Bed Mobility Overal bed mobility: Needs Assistance Bed Mobility: Supine to Sit     Supine to sit: Min assist      General bed mobility comments: Assistance to achieve upright posture after pt initiated movement.  Able to sit at EOB with feet on floor and no assistance.  Transfers Overall transfer level: Needs assistance Equipment used: Rolling walker (2 wheeled) Transfers: Sit to/from Stand Sit to Stand: Min assist         General transfer comment: Assistance to initate STS from toilet.  Able to complete with supervision from bedside and recliner with 2 attempts.  Ambulation/Gait Ambulation/Gait assistance: Supervision Ambulation Distance (Feet): 60 Feet Assistive device: Rolling walker (2 wheeled)     Gait velocity interpretation: 1.31 - 2.62 ft/sec, indicative of limited community ambulator General Gait Details: ambulated to bathroom with low foot clearance and decreased hip and knee flexion during swing phase.  Able to navigate obstacles and lifts RW or removes hands at times.  PT discussed safety and importance of proper use of RW.  Stairs            Wheelchair Mobility    Modified Rankin (Stroke Patients Only)       Balance Overall balance assessment: Modified Independent                                           Pertinent Vitals/Pain Pain Assessment: Faces Faces Pain Scale: Hurts a little bit Pain Location: R  posterior LL.  Pt requests that leg be wrapped with towel to avoid friction Pain Intervention(s): Repositioned    Home Living Family/patient expects to be discharged to:: Assisted living               Home Equipment: Walker - 2 wheels;Shower seat - built in;Toilet riser      Prior Function Level of Independence: Needs assistance   Gait / Transfers Assistance Needed: Walks with RW, could walk "the block" two times in a row, has a recliner which she sleeps in that assists her in STS.  ADL's / Homemaking Assistance Needed: Assistance with bathing and dressing.        Hand Dominance   Dominant Hand: Left    Extremity/Trunk  Assessment   Upper Extremity Assessment Upper Extremity Assessment: Generalized weakness(L UE grossly4-/5, R UE grossly 3-/5)    Lower Extremity Assessment Lower Extremity Assessment: Overall WFL for tasks assessed(LLE ankle PF/DF: 4+/5, knee flexion/extension: 4/5, R LE: ankle PF/DF: 4-/5, knee flexion/extension: 3+/5.)    Cervical / Trunk Assessment Cervical / Trunk Assessment: Normal  Communication   Communication: No difficulties  Cognition Arousal/Alertness: Awake/alert Behavior During Therapy: WFL for tasks assessed/performed Overall Cognitive Status: Within Functional Limits for tasks assessed                                 General Comments: A&Ox4, follows commands consistently      General Comments      Exercises     Assessment/Plan    PT Assessment Patient needs continued PT services  PT Problem List Decreased strength;Decreased mobility;Decreased safety awareness;Decreased balance;Decreased knowledge of use of DME       PT Treatment Interventions DME instruction;Therapeutic activities;Gait training;Therapeutic exercise;Stair training;Balance training;Functional mobility training;Neuromuscular re-education;Patient/family education    PT Goals (Current goals can be found in the Care Plan section)  Acute Rehab PT Goals Patient Stated Goal: To return home and back to her general daily routine. PT Goal Formulation: With patient Time For Goal Achievement: 01/09/18 Potential to Achieve Goals: Good    Frequency Min 2X/week   Barriers to discharge        Co-evaluation               AM-PAC PT "6 Clicks" Daily Activity  Outcome Measure Difficulty turning over in bed (including adjusting bedclothes, sheets and blankets)?: A Little Difficulty moving from lying on back to sitting on the side of the bed? : A Little Difficulty sitting down on and standing up from a chair with arms (e.g., wheelchair, bedside commode, etc,.)?: A Little Help needed  moving to and from a bed to chair (including a wheelchair)?: A Little Help needed walking in hospital room?: A Little Help needed climbing 3-5 steps with a railing? : A Lot 6 Click Score: 17    End of Session Equipment Utilized During Treatment: Gait belt Activity Tolerance: Patient tolerated treatment well Patient left: in chair;with call bell/phone within reach;with chair alarm set Nurse Communication: Mobility status PT Visit Diagnosis: Unsteadiness on feet (R26.81);Muscle weakness (generalized) (M62.81);History of falling (Z91.81)    Time: 1308-6578 PT Time Calculation (min) (ACUTE ONLY): 25 min   Charges:   PT Evaluation $PT Eval Low Complexity: 1 Low     PT G Codes:   PT G-Codes **NOT FOR INPATIENT CLASS** Functional Assessment Tool Used: AM-PAC 6 Clicks Basic Mobility    Glenetta Hew, PT, DPT  Glenetta Hew 12/26/2017, 9:04 AM

## 2017-12-26 NOTE — Clinical Social Work Note (Signed)
Clinical Social Work Assessment  Patient Details  Name: Destiny Cole MRN: [id number] Date of Birth: [birth year]  Date of referral:  12/26/17               Reason for consult:  Facility Placement                Permission sought to share information with:  Facility Medical sales representative, Family Supports Permission granted to share information::  Yes, Verbal Permission Granted  Name::     Eden Emms Daughter   [phone] or Dillard,Alfred Other   [phone] or Cloud,Rhonda Daughter   [phone]   Agency::  SNF admissions  Relationship::     Contact Information:     Housing/Transportation Living arrangements for the past 2 months:  Skilled Building surveyor of Information:  Medical Team Patient Interpreter Needed:  None Criminal Activity/Legal Involvement Pertinent to Current Situation/Hospitalization:  No - Comment as needed Significant Relationships:  Adult Children Lives with:  Facility Resident Do you feel safe going back to the place where you live?  Yes Need for family participation in patient care:  Yes (Comment)  Care giving concerns:  Patient's family would like psych to see patient while she is in hospital.  Plan to return back to [facility].   Social Worker assessment / plan:  Patient is a 67 year old female who is a long term care resident at [facility].  Per [facility], patient has been at [facility] for a couple of years.  Patient has some dementia, and mental illness, patient has some hallucinations at time as well.  Patient's family would like her to return to SNF, and are trying to get her established with a psychiatrist in the community.  Patient's daughter is the main contact, and the plan is for patient to return back to SNF.  Patient's family did not express any other concerns about going back to SNF, they would just like psych to see her in hospital.  CSW was given permission to send clinicals to SNF and facilitate discharge planning.  Employment  status:  Disabled (Comment on whether or not currently receiving Disability) Insurance information:  Medicaid In [place] PT Recommendations:  Skilled Nursing Facility Information / Referral to community resources:  Skilled Nursing Facility  Patient/Family's Response to care:  Patient's family plan to have her return back to SNF.  Patient/Family's Understanding of and Emotional Response to Diagnosis, Current Treatment, and Prognosis: Patient's family are hopeful that an adjustment in psych meds will help decrease patient's psychotic episodes.  Emotional Assessment Appearance:  Appears stated age Attitude/Demeanor/Rapport:    Affect (typically observed):  Appropriate, Stable Orientation:  Oriented to Self Alcohol / Substance use:  Not Applicable Psych involvement (Current and /or in the community):  Yes (Comment)  Discharge Needs  Concerns to be addressed:  Cognitive Concerns, Mental Health Concerns, Care Coordination Readmission within the last 30 days:  No Current discharge risk:  Psychiatric Illness, Cognitively Impaired, Dependent with Mobility Barriers to Discharge:  Continued Medical Work up   [place] Constable 12/26/2017, 6:24 PM

## 2017-12-26 NOTE — Consult Note (Signed)
[place] Psychiatry Consult   Reason for Consult: Consult for 67 year old woman with multiple medical problems who came to the hospital for a fall.  Concern about mental state Referring Physician: Pyreddy Patient Identification: Destiny Cole MRN:  [id number] Principal Diagnosis: Schizoaffective disorder [facility]) Diagnosis:   Patient Active Problem List   Diagnosis Date Noted  . Schizoaffective disorder ([place]) [F25.9] 12/26/2017  . Syncopal episodes [R55] 12/25/2017  . Syncope [R55] 12/25/2017  . Delirium [R41.0] 10/23/2017  . GERD (gastroesophageal reflux disease) [K21.9] 10/25/2016  . 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 Facial droop [R29.810] 04/06/2015  . Anxiety state [F41.1] 04/06/2015  . DKA, type 2 ([place]) [E11.10] 04/05/2015  . Hyperlipidemia [E78.5] 04/05/2015  . Physical deconditioning [R53.81]   . Diabetes mellitus without complication ([place]) [Z56.3]   . Cerebral palsy ([place]) [G80.9]   . Hypertension [I10]     Total Time spent with patient: 1 hour  Subjective:   Destiny Cole is a 67 y.o. female patient admitted with "I fell down".  HPI: Patient seen chart reviewed.  I also spoke with her daughter Destiny Cole by telephone.  Reviewed the few old psychiatric notes that we do have access to.  67 year old woman who is a resident at [facility] came to the hospital after falling down in the bathroom.  It is noted that the patient has an odd mental state.  Tends to ramble on at length telling bizarre and very unlikely stories.  Daughter tells me that this is been getting worse.  The patient will accuse people of all sorts of bizarre behaviors.  At times will say that she is seeing things.  Has trouble sleeping at night.  Has not been violent to anyone or threatened to harm herself although at times she picks her skin because of her visual hallucinations.  Patient is receiving psychiatric medicine as far as I can tell from her chart it looks like it is a  combination of Effexor lamotrigine and BuSpar along with the multiple other things she is taking.  Daughter tells me the patient is no longer getting regular narcotics.  Social history: Patient was a housewife for a long time and according to the daughter he was a pretty dysfunctional environment.  Since her husband passed away she has required assistance and lived with family and now has been at [facility] for several months.  Medical history: Patient has cerebral palsy with some lack of strength on the right side mostly.  Unclear whether it affected her cognition or mental status.  Also has diabetes is overweight and is in the hospital after falling.  Substance abuse history: None  Past Psychiatric History: Patient was admitted to a psychiatric hospital back in the 1970s for reasons that remain unclear.  Unclear whether she actually did have a suicide attempt at that time.  The daughter tells me that the patient has always been strange and has always had "trouble telling what is real".  It sounds like during the time that she lived with her family this was tolerated because it never got in the way of her activity as a housewife.  No other psychiatric treatment that was known of although it looks like now she is receiving Effexor and Lamictal and BuSpar.  It is not clear to me what the thinking is behind those or how those came to pass.  Last time I saw her a few months ago she was on a little bit of Zyprexa which does not seem  to be prescribed anymore  Risk to Self: Is patient at risk for suicide?: No Risk to Others:   Prior Inpatient Therapy:   Prior Outpatient Therapy:    Past Medical History:  Past Medical History:  Diagnosis Date  . Cerebral palsy ([place])   . CHF (congestive heart failure) ([place])   . Diabetes mellitus without complication ([place])   . Hypertension     Past Surgical History:  Procedure Laterality Date  . ABDOMINAL HYSTERECTOMY    . cerbral palsy    . MOUTH SURGERY      Family History:  Family History  Problem Relation Age of Onset  . Breast cancer Mother   . Hypertension Mother   . Diabetes Mellitus II Maternal Aunt    Family Psychiatric  History: None known Social History:  Social History   Substance and Sexual Activity  Alcohol Use No  . Alcohol/week: 0.0 oz     Social History   Substance and Sexual Activity  Drug Use No    Social History   Socioeconomic History  . Marital status: Married    Spouse name: Not on file  . Number of children: Not on file  . Years of education: Not on file  . Highest education level: Not on file  Occupational History  . Not on file  Social Needs  . Financial resource strain: Not on file  . Food insecurity:    Worry: Not on file    Inability: Not on file  . Transportation needs:    Medical: Not on file    Non-medical: Not on file  Tobacco Use  . Smoking status: Never Smoker  . Smokeless tobacco: Never Used  Substance and Sexual Activity  . Alcohol use: No    Alcohol/week: 0.0 oz  . Drug use: No  . Sexual activity: Never  Lifestyle  . Physical activity:    Days per week: Not on file    Minutes per session: Not on file  . Stress: Not on file  Relationships  . Social connections:    Talks on phone: Not on file    Gets together: Not on file    Attends religious service: Not on file    Active member of club or organization: Not on file    Attends meetings of clubs or organizations: Not on file    Relationship status: Not on file  Other Topics Concern  . Not on file  Social History Narrative  . Not on file   Additional Social History:    Allergies:   Allergies  Allergen Reactions  . Promethazine     She has a possible respiratory reaction with this medication with RR 4 and hypoxia a few minutes after administration. Other respiratory depressants like benzos should be used with caution.  . Bactrim [Sulfamethoxazole-Trimethoprim] Itching    And rash  . Sulfa Antibiotics Swelling and  Rash    Labs:  Results for orders placed or performed during the hospital encounter of 12/25/17 (from the past 48 hour(s))  CBC     Status: Abnormal   Collection Time: 12/25/17 11:15 AM  Result Value Ref Range   WBC 11.2 (H) 3.6 - 11.0 K/uL   RBC 4.02 3.80 - 5.20 MIL/uL   Hemoglobin 12.8 12.0 - 16.0 g/dL   HCT 37.6 35.0 - 47.0 %   MCV 93.4 80.0 - 100.0 fL   MCH 31.7 26.0 - 34.0 pg   MCHC 34.0 32.0 - 36.0 g/dL   RDW 13.9 11.5 -  14.5 %   Platelets 381 150 - 440 K/uL    Comment: Performed at [facility], [address] [place], [place] [address]  Comprehensive metabolic panel     Status: Abnormal   Collection Time: 12/25/17 11:15 AM  Result Value Ref Range   Sodium 136 135 - 145 mmol/L   Potassium 4.2 3.5 - 5.1 mmol/L   Chloride 102 101 - 111 mmol/L   CO2 25 22 - 32 mmol/L   Glucose, Bld 190 (H) 65 - 99 mg/dL   BUN 28 (H) 6 - 20 mg/dL   Creatinine, Ser 0.70 0.44 - 1.00 mg/dL   Calcium 9.0 8.9 - 10.3 mg/dL   Total Protein 7.2 6.5 - 8.1 g/dL   Albumin 3.4 (L) 3.5 - 5.0 g/dL   AST 29 15 - 41 U/L   ALT 26 14 - 54 U/L   Alkaline Phosphatase 90 38 - 126 U/L   Total Bilirubin 0.5 0.3 - 1.2 mg/dL   GFR calc non Af Amer >60 >60 mL/min   GFR calc Af Amer >60 >60 mL/min    Comment: (NOTE) The eGFR has been calculated using the CKD EPI equation. This calculation has not been validated in all clinical situations. eGFR's persistently <60 mL/min signify possible Chronic Kidney Disease.    Anion gap 9 5 - 15    Comment: Performed at [facility], [address] [place], [place] [address]  Troponin I     Status: Abnormal   Collection Time: 12/25/17 11:15 AM  Result Value Ref Range   Troponin I 0.20 (HH) <0.03 ng/mL    Comment: CRITICAL RESULT CALLED TO, READ BACK BY AND VERIFIED WITH KIM GAULT AT 1204 12/25/17 DAS Performed at [facility], [address] [place], [place] [address]   Urinalysis, Complete w Microscopic     Status: Abnormal    Collection Time: 12/25/17  6:12 PM  Result Value Ref Range   Color, Urine YELLOW (A) YELLOW   APPearance HAZY (A) CLEAR   Specific Gravity, Urine 1.014 1.005 - 1.030   pH 7.0 5.0 - 8.0   Glucose, UA 150 (A) NEGATIVE mg/dL   Hgb urine dipstick SMALL (A) NEGATIVE   Bilirubin Urine NEGATIVE NEGATIVE   Ketones, ur NEGATIVE NEGATIVE mg/dL   Protein, ur 100 (A) NEGATIVE mg/dL   Nitrite NEGATIVE NEGATIVE   Leukocytes, UA NEGATIVE NEGATIVE   RBC / HPF 0-5 0 - 5 RBC/hpf   WBC, UA 0-5 0 - 5 WBC/hpf   Bacteria, UA RARE (A) NONE SEEN   Squamous Epithelial / LPF 0-5 0 - 5   Mucus PRESENT     Comment: Performed at [facility], [place], [place] [address]  Glucose, capillary     Status: Abnormal   Collection Time: 12/25/17  6:42 PM  Result Value Ref Range   Glucose-Capillary 174 (H) 65 - 99 mg/dL   Comment 1 Notify RN    Comment 2 Document in Chart   MRSA PCR Screening     Status: None   Collection Time: 12/25/17  6:45 PM  Result Value Ref Range   MRSA by PCR NEGATIVE NEGATIVE    Comment:        The GeneXpert MRSA Assay (FDA approved for NASAL specimens only), is one component of a comprehensive MRSA colonization surveillance program. It is not intended to diagnose MRSA infection nor to guide or monitor treatment for MRSA infections. Performed at [facility], [address] [place], [place] [address]   Troponin I     Status: Abnormal   Collection Time: 12/25/17  7:35 PM  Result Value Ref Range   Troponin I 0.19 (HH) <0.03 ng/mL    Comment: CRITICAL VALUE NOTED. VALUE IS CONSISTENT WITH PREVIOUSLY REPORTED/CALLED VALUE AKT Performed at [facility], [place], [place] [address]   Glucose, capillary     Status: Abnormal   Collection Time: 12/25/17  9:14 PM  Result Value Ref Range   Glucose-Capillary 118 (H) 65 - 99 mg/dL   Comment 1 Notify RN    Comment 2 Document in Chart   Troponin I     Status: Abnormal    Collection Time: 12/25/17 10:03 PM  Result Value Ref Range   Troponin I 0.25 (HH) <0.03 ng/mL    Comment: CRITICAL VALUE NOTED. VALUE IS CONSISTENT WITH PREVIOUSLY REPORTED/CALLED VALUE / [facility] Performed at [facility], [address] [place], [place] [address]   Troponin I     Status: Abnormal   Collection Time: 12/26/17 12:13 AM  Result Value Ref Range   Troponin I 0.24 (HH) <0.03 ng/mL    Comment: CRITICAL VALUE NOTED. VALUE IS CONSISTENT WITH PREVIOUSLY REPORTED/CALLED VALUE / [facility] Performed at [facility], [address] [place], [place] [address]   Basic metabolic panel     Status: Abnormal   Collection Time: 12/26/17  5:52 AM  Result Value Ref Range   Sodium 138 135 - 145 mmol/L   Potassium 3.7 3.5 - 5.1 mmol/L   Chloride 104 101 - 111 mmol/L   CO2 27 22 - 32 mmol/L   Glucose, Bld 134 (H) 65 - 99 mg/dL   BUN 19 6 - 20 mg/dL   Creatinine, Ser 0.52 0.44 - 1.00 mg/dL   Calcium 8.8 (L) 8.9 - 10.3 mg/dL   GFR calc non Af Amer >60 >60 mL/min   GFR calc Af Amer >60 >60 mL/min    Comment: (NOTE) The eGFR has been calculated using the CKD EPI equation. This calculation has not been validated in all clinical situations. eGFR's persistently <60 mL/min signify possible Chronic Kidney Disease.    Anion gap 7 5 - 15    Comment: Performed at [facility], [address] [place], [place] [address]  CBC     Status: None   Collection Time: 12/26/17  5:52 AM  Result Value Ref Range   WBC 10.1 3.6 - 11.0 K/uL   RBC 3.97 3.80 - 5.20 MIL/uL   Hemoglobin 12.8 12.0 - 16.0 g/dL   HCT 37.2 35.0 - 47.0 %   MCV 93.6 80.0 - 100.0 fL   MCH 32.3 26.0 - 34.0 pg   MCHC 34.5 32.0 - 36.0 g/dL   RDW 13.7 11.5 - 14.5 %   Platelets 336 150 - 440 K/uL    Comment: Performed at [facility], [address] [place], [place] [address]  Glucose, capillary     Status: Abnormal   Collection Time: 12/26/17  7:38 AM  Result Value Ref Range   Glucose-Capillary 104  (H) 65 - 99 mg/dL  Glucose, capillary     Status: Abnormal   Collection Time: 12/26/17 11:40 AM  Result Value Ref Range   Glucose-Capillary 178 (H) 65 - 99 mg/dL  Glucose, capillary     Status: Abnormal   Collection Time: 12/26/17  4:36 PM  Result Value Ref Range   Glucose-Capillary 137 (H) 65 - 99 mg/dL  Current Facility-Administered Medications  Medication Dose Route Frequency Provider Last Rate Last Dose  . acetaminophen (TYLENOL) tablet 650 mg  650 mg Oral Q4H PRN Vaughan Basta, MD      . Ampicillin-Sulbactam (UNASYN) 3 g in sodium chloride 0.9 % 100 mL IVPB  3 g Intravenous Q6H Coffee, Garrett, RPH 200 mL/hr at 12/26/17 1726 3 g at 12/26/17 1726  . aspirin chewable tablet 81 mg  81 mg Oral Daily Vaughan Basta, MD   81 mg at 12/26/17 0851  . busPIRone (BUSPAR) tablet 7.5 mg  7.5 mg Oral BID Vaughan Basta, MD   7.5 mg at 12/26/17 0852  . [START ON 12/29/2017] cholecalciferol (VITAMIN D) tablet 5,000 Units  5,000 Units Oral Q30 days Vaughan Basta, MD      . docusate sodium (COLACE) capsule 100 mg  100 mg Oral BID PRN Vaughan Basta, MD      . gabapentin (NEURONTIN) capsule 100 mg  100 mg Oral QHS Vaughan Basta, MD   100 mg at 12/25/17 2239  . heparin injection 5,000 Units  5,000 Units Subcutaneous Q8H Vaughan Basta, MD   5,000 Units at 12/26/17 9562  . hydrocortisone 2.5 % cream 1 application  1 application Topical TID PRN Vaughan Basta, MD      . hydrOXYzine (ATARAX/VISTARIL) tablet 25 mg  25 mg Oral Q6H PRN Vaughan Basta, MD      . insulin aspart (novoLOG) injection 0-9 Units  0-9 Units Subcutaneous TID WC Vaughan Basta, MD   2 Units at 12/26/17 1156  . insulin glargine (LANTUS) injection 35 Units  35 Units Subcutaneous BID Vaughan Basta, MD   35 Units at 12/26/17 [phone]  . lamoTRIgine (LAMICTAL) tablet 100 mg  100 mg Oral Q12H Vaughan Basta, MD   100 mg at 12/26/17 0851  .  metFORMIN (GLUCOPHAGE) tablet 1,000 mg  1,000 mg Oral BID WC Vaughan Basta, MD   1,000 mg at 12/26/17 1725  . miconazole (MICOTIN) 2 % powder   Topical BID PRN Vaughan Basta, MD      . multivitamin-lutein (OCUVITE-LUTEIN) capsule 1 capsule  1 capsule Oral Daily Pyreddy, Reatha Harps, MD   1 capsule at 12/26/17 1726  . OLANZapine (ZYPREXA) tablet 5 mg  5 mg Oral BID Sydelle Sherfield T, MD      . oxyCODONE-acetaminophen (PERCOCET/ROXICET) 5-325 MG per tablet 1 tablet  1 tablet Oral Q6H PRN Vaughan Basta, MD   1 tablet at 12/25/17 2244  . pantoprazole (PROTONIX) EC tablet 40 mg  40 mg Oral Daily Vaughan Basta, MD   40 mg at 12/26/17 0850  . pneumococcal 23 valent vaccine (PNU-IMMUNE) injection 0.5 mL  0.5 mL Intramuscular Tomorrow-1000 Vaughan Basta, MD      . venlafaxine XR (EFFEXOR-XR) 24 hr capsule 150 mg  150 mg Oral Q breakfast Vaughan Basta, MD   150 mg at 12/26/17 6578    Musculoskeletal: Strength & Muscle Tone: decreased Gait & Station: unsteady Patient leans: N/A  Psychiatric Specialty Exam: Physical Exam  Nursing note and vitals reviewed. Constitutional: She appears well-developed and well-nourished.  HENT:  Head: Normocephalic and atraumatic.  Eyes: Pupils are equal, round, and reactive to light. Conjunctivae are normal.  Neck: Normal range of motion.  Cardiovascular: Regular rhythm and normal heart sounds.  Respiratory: Effort normal. No respiratory distress.  GI: Soft.  Musculoskeletal: Normal range of motion.  Neurological: She is alert.  Skin: Skin is warm and dry.  Psychiatric: Judgment normal. Her mood appears anxious. Her affect is labile. Her speech is  rapid and/or pressured and tangential. She is agitated. She is not aggressive. Thought content is paranoid and delusional. Cognition and memory are impaired. She expresses no homicidal and no suicidal ideation.    Review of Systems  Constitutional: Negative.   HENT: Negative.    Eyes: Negative.   Respiratory: Negative.   Cardiovascular: Negative.   Gastrointestinal: Negative.   Musculoskeletal: Positive for falls.  Skin: Negative.   Neurological: Negative.   Psychiatric/Behavioral: Negative for depression, hallucinations, memory loss, substance abuse and suicidal ideas. The patient has insomnia. The patient is not nervous/anxious.     Blood pressure (!) 165/80, pulse (!) 109, temperature 97.8 F (36.6 C), temperature source Oral, resp. rate 16, height 5' (1.524 m), weight 111.6 kg (246 lb), SpO2 92 %.Body mass index is 48.04 kg/m.  General Appearance: Casual  Eye Contact:  Good  Speech:  Pressured  Volume:  Increased  Mood:  Irritable  Affect:  Labile  Thought Process:  Disorganized  Orientation:  Other:  Seems to get confused at times.  Mostly she seemed to be oriented but towards the end of the conversation she started insisting that this building where we are now he is [facility].  Thought Content:  Illogical, Delusions, Paranoid Ideation, Rumination and Tangential  Suicidal Thoughts:  No  Homicidal Thoughts:  No  Memory:  Immediate;   Fair Recent;   Poor Remote;   Poor  Judgement:  Impaired  Insight:  Shallow  Psychomotor Activity:  Decreased  Concentration:  Concentration: Fair  Recall:  Poor  Fund of Knowledge:  Fair  Language:  Fair  Akathisia:  No  Handed:  Right  AIMS (if indicated):     Assets:  Chief Executive Officer Social Support  ADL's:  Impaired  Cognition:  Impaired,  Mild  Sleep:        Treatment Plan Summary: Medication management and Plan This is a 67 year old woman with a uncertain past psychiatric history although it does sound like it is clear that she has had problems over the years.  She seems to be presenting with chronic shifting delusions confusion and hallucinations although she does not appear to be delirious and it does not appear to be causing major problems in her living environment.  I am going to go out  on a limb and say this patient may have met criteria for schizoaffective disorder by what I am hearing and she seems to be in a manic-like state with more psychosis.  She does not however meet criteria for hospitalization does not require inpatient treatment.  I proposed to the daughter that adding an antipsychotic back would be a reasonable thing to do.  She is agreeable.  I am going to add Zyprexa 5 mg twice a day.  Acknowledge that this runs a risk of increasing her blood sugars but she has tolerated this medicine in the past and it also has a relatively low risk of causing falls.  We will do 5 mg twice a day.  This should be added to her medicine at discharge and they can continue it at [facility].  Daughter tells me she is making arrangements for an outpatient psychiatry appointment.  Disposition: No evidence of imminent risk to self or others at present.   Patient does not meet criteria for psychiatric inpatient admission. Supportive therapy provided about ongoing stressors.  Alethia Berthold, MD 12/26/2017 5:39 PM

## 2017-12-27 LAB — GLUCOSE, CAPILLARY
GLUCOSE-CAPILLARY: 138 mg/dL — AB (ref 65–99)
Glucose-Capillary: 120 mg/dL — ABNORMAL HIGH (ref 65–99)

## 2017-12-27 LAB — HIV ANTIBODY (ROUTINE TESTING W REFLEX): HIV Screen 4th Generation wRfx: NONREACTIVE

## 2017-12-27 MED ORDER — OLANZAPINE 5 MG PO TABS: 5.0000 mg | ORAL_TABLET | Freq: Two times a day (BID) | ORAL | 0 refills | Status: DC

## 2017-12-27 MED ORDER — AMOXICILLIN-POT CLAVULANATE 875-125 MG PO TABS: 1.0000 | ORAL_TABLET | Freq: Two times a day (BID) | ORAL | 0 refills | Status: AC

## 2017-12-27 MED ORDER — BUSPIRONE HCL 7.5 MG PO TABS: 15.0000 mg | ORAL_TABLET | Freq: Two times a day (BID) | ORAL | 0 refills | Status: DC

## 2017-12-27 NOTE — Progress Notes (Signed)
Please call her daughter when she is taken to [facility].  Patient needs ambulance transport upon discharge.  [phone] Amil Amen

## 2017-12-27 NOTE — Progress Notes (Signed)
RN removed IV.  EMS picked up the patient to transport her back to [facility].  Amil Amen (daughter) was notified of discharge and transport.  Christean Grief, RN

## 2017-12-27 NOTE — Discharge Summary (Signed)
[facility] NAME: Destiny Cole    MR#:  [id number]  DATE OF BIRTH:  Mar 13, 1951  DATE OF ADMISSION:  12/25/2017 ADMITTING PHYSICIAN: Vaughan Basta, MD  DATE OF DISCHARGE: 12/27/2017  PRIMARY CARE PHYSICIAN: Venia Carbon, MD   ADMISSION DIAGNOSIS:  Elevated troponin I level [R74.8] Syncope, unspecified syncope type [R55]  DISCHARGE DIAGNOSIS:  Accidental fall without syncope Cerebral palsy Diabetes mellitus type 2 Hypertension Schizoaffective disorder SECONDARY DIAGNOSIS:   Past Medical History:  Diagnosis Date  . Cerebral palsy ([place])   . CHF (congestive heart failure) ([place])   . Diabetes mellitus without complication ([place])   . Hypertension      ADMITTING HISTORY Destiny Cole  is a 67 y.o. female with a known history of cerebral palsy, CHF, diabetes, hypertension, obesity- lives in [facility] facility for last 1 year and walks with a walker over there. Today while walking from the bathroom with walker had suddenly her right leg gave away and she fell down, she passed out after she fell andregained her consciousness soon and try to get up but could not and passed out again.Finally with these complaints she was brought to emergency room. She was alert and oriented in ER without much complaint except for lower back pain which showed no fracture on x-ray.Noted to have elevated troponin in ER. So she is given to admit him to hospitalist team for further management.    HOSPITAL COURSE:  Patient was admitted to telemetry.  Serial troponins were trended cardiology evaluation was done.  According to cardiology the elevated troponin is not consistent with acute coronary syndrome.  Patient had a fall but not syncope.  She also received IV Unasyn antibiotic for multiple skin infection sites.  Later it was switched to oral Augmentin antibiotic.  Patient was also evaluated by psychiatry for hallucinations and chronic  shifting delusions according to psychiatry expert opinion patient.  Does have schizoaffective disorder and patient is started on Zyprexa 5 mg orally twice a day.  MRSA PCR is negative.  Patient hemodynamically stable will be discharged back to [facility] facility.  EKG normal sinus rhythm with no ST segment changes.  CONSULTS OBTAINED:  Treatment Team:  Corey Skains, MD Clapacs, Madie Reno, MD  DRUG ALLERGIES:   Allergies  Allergen Reactions  . Promethazine     She has a possible respiratory reaction with this medication with RR 4 and hypoxia a few minutes after administration. Other respiratory depressants like benzos should be used with caution.  . Bactrim [Sulfamethoxazole-Trimethoprim] Itching    And rash  . Sulfa Antibiotics Swelling and Rash    DISCHARGE MEDICATIONS:   Allergies as of 12/27/2017      Reactions   Promethazine    She has a possible respiratory reaction with this medication with RR 4 and hypoxia a few minutes after administration. Other respiratory depressants like benzos should be used with caution.   Bactrim [sulfamethoxazole-trimethoprim] Itching   And rash   Sulfa Antibiotics Swelling, Rash      Medication List    STOP taking these medications   fluconazole 100 MG tablet Commonly known as:  DIFLUCAN   FLUoxetine 20 MG capsule Commonly known as:  PROZAC   insulin aspart cartridge Commonly known as:  NOVOLOG   loperamide 2 MG capsule Commonly known as:  IMODIUM   ondansetron 4 MG tablet Commonly known as:  ZOFRAN   oxyCODONE-acetaminophen 5-325 MG tablet Commonly known as:  PERCOCET/ROXICET  pantoprazole 40 MG tablet Commonly known as:  PROTONIX     TAKE these medications   acetaminophen 325 MG tablet Commonly known as:  TYLENOL Take 650 mg by mouth every 4 (four) hours as needed for mild pain or fever.   amoxicillin-clavulanate 875-125 MG tablet Commonly known as:  AUGMENTIN Take 1 tablet by mouth 2 (two) times daily for 5 days.    aspirin 81 MG chewable tablet Chew 81 mg by mouth daily.   B-12 COMPLIANCE INJECTION 1000 MCG/ML Kit Generic drug:  Cyanocobalamin Inject 1,000 mcg as directed every 30 (thirty) days.   busPIRone 7.5 MG tablet Commonly known as:  BUSPAR Take 2 tablets (15 mg total) by mouth 2 (two) times daily.   Cranberry 425 MG Caps Take 1 tablet by mouth daily.   diclofenac sodium 1 % Gel Commonly known as:  VOLTAREN Apply 2 g topically 2 (two) times daily. Apply to knees and right hand.   gabapentin 100 MG capsule Commonly known as:  NEURONTIN Take 100 mg by mouth at bedtime.   hydrocortisone 2.5 % cream Apply 1 application topically 3 (three) times daily as needed (rash).   hydrOXYzine 25 MG tablet Commonly known as:  ATARAX/VISTARIL Take 25 mg by mouth every 6 (six) hours as needed for itching.   insulin glargine 100 UNIT/ML injection Commonly known as:  LANTUS Inject 45 Units into the skin 2 (two) times daily.   lamoTRIgine 100 MG tablet Commonly known as:  LAMICTAL Take 100 mg by mouth every 12 (twelve) hours.   metFORMIN 1000 MG tablet Commonly known as:  GLUCOPHAGE Take 1,000 mg by mouth 2 (two) times daily with a meal.   miconazole 2 % powder Commonly known as:  MICOTIN Apply topically daily as needed for itching. Apply under abdomen in folds and red areas daily as needed.   naproxen sodium 220 MG tablet Commonly known as:  ALEVE Take 220 mg by mouth 2 (two) times daily as needed (pain).   OLANZapine 5 MG tablet Commonly known as:  ZYPREXA Take 1 tablet (5 mg total) by mouth 2 (two) times daily.   potassium chloride 10 MEQ tablet Commonly known as:  K-DUR Take 1 tablet (10 mEq total) by mouth daily. What changed:  how much to take   venlafaxine XR 150 MG 24 hr capsule Commonly known as:  EFFEXOR-XR Take 150 mg by mouth daily with breakfast.   Vitamin D3 50000 units Caps Take 1 tablet by mouth every 30 (thirty) days. On the 24th.       Today  Patient  seen and evaluated on the day of discharge No chest pain No shortness of breath No fever or chills  VITAL SIGNS:  Blood pressure 114/71, pulse (!) 111, temperature 97.9 F (36.6 C), resp. rate 17, height 5' (1.524 m), weight 112.6 kg (248 lb 4.8 oz), SpO2 95 %.  I/O:    Intake/Output Summary (Last 24 hours) at 12/27/2017 1251 Last data filed at 12/26/2017 1844 Gross per 24 hour  Intake 240 ml  Output -  Net 240 ml    PHYSICAL EXAMINATION:  Physical Exam  GENERAL:  67 y.o.-year-old patient lying in the bed with no acute distress.  LUNGS: Normal breath sounds bilaterally, no wheezing, rales,rhonchi or crepitation. No use of accessory muscles of respiration.  CARDIOVASCULAR: S1, S2 normal. No murmurs, rubs, or gallops.  ABDOMEN: Soft, non-tender, non-distended. Bowel sounds present. No organomegaly or mass.  NEUROLOGIC: Moves all 4 extremities. PSYCHIATRIC: The patient is alert and  oriented x 3.  SKIN: No obvious rash, lesion, or ulcer.   DATA REVIEW:   CBC Recent Labs  Lab 12/26/17 0552  WBC 10.1  HGB 12.8  HCT 37.2  PLT 336    Chemistries  Recent Labs  Lab 12/25/17 1115 12/26/17 0552  NA 136 138  K 4.2 3.7  CL 102 104  CO2 25 27  GLUCOSE 190* 134*  BUN 28* 19  CREATININE 0.70 0.52  CALCIUM 9.0 8.8*  AST 29  --   ALT 26  --   ALKPHOS 90  --   BILITOT 0.5  --     Cardiac Enzymes Recent Labs  Lab 12/26/17 0013  TROPONINI 0.24*    Microbiology Results  Results for orders placed or performed during the hospital encounter of 12/25/17  MRSA PCR Screening     Status: None   Collection Time: 12/25/17  6:45 PM  Result Value Ref Range Status   MRSA by PCR NEGATIVE NEGATIVE Final    Comment:        The GeneXpert MRSA Assay (FDA approved for NASAL specimens only), is one component of a comprehensive MRSA colonization surveillance program. It is not intended to diagnose MRSA infection nor to guide or monitor treatment for MRSA infections. Performed  at [facility], [address] [place], [place] [address]     RADIOLOGY:  Dg Chest 2 View  Result Date: 12/25/2017 CLINICAL DATA:  Fall. EXAM: CHEST - 2 VIEW COMPARISON:  Radiograph of October 21, 2017. FINDINGS: Stable cardiomegaly. No pneumothorax or pleural effusion is noted. Both lungs are clear. The visualized skeletal structures are unremarkable. IMPRESSION: No active cardiopulmonary disease. Electronically Signed   By: Marijo Conception, M.D.   On: 12/25/2017 14:56   Dg Lumbar Spine 2-3 Views  Result Date: 12/25/2017 CLINICAL DATA:  Fall with low back pain. EXAM: LUMBAR SPINE - 2-3 VIEW COMPARISON:  07/08/2015 FINDINGS: No evidence of acute fracture or traumatic malalignment. Lower lumbar facet spurring with slight L4-5 anterolisthesis. Osteopenia and arterial calcification. IMPRESSION: No acute finding. Electronically Signed   By: Monte Fantasia M.D.   On: 12/25/2017 14:54   Ct Head Wo Contrast  Result Date: 12/25/2017 CLINICAL DATA:  Altered level of consciousness. EXAM: CT HEAD WITHOUT CONTRAST TECHNIQUE: Contiguous axial images were obtained from the base of the skull through the vertex without intravenous contrast. COMPARISON:  04/05/2015. FINDINGS: Brain: Large area of encephalomalacia in the left centrum semiovale is again identified and does not appear significantly changed in the interval. There is mild diffuse low-attenuation within the subcortical and periventricular white matter compatible with chronic microvascular disease. No evidence of acute infarction, hemorrhage, hydrocephalus, extra-axial collection or mass lesion/mass effect. Vascular: No hyperdense vessel or unexpected calcification. Skull: Normal. Negative for fracture or focal lesion. Sinuses/Orbits: No acute finding. Other: None. IMPRESSION: 1. No acute intracranial abnormalities. 2. 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 3. Chronic small vessel ischemic change. Electronically Signed    By: Kerby Moors M.D.   On: 12/25/2017 16:34    Follow up with PCP in 1 week.  Management plans discussed with the patient, family and they are in agreement.  CODE STATUS:     Code Status Orders  (From admission, onward)        Start     Ordered   12/25/17 1830  Limited resuscitation (code)  Continuous    Question Answer Comment  In the event of cardiac or respiratory ARREST: Initiate Code Blue, Call Rapid  Response Yes   In the event of cardiac or respiratory ARREST: Perform CPR Yes   In the event of cardiac or respiratory ARREST: Perform Intubation/Mechanical Ventilation No   In the event of cardiac or respiratory ARREST: Use NIPPV/BiPAp only if indicated Yes   In the event of cardiac or respiratory ARREST: Administer ACLS medications if indicated Yes   In the event of cardiac or respiratory ARREST: Perform Defibrillation or Cardioversion if indicated Yes      12/25/17 1829    Code Status History    Date Active Date Inactive Code Status Order ID Comments User Context   05/08/2015 1533 05/09/2015 0319 Full Code [id number]  Sabino Dick, MD HOV   04/19/2015 0516 04/24/2015 1540 DNR [id number]  Harrie Foreman, MD Inpatient   04/05/2015 2141 04/08/2015 2122 Partial Code [id number]  Rise Patience, MD ED   10/29/2012 1152 10/30/2012 1945 Full Code [id number]  Jessee Avers, MD Inpatient   10/29/2012 0053 10/29/2012 1152 Partial Code [id number]  Neta Ehlers, MD Inpatient    Advance Directive Documentation     Most Recent Value  Type of Advance Directive  Healthcare Power of Attorney, Living will  Pre-existing out of facility DNR order (yellow form or pink MOST form)  -  "MOST" Form in Place?  -      TOTAL TIME TAKING CARE OF THIS PATIENT ON DAY OF DISCHARGE: more than 35 minutes.   Saundra Shelling M.D on 12/27/2017 at 12:51 PM  Between 7am to 6pm - Pager - [phone]  After 6pm go to [email or web address] - password [email or web address] [facility]  Office   [phone]  CC: Primary care physician; Venia Carbon, MD  Note: This dictation was prepared with Dragon dictation along with smaller phrase technology. Any transcriptional errors that result from this process are unintentional.

## 2017-12-27 NOTE — Clinical Social Work Note (Addendum)
Patient to be d/c'ed today to [facility] room 206A.  Patient and family agreeable to plans will transport via ems RN to call report to [phone].  CSW updated patient's daughter Amil Amen [phone] that patient will be discharging today.  Windell Moulding, MSW, Theresia Majors [phone]

## 2017-12-28 DIAGNOSIS — R55 Syncope and collapse: Secondary | ICD-10-CM | POA: Diagnosis not present

## 2017-12-28 DIAGNOSIS — F259 Schizoaffective disorder, unspecified: Secondary | ICD-10-CM | POA: Diagnosis not present

## 2018-01-09 ENCOUNTER — Emergency Department
Admission: EM | Admit: 2018-01-09 | Discharge: 2018-01-09 | Disposition: A | Discharge status: Home / Self Care | Payer: Medicaid Other | Attending: Emergency Medicine | Admitting: Emergency Medicine

## 2018-01-09 ENCOUNTER — Other Ambulatory Visit: Payer: Self-pay

## 2018-01-09 ENCOUNTER — Emergency Department: Payer: Medicaid Other

## 2018-01-09 DIAGNOSIS — I509 Heart failure, unspecified: Secondary | ICD-10-CM | POA: Diagnosis not present

## 2018-01-09 DIAGNOSIS — F259 Schizoaffective disorder, unspecified: Secondary | ICD-10-CM | POA: Diagnosis not present

## 2018-01-09 DIAGNOSIS — Z7982 Long term (current) use of aspirin: Secondary | ICD-10-CM | POA: Diagnosis not present

## 2018-01-09 DIAGNOSIS — G809 Cerebral palsy, unspecified: Secondary | ICD-10-CM | POA: Diagnosis not present

## 2018-01-09 DIAGNOSIS — E119 Type 2 diabetes mellitus without complications: Secondary | ICD-10-CM | POA: Insufficient documentation

## 2018-01-09 DIAGNOSIS — N390 Urinary tract infection, site not specified: Secondary | ICD-10-CM | POA: Diagnosis not present

## 2018-01-09 DIAGNOSIS — I110 Hypertensive heart disease with heart failure: Secondary | ICD-10-CM | POA: Insufficient documentation

## 2018-01-09 DIAGNOSIS — F99 Mental disorder, not otherwise specified: Secondary | ICD-10-CM | POA: Diagnosis present

## 2018-01-09 DIAGNOSIS — Z794 Long term (current) use of insulin: Secondary | ICD-10-CM | POA: Diagnosis not present

## 2018-01-09 DIAGNOSIS — Z79899 Other long term (current) drug therapy: Secondary | ICD-10-CM | POA: Diagnosis not present

## 2018-01-09 LAB — GLUCOSE, CAPILLARY
GLUCOSE-CAPILLARY: 73 mg/dL (ref 65–99)
Glucose-Capillary: 68 mg/dL (ref 65–99)

## 2018-01-09 LAB — CBC WITH DIFFERENTIAL/PLATELET
BASOS ABS: 0 10*3/uL (ref 0–0.1)
BASOS PCT: 0 %
Eosinophils Absolute: 0.2 10*3/uL (ref 0–0.7)
Eosinophils Relative: 2 %
HCT: 35.7 % (ref 35.0–47.0)
HEMOGLOBIN: 11.7 g/dL — AB (ref 12.0–16.0)
Lymphocytes Relative: 21 %
Lymphs Abs: 2.4 10*3/uL (ref 1.0–3.6)
MCH: 31.1 pg (ref 26.0–34.0)
MCHC: 32.8 g/dL (ref 32.0–36.0)
MCV: 94.9 fL (ref 80.0–100.0)
MONOS PCT: 8 %
Monocytes Absolute: 0.9 10*3/uL (ref 0.2–0.9)
NEUTROS ABS: 8.1 10*3/uL — AB (ref 1.4–6.5)
NEUTROS PCT: 69 %
Platelets: 426 10*3/uL (ref 150–440)
RBC: 3.76 MIL/uL — ABNORMAL LOW (ref 3.80–5.20)
RDW: 14.2 % (ref 11.5–14.5)
WBC: 11.7 10*3/uL — ABNORMAL HIGH (ref 3.6–11.0)

## 2018-01-09 LAB — COMPREHENSIVE METABOLIC PANEL
ALK PHOS: 80 U/L (ref 38–126)
ALT: 22 U/L (ref 14–54)
ANION GAP: 9 (ref 5–15)
AST: 22 U/L (ref 15–41)
Albumin: 3.7 g/dL (ref 3.5–5.0)
BILIRUBIN TOTAL: 0.4 mg/dL (ref 0.3–1.2)
BUN: 37 mg/dL — ABNORMAL HIGH (ref 6–20)
CALCIUM: 9 mg/dL (ref 8.9–10.3)
CO2: 23 mmol/L (ref 22–32)
CREATININE: 0.7 mg/dL (ref 0.44–1.00)
Chloride: 110 mmol/L (ref 101–111)
Glucose, Bld: 66 mg/dL (ref 65–99)
Potassium: 3.5 mmol/L (ref 3.5–5.1)
Sodium: 142 mmol/L (ref 135–145)
TOTAL PROTEIN: 7.7 g/dL (ref 6.5–8.1)

## 2018-01-09 LAB — URINALYSIS, ROUTINE W REFLEX MICROSCOPIC
BILIRUBIN URINE: NEGATIVE
Glucose, UA: NEGATIVE mg/dL
HGB URINE DIPSTICK: NEGATIVE
Ketones, ur: NEGATIVE mg/dL
Nitrite: NEGATIVE
PH: 5 (ref 5.0–8.0)
Protein, ur: 100 mg/dL — AB
SPECIFIC GRAVITY, URINE: 1.026 (ref 1.005–1.030)
Squamous Epithelial / LPF: NONE SEEN (ref 0–5)

## 2018-01-09 LAB — LACTIC ACID, PLASMA: Lactic Acid, Venous: 1.7 mmol/L (ref 0.5–1.9)

## 2018-01-09 LAB — TROPONIN I: Troponin I: 0.04 ng/mL (ref <0.03)

## 2018-01-09 MED ORDER — CEFTRIAXONE SODIUM 1 G IJ SOLR
1.0000 g | Freq: Once | INTRAMUSCULAR | Status: AC
  Administered 2018-01-09: 1 g via INTRAVENOUS
  Filled 2018-01-09: qty 10

## 2018-01-09 MED ORDER — CEPHALEXIN 500 MG PO CAPS: 500.0000 mg | ORAL_CAPSULE | Freq: Two times a day (BID) | ORAL | 0 refills | Status: AC

## 2018-01-09 NOTE — ED Notes (Signed)
Date and time results received: 01/09/18 11:31 AM  Troponin 0.04 Verbal in person Dr. Jene Everyobert Kinner

## 2018-01-09 NOTE — ED Triage Notes (Signed)
To ER via [facility] from [facility] for violent and combative behavior. Pt having visual hallucinations. Wound to right lower leg present on arrival, unwrapped and foul odor from wound.

## 2018-01-09 NOTE — ED Notes (Signed)
Daughter, Amil AmenJulia unable to transport home due to patient being confined to bed or wheelchair.

## 2018-01-09 NOTE — ED Notes (Signed)
Pt back to [facility] with EMS. All of belongings sent with patient home. Pt clean and dry upon discharge. Wound rewrapped.

## 2018-01-09 NOTE — ED Notes (Signed)
Pt had insulin this AM without eating per EMS report. CBG checked. Pt given 4 oz soda.

## 2018-01-09 NOTE — ED Notes (Signed)
Ate all of lunch tray and 8oz water. Awaiting EMS. Resting at this time.

## 2018-01-09 NOTE — ED Notes (Signed)
Spoke with Burna MortimerWanda at [facility] and gave reports. She states that the daughter of patient did not want daughter to return to [facility]. Daughter, Amil AmenJulia has been here in ER and present in conversation of returning back to [facility] with no other plans. [facility] staff to call daughter and return phone call back to this RN with further plan for disposition.

## 2018-01-09 NOTE — ED Provider Notes (Signed)
[facility] Emergency Department Provider Note   ____________________________________________    I have reviewed the triage vital signs and the nursing notes.   HISTORY  Chief Complaint Violent Behavior     HPI Destiny Cole is a 67 y.o. female with a history of diabetes, schizoaffective disorder who presents today with reports of abnormal behavior.  Reportedly the patient was "acting out ".  This has been a relatively acute change.  Although the patient does have a history of this in the past.  Has been admitted to the psychiatric floor before.  No reports of fevers or chills.  Patient reports he feels what quite well and has no complaints.  She is quite calm in the emergency department.   Past Medical History:  Diagnosis Date  . Cerebral palsy ([place])   . CHF (congestive heart failure) ([place])   . Diabetes mellitus without complication ([place])   . Hypertension     Patient Active Problem List   Diagnosis Date Noted  . Schizoaffective disorder ([place]) 12/26/2017  . Syncopal episodes 12/25/2017  . Syncope 12/25/2017  . Delirium 10/23/2017  . GERD (gastroesophageal reflux disease) 10/25/2016  . 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 Facial droop 04/06/2015  . Anxiety state 04/06/2015  . DKA, type 2 ([place]) 04/05/2015  . Hyperlipidemia 04/05/2015  . Physical deconditioning   . Diabetes mellitus without complication ([place])   . Cerebral palsy ([place])   . Hypertension     Past Surgical History:  Procedure Laterality Date  . ABDOMINAL HYSTERECTOMY    . cerbral palsy    . MOUTH SURGERY      Prior to Admission medications   Medication Sig Start Date End Date Taking? Authorizing Provider  acetaminophen (TYLENOL) 325 MG tablet Take 650 mg by mouth every 4 (four) hours as needed for mild pain or fever.    Yes [provider]  antiseptic oral rinse (BIOTENE) LIQD 15 mLs by Mouth Rinse route as needed for dry mouth.   Yes  [provider]  aspirin 81 MG chewable tablet Chew 81 mg by mouth daily.   Yes [provider]  busPIRone (BUSPAR) 7.5 MG tablet Take 2 tablets (15 mg total) by mouth 2 (two) times daily. 12/27/17 01/26/18 Yes Pyreddy, Reatha Harps, MD  Cholecalciferol (VITAMIN D3) 50000 units CAPS Take 1 tablet by mouth every 30 (thirty) days. On the 24th.   Yes [provider]  Cranberry 425 MG CAPS Take 1 tablet by mouth daily.   Yes [provider]  Cyanocobalamin (B-12 COMPLIANCE INJECTION) 1000 MCG/ML KIT Inject 1,000 mcg as directed every 30 (thirty) days.   Yes [provider]  diclofenac sodium (VOLTAREN) 1 % GEL Apply 2 g topically 2 (two) times daily. Apply to knees and right hand.   Yes [provider]  gabapentin (NEURONTIN) 100 MG capsule Take 100 mg by mouth at bedtime.   Yes [provider]  hydrocortisone 2.5 % cream Apply 1 application topically 3 (three) times daily as needed (rash).   Yes [provider]  hydrOXYzine (ATARAX/VISTARIL) 25 MG tablet Take 25 mg by mouth every 6 (six) hours as needed for itching.    Yes [provider]  insulin glargine (LANTUS) 100 UNIT/ML injection Inject 45 Units into the skin 2 (two) times daily.   Yes [provider]  lamoTRIgine (LAMICTAL) 100 MG tablet Take 100 mg by mouth every 12 (twelve) hours.    Yes [provider]  metFORMIN (  GLUCOPHAGE) 1000 MG tablet Take 1,000 mg by mouth 2 (two) times daily with a meal.   Yes [provider]  miconazole (MICOTIN) 2 % powder Apply topically daily as needed for itching. Apply under abdomen in folds and red areas daily as needed.   Yes [provider]  naproxen sodium (ALEVE) 220 MG tablet Take 220 mg by mouth 2 (two) times daily as needed (pain).    Yes [provider]  OLANZapine (ZYPREXA) 5 MG tablet Take 1 tablet (5 mg total) by mouth 2 (two) times daily. 12/27/17 01/26/18 Yes Pyreddy, Reatha Harps, MD    potassium chloride (K-DUR) 10 MEQ tablet Take 1 tablet (10 mEq total) by mouth daily. 10/30/12  Yes Jessee Avers, MD  venlafaxine XR (EFFEXOR-XR) 150 MG 24 hr capsule Take 150 mg by mouth daily with breakfast.   Yes [provider]  cephALEXin (KEFLEX) 500 MG capsule Take 1 capsule (500 mg total) by mouth 2 (two) times daily. 01/09/18   Lavonia Drafts, MD     Allergies Promethazine; Bactrim [sulfamethoxazole-trimethoprim]; and Sulfa antibiotics  Family History  Problem Relation Age of Onset  . Breast cancer Mother   . Hypertension Mother   . Diabetes Mellitus II Maternal Aunt     Social History Social History   Tobacco Use  . Smoking status: Never Smoker  . Smokeless tobacco: Never Used  Substance Use Topics  . Alcohol use: No    Alcohol/week: 0.0 oz  . Drug use: No    Review of Systems  Constitutional: No fever/chills Eyes: No visual changes.  ENT: No sore throat. Cardiovascular: Denies chest pain. Respiratory: Denies shortness of breath. Gastrointestinal: No abdominal pain.  .   Genitourinary: Negative for dysuria. Musculoskeletal: Negative for back pain. Skin: Wound to the lower leg where the patient picks at her skin Neurological: Negative for headaches or weakness   ____________________________________________   PHYSICAL EXAM:  VITAL SIGNS: ED Triage Vitals [01/09/18 0912]  Enc Vitals Group     BP 133/64     Pulse Rate (!) 103     Resp 20     Temp 98 F (36.7 C)     Temp Source Oral     SpO2 93 %     Weight 113.4 kg (250 lb)     Height 1.524 m (5')     Head Circumference      Peak Flow      Pain Score 0     Pain Loc      Pain Edu?      Excl. in [place]?     Constitutional: Alert and oriented. No acute distress.  Calm Eyes: Conjunctivae are normal.   Nose: No congestion/rhinnorhea. Mouth/Throat: Mucous membranes are moist.    Cardiovascular: Normal rate, regular rhythm. Grossly normal heart sounds.  Good peripheral  circulation. Respiratory: Normal respiratory effort.  No retractions. Lungs CTAB. Gastrointestinal: Soft and nontender. No distention.  No CVA tenderness. Genitourinary: deferred Musculoskeletal:  Warm and well perfused Neurologic:  Normal speech and language. No gross focal neurologic deficits are appreciated.  Skin:  Skin is warm, dry.  Shallow abrasion right lower leg, laterally no evidence of infection Psychiatric: Mood and affect are normal. Speech and behavior are normal.  ____________________________________________   LABS (all labs ordered are listed, but only abnormal results are displayed)  Labs Reviewed  COMPREHENSIVE METABOLIC PANEL - Abnormal; Notable for the following components:      Result Value   BUN 37 (*)    All other  components within normal limits  CBC WITH DIFFERENTIAL/PLATELET - Abnormal; Notable for the following components:   WBC 11.7 (*)    RBC 3.76 (*)    Hemoglobin 11.7 (*)    Neutro Abs 8.1 (*)    All other components within normal limits  URINALYSIS, ROUTINE W REFLEX MICROSCOPIC - Abnormal; Notable for the following components:   Color, Urine AMBER (*)    APPearance CLOUDY (*)    Protein, ur 100 (*)    Leukocytes, UA LARGE (*)    WBC, UA >50 (*)    Bacteria, UA MANY (*)    Non Squamous Epithelial 0-5 (*)    All other components within normal limits  TROPONIN I - Abnormal; Notable for the following components:   Troponin I 0.04 (*)    All other components within normal limits  CULTURE, BLOOD (ROUTINE X 2)  CULTURE, BLOOD (ROUTINE X 2)  URINE CULTURE  GLUCOSE, CAPILLARY  LACTIC ACID, PLASMA  GLUCOSE, CAPILLARY   ____________________________________________  EKG  None ____________________________________________  RADIOLOGY  Chest x-ray shows stable cardiomegaly ____________________________________________   PROCEDURES  Procedure(s) performed: No  Procedures   Critical Care performed:  No ____________________________________________   INITIAL IMPRESSION / ASSESSMENT AND PLAN / ED COURSE  Pertinent labs & imaging results that were available during my care of the patient were reviewed by me and considered in my medical decision making (see chart for details).  Patient presents with reports of "acting out ".  She is calm and reserved here in the emergency department.  No physical complaints.  Lab work is overall reassuring, chronically elevated troponin not significant.  Urinalysis consistent with urinary tract infection, patient treated with IV Rocephin in the emergency department.  Will require 7 days of outpatient antibiotic's as well.  Daughter is here and agrees the patient is at her baseline.  Has outpatient follow-up with psychiatry arranged on Thursday.  Daughter agrees with discharge at this time   ____________________________________________   FINAL CLINICAL IMPRESSION(S) / ED DIAGNOSES  Final diagnoses:  Lower urinary tract infection        Note:  This document was prepared using Dragon voice recognition software and may include unintentional dictation errors.    Lavonia Drafts, MD 01/09/18 1320

## 2018-01-09 NOTE — ED Notes (Signed)
Pt given lunch tray.

## 2018-01-09 NOTE — ED Notes (Signed)
ED Provider at bedside. 

## 2018-01-11 ENCOUNTER — Encounter (HOSPITAL_COMMUNITY): Payer: Self-pay | Admitting: Psychiatry

## 2018-01-11 ENCOUNTER — Ambulatory Visit (INDEPENDENT_AMBULATORY_CARE_PROVIDER_SITE_OTHER): Payer: Medicaid Other | Admitting: Psychiatry

## 2018-01-11 VITALS — BP 142/80 | HR 85 | Ht 60.0 in | Wt 233.0 lb

## 2018-01-11 DIAGNOSIS — E119 Type 2 diabetes mellitus without complications: Secondary | ICD-10-CM

## 2018-01-11 DIAGNOSIS — R454 Irritability and anger: Secondary | ICD-10-CM

## 2018-01-11 DIAGNOSIS — G808 Other cerebral palsy: Secondary | ICD-10-CM

## 2018-01-11 DIAGNOSIS — K219 Gastro-esophageal reflux disease without esophagitis: Secondary | ICD-10-CM | POA: Diagnosis not present

## 2018-01-11 DIAGNOSIS — I509 Heart failure, unspecified: Secondary | ICD-10-CM

## 2018-01-11 DIAGNOSIS — Z818 Family history of other mental and behavioral disorders: Secondary | ICD-10-CM

## 2018-01-11 DIAGNOSIS — F250 Schizoaffective disorder, bipolar type: Secondary | ICD-10-CM | POA: Diagnosis not present

## 2018-01-11 DIAGNOSIS — R51 Headache: Secondary | ICD-10-CM

## 2018-01-11 DIAGNOSIS — F515 Nightmare disorder: Secondary | ICD-10-CM

## 2018-01-11 DIAGNOSIS — Z6841 Body Mass Index (BMI) 40.0 and over, adult: Secondary | ICD-10-CM

## 2018-01-11 DIAGNOSIS — Z560 Unemployment, unspecified: Secondary | ICD-10-CM

## 2018-01-11 DIAGNOSIS — G3184 Mild cognitive impairment, so stated: Secondary | ICD-10-CM

## 2018-01-11 DIAGNOSIS — F411 Generalized anxiety disorder: Secondary | ICD-10-CM

## 2018-01-11 DIAGNOSIS — Z915 Personal history of self-harm: Secondary | ICD-10-CM

## 2018-01-11 DIAGNOSIS — I110 Hypertensive heart disease with heart failure: Secondary | ICD-10-CM | POA: Diagnosis not present

## 2018-01-11 DIAGNOSIS — Z91410 Personal history of adult physical and sexual abuse: Secondary | ICD-10-CM

## 2018-01-11 DIAGNOSIS — Z91411 Personal history of adult psychological abuse: Secondary | ICD-10-CM

## 2018-01-11 DIAGNOSIS — Z9181 History of falling: Secondary | ICD-10-CM

## 2018-01-11 LAB — URINE CULTURE: Culture: >=100000 — AB

## 2018-01-11 MED ORDER — VENLAFAXINE HCL ER 150 MG PO CP24: 150.0000 mg | ORAL_CAPSULE | Freq: Every day | ORAL | 0 refills | Status: AC

## 2018-01-11 MED ORDER — HALOPERIDOL 5 MG PO TABS: ORAL_TABLET | ORAL | 0 refills | Status: AC

## 2018-01-11 MED ORDER — BUSPIRONE HCL 15 MG PO TABS: 15.0000 mg | ORAL_TABLET | Freq: Two times a day (BID) | ORAL | 0 refills | Status: AC

## 2018-01-11 MED ORDER — LAMOTRIGINE 150 MG PO TABS: 150.0000 mg | ORAL_TABLET | Freq: Two times a day (BID) | ORAL | 0 refills | Status: AC
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
## 2018-01-14 LAB — CULTURE, BLOOD (ROUTINE X 2)
CULTURE: NO GROWTH
Culture: NO GROWTH
SPECIAL REQUESTS: ADEQUATE
Special Requests: ADEQUATE

## 2018-01-15 ENCOUNTER — Telehealth (HOSPITAL_COMMUNITY): Payer: Self-pay

## 2018-01-15 ENCOUNTER — Emergency Department
Admission: EM | Admit: 2018-01-15 | Discharge: 2018-01-15 | Disposition: A | Discharge status: Skilled Nursing Facility | Payer: Medicaid Other | Attending: Emergency Medicine | Admitting: Emergency Medicine

## 2018-01-15 ENCOUNTER — Other Ambulatory Visit: Payer: Self-pay

## 2018-01-15 DIAGNOSIS — Z7984 Long term (current) use of oral hypoglycemic drugs: Secondary | ICD-10-CM | POA: Diagnosis not present

## 2018-01-15 DIAGNOSIS — R51 Headache: Secondary | ICD-10-CM | POA: Insufficient documentation

## 2018-01-15 DIAGNOSIS — E119 Type 2 diabetes mellitus without complications: Secondary | ICD-10-CM | POA: Insufficient documentation

## 2018-01-15 DIAGNOSIS — I509 Heart failure, unspecified: Secondary | ICD-10-CM | POA: Diagnosis not present

## 2018-01-15 DIAGNOSIS — Z7982 Long term (current) use of aspirin: Secondary | ICD-10-CM | POA: Diagnosis not present

## 2018-01-15 DIAGNOSIS — I110 Hypertensive heart disease with heart failure: Secondary | ICD-10-CM | POA: Insufficient documentation

## 2018-01-15 DIAGNOSIS — R079 Chest pain, unspecified: Secondary | ICD-10-CM | POA: Insufficient documentation

## 2018-01-15 DIAGNOSIS — R4182 Altered mental status, unspecified: Secondary | ICD-10-CM | POA: Diagnosis present

## 2018-01-15 DIAGNOSIS — N390 Urinary tract infection, site not specified: Secondary | ICD-10-CM | POA: Diagnosis not present

## 2018-01-15 DIAGNOSIS — Z79899 Other long term (current) drug therapy: Secondary | ICD-10-CM | POA: Insufficient documentation

## 2018-01-15 LAB — URINALYSIS, COMPLETE (UACMP) WITH MICROSCOPIC
BILIRUBIN URINE: NEGATIVE
Glucose, UA: NEGATIVE mg/dL
KETONES UR: NEGATIVE mg/dL
Nitrite: NEGATIVE
PROTEIN: 100 mg/dL — AB
Specific Gravity, Urine: 1.021 (ref 1.005–1.030)
pH: 5 (ref 5.0–8.0)

## 2018-01-15 LAB — CBC WITH DIFFERENTIAL/PLATELET
BASOS ABS: 0 10*3/uL (ref 0–0.1)
Basophils Relative: 1 %
EOS PCT: 5 %
Eosinophils Absolute: 0.4 10*3/uL (ref 0–0.7)
HEMATOCRIT: 37.8 % (ref 35.0–47.0)
Hemoglobin: 12.7 g/dL (ref 12.0–16.0)
LYMPHS ABS: 2.6 10*3/uL (ref 1.0–3.6)
LYMPHS PCT: 32 %
MCH: 31.9 pg (ref 26.0–34.0)
MCHC: 33.7 g/dL (ref 32.0–36.0)
MCV: 94.8 fL (ref 80.0–100.0)
MONO ABS: 0.8 10*3/uL (ref 0.2–0.9)
Monocytes Relative: 10 %
NEUTROS ABS: 4.3 10*3/uL (ref 1.4–6.5)
Neutrophils Relative %: 52 %
PLATELETS: 446 10*3/uL — AB (ref 150–440)
RBC: 3.99 MIL/uL (ref 3.80–5.20)
RDW: 14.6 % — ABNORMAL HIGH (ref 11.5–14.5)
WBC: 8 10*3/uL (ref 3.6–11.0)

## 2018-01-15 LAB — BASIC METABOLIC PANEL
ANION GAP: 8 (ref 5–15)
BUN: 26 mg/dL — AB (ref 6–20)
CO2: 25 mmol/L (ref 22–32)
Calcium: 9.4 mg/dL (ref 8.9–10.3)
Chloride: 106 mmol/L (ref 101–111)
Creatinine, Ser: 0.76 mg/dL (ref 0.44–1.00)
GFR calc Af Amer: >60 mL/min (ref >60)
GLUCOSE: 75 mg/dL (ref 65–99)
Potassium: 4.3 mmol/L (ref 3.5–5.1)
Sodium: 139 mmol/L (ref 135–145)

## 2018-01-15 MED ORDER — CEPHALEXIN 500 MG PO CAPS: 500.0000 mg | ORAL_CAPSULE | Freq: Three times a day (TID) | ORAL | 0 refills | Status: AC

## 2018-01-15 MED ORDER — SODIUM CHLORIDE 0.9 % IV SOLN
1.0000 g | Freq: Once | INTRAVENOUS | Status: AC
  Administered 2018-01-15: 1 g via INTRAVENOUS
  Filled 2018-01-15: qty 10

## 2018-01-15 NOTE — ED Triage Notes (Signed)
Pt arrived via ems from [facility] with staff reporting that pt was more confused and delusional seeing people that were not there. Pt has also been on psych meds. Pt is A&Ox2 and not in distress at this current time.

## 2018-01-15 NOTE — ED Notes (Signed)
This RN spoke with pt's daughter, Raynelle FanningJulie, to inform her of pt's condition and disposition.

## 2018-01-15 NOTE — ED Notes (Signed)
Pt to be transported back to [facility] via ems.

## 2018-01-15 NOTE — ED Notes (Signed)
Report called to [facility]

## 2018-01-15 NOTE — Discharge Instructions (Addendum)
Please seek medical attention for any high fevers, chest pain, shortness of breath, change in behavior, persistent vomiting, bloody stool or any other new or concerning symptoms.  
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
## 2018-01-15 NOTE — ED Provider Notes (Signed)
[facility] Emergency Department Provider Note  ____________________________________________   I have reviewed the triage vital signs and the nursing notes.   HISTORY  Chief Complaint Altered Mental Status   History limited by and level 5 caveat due to: altered mental status   HPI Destiny Cole is a 67 y.o. female who presents to the emergency department today via EMS today from living facility because of concerns for increased confusion and hallucinations.  It appears that this is been going on for the past couple of seconds.  Apparently she is starting to frighten other residents.  Patient herself states she has had some headache and chest pain.  Per EMS the patient has had similar symptoms in the past been evaluated in emergency department in the past.   Per medical record review patient has a history of schizoaffective disorder, er visits for hallucination recently.  Past Medical History:  Diagnosis Date  . Cerebral palsy ([place])   . CHF (congestive heart failure) ([place])   . Diabetes mellitus without complication ([place])   . Hypertension     Patient Active Problem List   Diagnosis Date Noted  . Schizoaffective disorder ([place]) 12/26/2017  . Syncopal episodes 12/25/2017  . Syncope 12/25/2017  . Delirium 10/23/2017  . GERD (gastroesophageal reflux disease) 10/25/2016  . 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 Facial droop 04/06/2015  . Anxiety state 04/06/2015  . DKA, type 2 ([place]) 04/05/2015  . Hyperlipidemia 04/05/2015  . Physical deconditioning   . Diabetes mellitus without complication ([place])   . Cerebral palsy ([place])   . Hypertension     Past Surgical History:  Procedure Laterality Date  . ABDOMINAL HYSTERECTOMY    . cerbral palsy    . MOUTH SURGERY      Prior to Admission medications   Medication Sig Start Date End Date Taking? Authorizing Provider  acetaminophen (TYLENOL) 325 MG tablet Take 650 mg by mouth every 4 (four)  hours as needed for mild pain or fever.     [provider]  antiseptic oral rinse (BIOTENE) LIQD 15 mLs by Mouth Rinse route as needed for dry mouth.    [provider]  aspirin 81 MG chewable tablet Chew 81 mg by mouth daily.    [provider]  busPIRone (BUSPAR) 15 MG tablet Take 1 tablet (15 mg total) by mouth 2 (two) times daily. 01/11/18 02/10/18  Arfeen, Arlyce Harman, MD  cephALEXin (KEFLEX) 500 MG capsule Take 1 capsule (500 mg total) by mouth 2 (two) times daily. 01/09/18   Lavonia Drafts, MD  Cholecalciferol (VITAMIN D3) 50000 units CAPS Take 1 tablet by mouth every 30 (thirty) days. On the 24th.    [provider]  Cranberry 425 MG CAPS Take 1 tablet by mouth daily.    [provider]  Cyanocobalamin (B-12 COMPLIANCE INJECTION) 1000 MCG/ML KIT Inject 1,000 mcg as directed every 30 (thirty) days.    [provider]  diclofenac sodium (VOLTAREN) 1 % GEL Apply 2 g topically 2 (two) times daily. Apply to knees and right hand.    [provider]  gabapentin (NEURONTIN) 100 MG capsule Take 100 mg by mouth at bedtime.    [provider]  haloperidol (HALDOL) 5 MG tablet Take 1/2 tab at bed time and than one tab at bed time 01/11/18   Arfeen, Arlyce Harman, MD  hydrocortisone 2.5 % cream Apply 1 application topically 3 (three) times daily as needed (rash).    [provider]  hydrOXYzine (ATARAX/VISTARIL) 25 MG tablet Take 25 mg by mouth every 6 (six) hours as needed for itching.     [provider]  insulin glargine (LANTUS) 100 UNIT/ML injection Inject 45 Units into the skin 2 (two) times daily.    [provider]  lamoTRIgine (LAMICTAL) 150 MG tablet Take 1 tablet (150 mg total) by mouth every 12 (twelve) hours. 01/11/18   Arfeen, Arlyce Harman, MD  metFORMIN (GLUCOPHAGE) 1000 MG tablet Take 1,000 mg by mouth 2 (two) times daily with a meal.    [provider]  miconazole (MICOTIN) 2 % powder Apply topically daily as  needed for itching. Apply under abdomen in folds and red areas daily as needed.    [provider]  naproxen sodium (ALEVE) 220 MG tablet Take 220 mg by mouth 2 (two) times daily as needed (pain).     [provider]  potassium chloride (K-DUR) 10 MEQ tablet Take 1 tablet (10 mEq total) by mouth daily. 10/30/12   Jessee Avers, MD  venlafaxine XR (EFFEXOR-XR) 150 MG 24 hr capsule Take 1 capsule (150 mg total) by mouth daily with breakfast. 01/11/18   Arfeen, Arlyce Harman, MD    Allergies Promethazine; Bactrim [sulfamethoxazole-trimethoprim]; and Sulfa antibiotics  Family History  Problem Relation Age of Onset  . Breast cancer Mother   . Hypertension Mother   . Diabetes Mellitus II Maternal Aunt   . Post-traumatic stress disorder Brother     Social History Social History   Tobacco Use  . Smoking status: Never Smoker  . Smokeless tobacco: Never Used  Substance Use Topics  . Alcohol use: No    Alcohol/week: 0.0 oz  . Drug use: No    Review of Systems Unable to obtain reliable ROS secondary to AMS ____________________________________________   PHYSICAL EXAM:  VITAL SIGNS: ED Triage Vitals  Enc Vitals Group     BP 166/94     Pulse 100     Resp 20     Temp 97.6     Temp src      SpO2 96   Constitutional: Awake, alert. Confused.  Eyes: Conjunctivae are normal.  ENT      Head: Normocephalic and atraumatic.      Nose: No congestion/rhinnorhea.      Mouth/Throat: Mucous membranes are moist.      Neck: No stridor. Hematological/Lymphatic/Immunilogical: No cervical lymphadenopathy. Cardiovascular: Normal rate, regular rhythm.  No murmurs, rubs, or gallops.  Respiratory: Normal respiratory effort without tachypnea nor retractions. Breath sounds are clear and equal bilaterally. No wheezes/rales/rhonchi. Gastrointestinal: Soft and non tender. No rebound. No guarding.  Genitourinary: Deferred Musculoskeletal: Normal range of motion in all extremities. No lower  extremity edema. Neurologic:  Awake and alert. Not completely oriented.  Skin:  Skin is warm, dry and intact. No rash noted.  ____________________________________________    LABS (pertinent positives/negatives)  CBC wbc 8.0, hgb 12.7, plt 446 BMP wnl except bun 26 UA hazy, trace leukocytes, wbc 6-10  ____________________________________________   EKG  None  ____________________________________________    RADIOLOGY  None  ____________________________________________   PROCEDURES  Procedures  ____________________________________________   INITIAL IMPRESSION / ASSESSMENT AND PLAN / ED COURSE  Pertinent labs & imaging results that were available during my care of the patient were reviewed by me and considered in my medical decision making (see chart for details).   Patient presented to the emergency department today brought in from living facility because of concerns for altered mental status.  Differential would be  broad.  Work-up is concerning for urinary tract infection.  This could be altering patient's mentation.  No leukocytosis or signs of sepsis.  Will give dose of antibiotics here and discharged with further antibiotics.   ____________________________________________   FINAL CLINICAL IMPRESSION(S) / ED DIAGNOSES  Final diagnoses:  Lower urinary tract infectious disease     Note: This dictation was prepared with Dragon dictation. Any transcriptional errors that result from this process are unintentional     Nance Pear, MD 01/16/18 1535

## 2018-01-16 DIAGNOSIS — F062 Psychotic disorder with delusions due to known physiological condition: Secondary | ICD-10-CM | POA: Diagnosis not present

## 2018-01-17 ENCOUNTER — Other Ambulatory Visit (HOSPITAL_COMMUNITY): Payer: Self-pay | Admitting: Psychiatry

## 2018-01-17 LAB — URINE CULTURE

## 2018-01-17 NOTE — Telephone Encounter (Signed)
Patient is not on Zyprexa. She is taking Haldol, Lamictal, and Effexor

## 2018-01-17 NOTE — Telephone Encounter (Signed)
Patient went to the emergency room and found to have UTI and given antibiotic.  It is unclear why she is taking Zyprexa and Haldol together.  Please call patient and verify her antipsychotic dose and medication.

## 2018-01-18 NOTE — Telephone Encounter (Signed)
Her behavior could be due to underlying UTI.  She was given medicine for UTI.  If her behavior continued to persist then we can try different medication.  Until then continue Haldol as prescribed.

## 2018-01-18 NOTE — Telephone Encounter (Signed)
I called patients daughter and let her know that if the behavior continues to give me a call back and I will talk to the doctor. She was in agreement with this plan

## 2018-02-01 DIAGNOSIS — E1142 Type 2 diabetes mellitus with diabetic polyneuropathy: Secondary | ICD-10-CM | POA: Diagnosis not present

## 2018-02-01 DIAGNOSIS — F259 Schizoaffective disorder, unspecified: Secondary | ICD-10-CM | POA: Diagnosis not present

## 2018-02-01 DIAGNOSIS — I209 Angina pectoris, unspecified: Secondary | ICD-10-CM | POA: Diagnosis not present

## 2018-02-01 DIAGNOSIS — F39 Unspecified mood [affective] disorder: Secondary | ICD-10-CM | POA: Diagnosis not present

## 2018-02-01 DIAGNOSIS — I69351 Hemiplegia and hemiparesis following cerebral infarction affecting right dominant side: Secondary | ICD-10-CM | POA: Diagnosis not present

## 2018-02-01 DIAGNOSIS — M159 Polyosteoarthritis, unspecified: Secondary | ICD-10-CM | POA: Diagnosis not present

## 2018-02-01 DIAGNOSIS — G809 Cerebral palsy, unspecified: Secondary | ICD-10-CM | POA: Diagnosis not present

## 2018-02-16 ENCOUNTER — Ambulatory Visit (HOSPITAL_COMMUNITY): Payer: Self-pay | Admitting: Psychiatry

## 2018-03-30 DIAGNOSIS — F259 Schizoaffective disorder, unspecified: Secondary | ICD-10-CM

## 2018-03-30 DIAGNOSIS — M1990 Unspecified osteoarthritis, unspecified site: Secondary | ICD-10-CM

## 2018-03-30 DIAGNOSIS — G809 Cerebral palsy, unspecified: Secondary | ICD-10-CM

## 2018-03-30 DIAGNOSIS — I25110 Atherosclerotic heart disease of native coronary artery with unstable angina pectoris: Secondary | ICD-10-CM

## 2018-03-30 DIAGNOSIS — E1140 Type 2 diabetes mellitus with diabetic neuropathy, unspecified: Secondary | ICD-10-CM

## 2018-03-30 DIAGNOSIS — G8191 Hemiplegia, unspecified affecting right dominant side: Secondary | ICD-10-CM

## 2018-05-11 DIAGNOSIS — B351 Tinea unguium: Secondary | ICD-10-CM | POA: Diagnosis not present

## 2018-06-07 DIAGNOSIS — E1142 Type 2 diabetes mellitus with diabetic polyneuropathy: Secondary | ICD-10-CM

## 2018-06-07 DIAGNOSIS — M159 Polyosteoarthritis, unspecified: Secondary | ICD-10-CM

## 2018-06-07 DIAGNOSIS — I69351 Hemiplegia and hemiparesis following cerebral infarction affecting right dominant side: Secondary | ICD-10-CM

## 2018-06-07 DIAGNOSIS — F39 Unspecified mood [affective] disorder: Secondary | ICD-10-CM | POA: Diagnosis not present

## 2018-06-07 DIAGNOSIS — F259 Schizoaffective disorder, unspecified: Secondary | ICD-10-CM

## 2018-07-18 DIAGNOSIS — B351 Tinea unguium: Secondary | ICD-10-CM | POA: Diagnosis not present

## 2018-07-18 DIAGNOSIS — F39 Unspecified mood [affective] disorder: Secondary | ICD-10-CM

## 2018-07-18 DIAGNOSIS — F259 Schizoaffective disorder, unspecified: Secondary | ICD-10-CM

## 2018-07-18 DIAGNOSIS — E1140 Type 2 diabetes mellitus with diabetic neuropathy, unspecified: Secondary | ICD-10-CM

## 2018-07-18 DIAGNOSIS — G809 Cerebral palsy, unspecified: Secondary | ICD-10-CM

## 2018-07-18 DIAGNOSIS — M1990 Unspecified osteoarthritis, unspecified site: Secondary | ICD-10-CM

## 2018-08-27 DIAGNOSIS — N3001 Acute cystitis with hematuria: Secondary | ICD-10-CM | POA: Diagnosis not present

## 2018-10-04 DIAGNOSIS — F259 Schizoaffective disorder, unspecified: Secondary | ICD-10-CM | POA: Diagnosis not present

## 2018-10-04 DIAGNOSIS — I69351 Hemiplegia and hemiparesis following cerebral infarction affecting right dominant side: Secondary | ICD-10-CM

## 2018-10-04 DIAGNOSIS — F39 Unspecified mood [affective] disorder: Secondary | ICD-10-CM | POA: Diagnosis not present

## 2018-10-04 DIAGNOSIS — E1140 Type 2 diabetes mellitus with diabetic neuropathy, unspecified: Secondary | ICD-10-CM | POA: Diagnosis not present

## 2018-10-30 DIAGNOSIS — R509 Fever, unspecified: Secondary | ICD-10-CM

## 2018-11-07 DEATH — deceased
